# Patient Record
Sex: Male | Born: 1935 | Race: White | Hispanic: No | Marital: Married | Smoking: Never smoker
Health system: Southern US, Community
[De-identification: ages and names within clinical notes are randomized; demographics above are authoritative.]

## PROBLEM LIST (undated history)

## (undated) DIAGNOSIS — K219 Gastro-esophageal reflux disease without esophagitis: Secondary | ICD-10-CM

## (undated) DIAGNOSIS — H9192 Unspecified hearing loss, left ear: Secondary | ICD-10-CM

## (undated) DIAGNOSIS — I10 Essential (primary) hypertension: Secondary | ICD-10-CM

## (undated) DIAGNOSIS — M1990 Unspecified osteoarthritis, unspecified site: Secondary | ICD-10-CM

## (undated) DIAGNOSIS — N400 Enlarged prostate without lower urinary tract symptoms: Secondary | ICD-10-CM

## (undated) DIAGNOSIS — J189 Pneumonia, unspecified organism: Secondary | ICD-10-CM

## (undated) DIAGNOSIS — R011 Cardiac murmur, unspecified: Secondary | ICD-10-CM

## (undated) DIAGNOSIS — H9190 Unspecified hearing loss, unspecified ear: Secondary | ICD-10-CM

## (undated) DIAGNOSIS — Z952 Presence of prosthetic heart valve: Secondary | ICD-10-CM

## (undated) DIAGNOSIS — E785 Hyperlipidemia, unspecified: Secondary | ICD-10-CM

## (undated) DIAGNOSIS — J069 Acute upper respiratory infection, unspecified: Secondary | ICD-10-CM

## (undated) HISTORY — DX: Presence of prosthetic heart valve: Z95.2

## (undated) HISTORY — PX: JOINT REPLACEMENT: SHX530

## (undated) SURGERY — ARTHROPLASTY, KNEE, TOTAL
Anesthesia: Choice | Laterality: Right

---

## 1998-03-15 ENCOUNTER — Other Ambulatory Visit: Admission: RE | Admit: 1998-03-15 | Discharge: 1998-03-15 | Payer: Self-pay | Admitting: Family Medicine

## 2003-04-22 ENCOUNTER — Encounter (INDEPENDENT_AMBULATORY_CARE_PROVIDER_SITE_OTHER): Payer: Self-pay | Admitting: *Deleted

## 2003-04-22 ENCOUNTER — Ambulatory Visit (HOSPITAL_COMMUNITY): Admission: RE | Admit: 2003-04-22 | Discharge: 2003-04-22 | Payer: Self-pay | Admitting: Gastroenterology

## 2004-05-23 ENCOUNTER — Encounter: Admission: RE | Admit: 2004-05-23 | Discharge: 2004-05-23 | Payer: Self-pay | Admitting: Internal Medicine

## 2004-06-23 ENCOUNTER — Emergency Department (HOSPITAL_COMMUNITY): Admission: EM | Admit: 2004-06-23 | Discharge: 2004-06-23 | Payer: Self-pay | Admitting: Emergency Medicine

## 2004-09-26 ENCOUNTER — Encounter: Admission: RE | Admit: 2004-09-26 | Discharge: 2004-09-26 | Payer: Self-pay | Admitting: Internal Medicine

## 2005-10-02 ENCOUNTER — Ambulatory Visit: Admission: RE | Admit: 2005-10-02 | Discharge: 2005-10-02 | Payer: Self-pay | Admitting: Internal Medicine

## 2006-10-14 ENCOUNTER — Encounter: Admission: RE | Admit: 2006-10-14 | Discharge: 2006-10-14 | Payer: Self-pay | Admitting: Internal Medicine

## 2007-08-28 ENCOUNTER — Emergency Department (HOSPITAL_COMMUNITY): Admission: EM | Admit: 2007-08-28 | Discharge: 2007-08-28 | Payer: Self-pay | Admitting: Emergency Medicine

## 2011-01-19 NOTE — Op Note (Signed)
   NAME:  Andrew Bonilla, NIEMANN NO.:  [id number]   MEDICAL RECORD NO.:  [id number]                   PATIENT TYPE:  AMB   LOCATION:  ENDO                                 FACILITY:  [facility]   PHYSICIAN:  Danise Edge, M.D.                DATE OF BIRTH:  1933/07/21   DATE OF PROCEDURE:  04/22/2003  DATE OF DISCHARGE:                                 OPERATIVE REPORT   PROCEDURE PERFORMED:  Screening colonoscopy.   ENDOSCOPIST:  Charolett Bumpers, M.D.   INDICATIONS FOR PROCEDURE:  Mr. Gilliam Hawkes is a 77 year old [place] male  born 1933/03/30.  Mr. Maclin is scheduled to undergo his first  screening colonoscopy with polypectomy to prevent colon cancer.   PREMEDICATION:  Versed 5 mg, Demerol 50 mg.   DESCRIPTION OF PROCEDURE:  After obtaining informed consent, Mr. Linch was  placed in the left lateral decubitus position.  I administered intravenous  Demerol and intravenous Versed to achieve conscious sedation for the  procedure.  The patient's blood pressure, oxygen saturations and cardiac  rhythm were monitored throughout the procedure and documented in the medical  record.   Anal inspection was normal.  Digital rectal exam revealed a nonnodular  prostate.  The Olympus adult colonoscope was introduced into the rectum and  easily advanced to the cecum.  Colonic preparation for the exam today was  excellent.   Rectum:  Two diminutive sessile polyps were removed from the midrectum with  the cold biopsy forceps and submitted for pathological interpretation.   Sigmoid colon and descending colon:  Normal.   Splenic flexure:  Normal.   Transverse colon:  Normal.   Hepatic flexure:  Normal.   Ascending colon:  Normal.   Cecum and ileocecal valve:  Normal.    ASSESSMENT:  Two diminutive polyps removed from the rectum; otherwise normal  screening proctocolonoscopy to the cecum.   RECOMMENDATIONS:  Repeat colonoscopy in five years if rectal polyps  return  neoplastic pathologically.                                               Danise Edge, M.D.    MJ/MEDQ  D:  04/22/2003  T:  04/22/2003  Job:  [id number]   cc:   Georgann Housekeeper, M.D.  [address]  [place]  [place] [address]  Fax: [phone]

## 2011-09-21 DIAGNOSIS — L570 Actinic keratosis: Secondary | ICD-10-CM | POA: Diagnosis not present

## 2011-09-21 DIAGNOSIS — B351 Tinea unguium: Secondary | ICD-10-CM | POA: Diagnosis not present

## 2011-09-21 DIAGNOSIS — L821 Other seborrheic keratosis: Secondary | ICD-10-CM | POA: Diagnosis not present

## 2011-09-26 DIAGNOSIS — B351 Tinea unguium: Secondary | ICD-10-CM | POA: Diagnosis not present

## 2011-09-26 DIAGNOSIS — Z79899 Other long term (current) drug therapy: Secondary | ICD-10-CM | POA: Diagnosis not present

## 2011-12-04 DIAGNOSIS — L820 Inflamed seborrheic keratosis: Secondary | ICD-10-CM | POA: Diagnosis not present

## 2011-12-04 DIAGNOSIS — B351 Tinea unguium: Secondary | ICD-10-CM | POA: Diagnosis not present

## 2011-12-04 DIAGNOSIS — L570 Actinic keratosis: Secondary | ICD-10-CM | POA: Diagnosis not present

## 2011-12-13 DIAGNOSIS — N529 Male erectile dysfunction, unspecified: Secondary | ICD-10-CM | POA: Diagnosis not present

## 2011-12-13 DIAGNOSIS — E782 Mixed hyperlipidemia: Secondary | ICD-10-CM | POA: Diagnosis not present

## 2011-12-13 DIAGNOSIS — I6529 Occlusion and stenosis of unspecified carotid artery: Secondary | ICD-10-CM | POA: Diagnosis not present

## 2011-12-13 DIAGNOSIS — J069 Acute upper respiratory infection, unspecified: Secondary | ICD-10-CM | POA: Diagnosis not present

## 2011-12-13 DIAGNOSIS — N400 Enlarged prostate without lower urinary tract symptoms: Secondary | ICD-10-CM | POA: Diagnosis not present

## 2011-12-13 DIAGNOSIS — Z1331 Encounter for screening for depression: Secondary | ICD-10-CM | POA: Diagnosis not present

## 2011-12-13 DIAGNOSIS — I10 Essential (primary) hypertension: Secondary | ICD-10-CM | POA: Diagnosis not present

## 2011-12-13 DIAGNOSIS — K219 Gastro-esophageal reflux disease without esophagitis: Secondary | ICD-10-CM | POA: Diagnosis not present

## 2011-12-14 ENCOUNTER — Inpatient Hospital Stay (HOSPITAL_COMMUNITY)
Admission: EM | Admit: 2011-12-14 | Discharge: 2011-12-18 | DRG: 287 | Disposition: A | Discharge status: Home / Self Care | Payer: Medicare Other | Source: Ambulatory Visit | Attending: Internal Medicine | Admitting: Internal Medicine

## 2011-12-14 ENCOUNTER — Encounter (HOSPITAL_COMMUNITY): Payer: Self-pay | Admitting: *Deleted

## 2011-12-14 DIAGNOSIS — Z952 Presence of prosthetic heart valve: Secondary | ICD-10-CM | POA: Diagnosis present

## 2011-12-14 DIAGNOSIS — K5900 Constipation, unspecified: Secondary | ICD-10-CM | POA: Diagnosis present

## 2011-12-14 DIAGNOSIS — I10 Essential (primary) hypertension: Secondary | ICD-10-CM | POA: Diagnosis present

## 2011-12-14 DIAGNOSIS — E785 Hyperlipidemia, unspecified: Secondary | ICD-10-CM | POA: Diagnosis present

## 2011-12-14 DIAGNOSIS — R55 Syncope and collapse: Principal | ICD-10-CM | POA: Diagnosis present

## 2011-12-14 DIAGNOSIS — R5383 Other fatigue: Secondary | ICD-10-CM | POA: Diagnosis not present

## 2011-12-14 DIAGNOSIS — E782 Mixed hyperlipidemia: Secondary | ICD-10-CM | POA: Diagnosis present

## 2011-12-14 DIAGNOSIS — R519 Headache, unspecified: Secondary | ICD-10-CM

## 2011-12-14 DIAGNOSIS — I359 Nonrheumatic aortic valve disorder, unspecified: Secondary | ICD-10-CM | POA: Diagnosis present

## 2011-12-14 DIAGNOSIS — G319 Degenerative disease of nervous system, unspecified: Secondary | ICD-10-CM | POA: Diagnosis not present

## 2011-12-14 DIAGNOSIS — H9190 Unspecified hearing loss, unspecified ear: Secondary | ICD-10-CM | POA: Diagnosis present

## 2011-12-14 DIAGNOSIS — R5381 Other malaise: Secondary | ICD-10-CM | POA: Diagnosis not present

## 2011-12-14 DIAGNOSIS — R51 Headache: Secondary | ICD-10-CM

## 2011-12-14 DIAGNOSIS — R110 Nausea: Secondary | ICD-10-CM | POA: Diagnosis not present

## 2011-12-14 DIAGNOSIS — Z8249 Family history of ischemic heart disease and other diseases of the circulatory system: Secondary | ICD-10-CM

## 2011-12-14 DIAGNOSIS — R404 Transient alteration of awareness: Secondary | ICD-10-CM | POA: Diagnosis not present

## 2011-12-14 DIAGNOSIS — R4789 Other speech disturbances: Secondary | ICD-10-CM | POA: Diagnosis not present

## 2011-12-14 HISTORY — DX: Hyperlipidemia, unspecified: E78.5

## 2011-12-14 HISTORY — DX: Unspecified hearing loss, unspecified ear: H91.90

## 2011-12-14 HISTORY — DX: Essential (primary) hypertension: I10

## 2011-12-14 NOTE — ED Notes (Signed)
Pt from home.  Pt has been constipated, took a laxative today-had a BM.  Had abdominal pain.  Was walking down stairs and woke up face down on the floor.  Pt was later using the bathroom and had another syncopal episode.  On EMS arrival, pt turned grey, diaphoretic-near syncopal when pt stood up-unable to obtain BP at that time.  324 asa given.  Denies pain-reports nausea and dizziness when standing.  Vitals:  145/53, HR 70.

## 2011-12-15 ENCOUNTER — Encounter (HOSPITAL_COMMUNITY): Payer: Self-pay | Admitting: *Deleted

## 2011-12-15 ENCOUNTER — Emergency Department (HOSPITAL_COMMUNITY): Payer: Medicare Other

## 2011-12-15 DIAGNOSIS — R519 Headache, unspecified: Secondary | ICD-10-CM

## 2011-12-15 DIAGNOSIS — R05 Cough: Secondary | ICD-10-CM | POA: Diagnosis not present

## 2011-12-15 DIAGNOSIS — R51 Headache: Secondary | ICD-10-CM

## 2011-12-15 DIAGNOSIS — H9190 Unspecified hearing loss, unspecified ear: Secondary | ICD-10-CM | POA: Diagnosis not present

## 2011-12-15 DIAGNOSIS — K5900 Constipation, unspecified: Secondary | ICD-10-CM | POA: Diagnosis present

## 2011-12-15 DIAGNOSIS — R110 Nausea: Secondary | ICD-10-CM | POA: Diagnosis not present

## 2011-12-15 DIAGNOSIS — I10 Essential (primary) hypertension: Secondary | ICD-10-CM | POA: Diagnosis not present

## 2011-12-15 DIAGNOSIS — Z8249 Family history of ischemic heart disease and other diseases of the circulatory system: Secondary | ICD-10-CM | POA: Diagnosis not present

## 2011-12-15 DIAGNOSIS — I359 Nonrheumatic aortic valve disorder, unspecified: Secondary | ICD-10-CM | POA: Diagnosis not present

## 2011-12-15 DIAGNOSIS — R55 Syncope and collapse: Principal | ICD-10-CM | POA: Diagnosis present

## 2011-12-15 DIAGNOSIS — R059 Cough, unspecified: Secondary | ICD-10-CM | POA: Diagnosis not present

## 2011-12-15 DIAGNOSIS — G319 Degenerative disease of nervous system, unspecified: Secondary | ICD-10-CM | POA: Diagnosis not present

## 2011-12-15 DIAGNOSIS — Z952 Presence of prosthetic heart valve: Secondary | ICD-10-CM | POA: Diagnosis present

## 2011-12-15 DIAGNOSIS — E782 Mixed hyperlipidemia: Secondary | ICD-10-CM | POA: Diagnosis present

## 2011-12-15 DIAGNOSIS — R4789 Other speech disturbances: Secondary | ICD-10-CM | POA: Diagnosis not present

## 2011-12-15 DIAGNOSIS — E785 Hyperlipidemia, unspecified: Secondary | ICD-10-CM | POA: Diagnosis not present

## 2011-12-15 LAB — URINALYSIS, ROUTINE W REFLEX MICROSCOPIC
Glucose, UA: NEGATIVE mg/dL
Leukocytes, UA: NEGATIVE
pH: 6.5 (ref 5.0–8.0)

## 2011-12-15 LAB — POCT I-STAT, CHEM 8
Chloride: 99 mEq/L (ref 96–112)
Creatinine, Ser: 1 mg/dL (ref 0.50–1.35)
Glucose, Bld: 135 mg/dL — ABNORMAL HIGH (ref 70–99)
Potassium: 4.1 mEq/L (ref 3.5–5.1)

## 2011-12-15 LAB — COMPREHENSIVE METABOLIC PANEL
AST: 27 U/L (ref 0–37)
Albumin: 4.1 g/dL (ref 3.5–5.2)
Calcium: 9.7 mg/dL (ref 8.4–10.5)
Chloride: 95 mEq/L — ABNORMAL LOW (ref 96–112)
Creatinine, Ser: 1.05 mg/dL (ref 0.50–1.35)

## 2011-12-15 LAB — CBC
MCH: 30.9 pg (ref 26.0–34.0)
MCH: 31.2 pg (ref 26.0–34.0)
MCV: 87 fL (ref 78.0–100.0)
MCV: 88 fL (ref 78.0–100.0)
Platelets: 271 10*3/uL (ref 150–400)
Platelets: 281 10*3/uL (ref 150–400)
RBC: 4.24 MIL/uL (ref 4.22–5.81)
RDW: 13.1 % (ref 11.5–15.5)
RDW: 13.4 % (ref 11.5–15.5)
WBC: 14.9 10*3/uL — ABNORMAL HIGH (ref 4.0–10.5)

## 2011-12-15 LAB — OCCULT BLOOD, POC DEVICE: Fecal Occult Bld: POSITIVE

## 2011-12-15 LAB — BASIC METABOLIC PANEL
CO2: 25 mEq/L (ref 19–32)
Calcium: 9.2 mg/dL (ref 8.4–10.5)
Creatinine, Ser: 0.84 mg/dL (ref 0.50–1.35)

## 2011-12-15 MED ORDER — ENOXAPARIN SODIUM 40 MG/0.4ML ~~LOC~~ SOLN
40.0000 mg | SUBCUTANEOUS | Status: DC
  Administered 2011-12-15: 40 mg via SUBCUTANEOUS
  Filled 2011-12-15 (×2): qty 0.4

## 2011-12-15 MED ORDER — BENZONATATE 100 MG PO CAPS
100.0000 mg | ORAL_CAPSULE | Freq: Three times a day (TID) | ORAL | Status: DC | PRN
  Administered 2011-12-15 – 2011-12-17 (×5): 100 mg via ORAL
  Filled 2011-12-15 (×6): qty 1

## 2011-12-15 MED ORDER — HYDROCHLOROTHIAZIDE 12.5 MG PO CAPS
12.5000 mg | ORAL_CAPSULE | Freq: Every day | ORAL | Status: AC
  Administered 2011-12-15 – 2011-12-16 (×2): 12.5 mg via ORAL
  Filled 2011-12-15 (×2): qty 1

## 2011-12-15 MED ORDER — POTASSIUM CHLORIDE IN NACL 20-0.9 MEQ/L-% IV SOLN
INTRAVENOUS | Status: DC
  Administered 2011-12-15: 05:00:00 via INTRAVENOUS
  Filled 2011-12-15 (×2): qty 1000

## 2011-12-15 MED ORDER — BENZONATATE 100 MG PO CAPS: 100.0000 mg | ORAL_CAPSULE | Freq: Three times a day (TID) | ORAL | Status: DC

## 2011-12-15 MED ORDER — GUAIFENESIN-CODEINE 100-10 MG/5ML PO SOLN
10.0000 mL | ORAL | Status: DC | PRN
  Administered 2011-12-15: 10 mL via ORAL
  Filled 2011-12-15 (×3): qty 5

## 2011-12-15 MED ORDER — MAGNESIUM CITRATE PO SOLN
1.0000 | Freq: Once | ORAL | Status: AC
  Administered 2011-12-15: 1 via ORAL
  Filled 2011-12-15: qty 296

## 2011-12-15 MED ORDER — SODIUM CHLORIDE 0.9 % IV SOLN
INTRAVENOUS | Status: DC
  Administered 2011-12-15 (×2): via INTRAVENOUS

## 2011-12-15 MED ORDER — GUAIFENESIN-CODEINE 100-10 MG/5ML PO SOLN: 10.0000 mL | Freq: Four times a day (QID) | ORAL | Status: AC | PRN

## 2011-12-15 MED ORDER — POLYETHYLENE GLYCOL 3350 17 G PO PACK
17.0000 g | PACK | Freq: Every day | ORAL | Status: DC
  Administered 2011-12-15 – 2011-12-18 (×3): 17 g via ORAL
  Filled 2011-12-15 (×4): qty 1

## 2011-12-15 MED ORDER — IRBESARTAN 75 MG PO TABS
75.0000 mg | ORAL_TABLET | Freq: Every day | ORAL | Status: DC
  Administered 2011-12-15 – 2011-12-16 (×2): 75 mg via ORAL
  Filled 2011-12-15 (×3): qty 1

## 2011-12-15 MED ORDER — VALSARTAN-HYDROCHLOROTHIAZIDE 80-12.5 MG PO TABS
1.0000 | ORAL_TABLET | Freq: Every day | ORAL | Status: DC
Start: 2011-12-15 — End: 2011-12-15

## 2011-12-15 MED ORDER — DOCUSATE SODIUM 100 MG PO CAPS
100.0000 mg | ORAL_CAPSULE | Freq: Every day | ORAL | Status: DC
  Administered 2011-12-16 – 2011-12-17 (×3): 100 mg via ORAL
  Filled 2011-12-15 (×5): qty 1

## 2011-12-15 MED ORDER — ATORVASTATIN CALCIUM 40 MG PO TABS
40.0000 mg | ORAL_TABLET | Freq: Every day | ORAL | Status: DC
Start: 2011-12-15 — End: 2011-12-18
  Administered 2011-12-15 – 2011-12-18 (×4): 40 mg via ORAL
  Filled 2011-12-15 (×4): qty 1

## 2011-12-15 MED ORDER — POLYETHYLENE GLYCOL 3350 17 G PO PACK: 17.0000 g | PACK | Freq: Every day | ORAL | Status: AC

## 2011-12-15 MED ORDER — BISACODYL 10 MG RE SUPP: 10.0000 mg | RECTAL | Status: DC | PRN

## 2011-12-15 MED ORDER — PANTOPRAZOLE SODIUM 40 MG PO TBEC
40.0000 mg | DELAYED_RELEASE_TABLET | Freq: Every day | ORAL | Status: DC
  Administered 2011-12-15 – 2011-12-18 (×4): 40 mg via ORAL
  Filled 2011-12-15 (×4): qty 1

## 2011-12-15 NOTE — Progress Notes (Signed)
Admitted pt to rm [id number] from ED via stretcher, pt alert and oriented, denied pain at this time. Oriented to room, call bell placed within reach, SR on heart monitor, admission assessment done, orders carried out, safety precaution initiated, bed alarm on. Family at bedside. Will continue to monitor.

## 2011-12-15 NOTE — Progress Notes (Signed)
Patient seen and examined this morning, admitted by Dr. Haroldine Laws on 12/15/2011, for syncope - CT head was negative, POC troponin was negative, chest x-ray was negative - Ordered a 2-D echocardiogram given recurrent syncope with constipation - 2-D echo revealed mild concentric LVH, EF 50-55% with male basal to mid inferior hypokinesis, severe aortic stenosis, calculated AVA 0.9-1 cm2 - Discussed in detail with the patient and his wife in the room. Patient never had any cardiac workup in the past and with no cardiac history. Patient was not aware of aortic stenosis before.  - Discussed with Dr. Rennis Golden, who graciously accepted to evaluate the patient further recommendations.    Orma Cheetham M.D. [facility] 12/15/2011, 1:07 PM  Pager: [phone]

## 2011-12-15 NOTE — H&P (Signed)
PCP:   Georgann Housekeeper, MD, MD   Chief Complaint:  Syncope and constipation  HPI: This is a 78 year old gentleman who states that he does have a history of bowel issues. However he's been severely constipated in the past 3 days. Today he spent all day trying to go to bathroom without success, he syncopized while while attempting to go. His wife later went out and bought a suppository which he used with great success. He went to sleep and woke 3 hours later, while doing chores around the house he again syncopized. He wasn't out for any significant length of time. The patient denies any cough, palpitations, lightheadedness. In addition the patient has a significant persistent cough. It is nonproductive. This has been present for approximately 4 weeks. He does not report of any fevers, chills. He has had nausea associated with this. His wife brought him to the ER after he syncopized second time. History provided by the patient and his wife and O2 for a bedside.  Review of Systems: Positives bolded   anorexia, fever, weight loss,, vision loss, decreased hearing, hoarseness, chest pain, syncope, dyspnea on exertion, peripheral edema, balance deficits, hemoptysis, abdominal pain, melena, hematochezia, severe indigestion/heartburn, hematuria, incontinence, genital sores, muscle weakness, suspicious skin lesions, transient blindness, difficulty walking, depression, unusual weight change, abnormal bleeding, enlarged lymph nodes, angioedema, and breast masses.  Past Medical History: Past Medical History  Diagnosis Date  . Hyperlipemia   . Hypertension   . Constipation   . Hard of hearing   . Generalized headaches    History reviewed. No pertinent past surgical history.  Medications: Prior to Admission medications   Medication Sig Start Date End Date Taking? Authorizing Provider  atorvastatin (LIPITOR) 40 MG tablet Take 40 mg by mouth daily.   Yes Historical Provider, MD  bisacodyl (DULCOLAX) 10 MG  suppository Place 10 mg rectally as needed.   Yes Historical Provider, MD  esomeprazole (NEXIUM) 40 MG capsule Take 40 mg by mouth daily before breakfast.   Yes Historical Provider, MD  Sennosides (EX-LAX PO) Take 1 tablet by mouth daily.   Yes Historical Provider, MD  valsartan-hydrochlorothiazide (DIOVAN-HCT) 80-12.5 MG per tablet Take 1 tablet by mouth daily.   Yes Historical Provider, MD  azithromycin (ZITHROMAX) 250 MG tablet Take 250 mg by mouth daily.    Historical Provider, MD    Allergies:  No Known Allergies  Social History:  reports that he has quit smoking. He does not have any smokeless tobacco history on file. He reports that he does not drink alcohol or use illicit drugs.  Family History: Family History  Problem Relation Age of Onset  . Coronary artery disease    . Colon cancer      Physical Exam: Filed Vitals:   12/14/11 2356 12/15/11 0030 12/15/11 0033 12/15/11 0035  BP: 141/52 101/71 120/62 119/66  Pulse: 72 63 85 88  Temp: 97.5 F (36.4 C)     TempSrc: Oral     Resp: 19     SpO2: 97% 97% 95% 100%    General:  Alert and oriented times three, well developed and nourished, no acute distress Eyes: PERRLA, pink conjunctiva, no scleral icterus ENT: Moist oral mucosa, neck supple, no thyromegaly Lungs: clear to ascultation, no wheeze, no crackles, no use of accessory muscles Cardiovascular: regular rate and rhythm, no regurgitation, no gallops, no murmurs. No carotid bruits, no JVD Abdomen: soft, positive BS, non-tender, non-distended, no organomegaly, not an acute abdomen GU: not examined Neuro: CN II - XII  grossly intact, sensation intact Musculoskeletal: strength 5/5 all extremities, no clubbing, cyanosis or edema Skin: no rash, no subcutaneous crepitation, no decubitus Psych: appropriate patient   Labs on Admission:   [facility] 12/15/11 0105 12/15/11 0049  NA 136 133*  K 4.1 4.0  CL 99 95*  CO2 -- 27  GLUCOSE 135* 135*  BUN 17 16  CREATININE 1.00  1.05  CALCIUM -- 9.7  MG -- --  PHOS -- --    Basename 12/15/11 0049  AST 27  ALT 29  ALKPHOS 64  BILITOT 1.0  PROT 7.3  ALBUMIN 4.1   No results found for this basename: LIPASE:2,AMYLASE:2 in the last 72 hours  Basename 12/15/11 0105 12/15/11 0049  WBC -- 14.9*  NEUTROABS -- --  HGB 15.0 14.4  HCT 44.0 40.2  MCV -- 87.0  PLT -- 271   No results found for this basename: CKTOTAL:3,CKMB:3,CKMBINDEX:3,TROPONINI:3 in the last 72 hours No components found with this basename: POCBNP:3 No results found for this basename: DDIMER:2 in the last 72 hours No results found for this basename: HGBA1C:2 in the last 72 hours No results found for this basename: CHOL:2,HDL:2,LDLCALC:2,TRIG:2,CHOLHDL:2,LDLDIRECT:2 in the last 72 hours No results found for this basename: TSH,T4TOTAL,FREET3,T3FREE,THYROIDAB in the last 72 hours No results found for this basename: VITAMINB12:2,FOLATE:2,FERRITIN:2,TIBC:2,IRON:2,RETICCTPCT:2 in the last 72 hours  Micro Results: No results found for this or any previous visit (from the past 240 hour(s)).   Radiological Exams on Admission: Dg Abd Acute W/chest  12/15/2011  *RADIOLOGY REPORT*  Clinical Data: Constipation, nausea, syncope  ACUTE ABDOMEN SERIES (ABDOMEN 2 VIEW & CHEST 1 VIEW)  Comparison: None.  Findings: Lungs are essentially clear. No pleural effusion or pneumothorax.  Cardiomediastinal silhouette is within normal limits.  Nonobstructive bowel gas pattern.  No evidence of free air under the diaphragm on the upright view.  Mild degenerative changes of the visualized thoracolumbar spine.  IMPRESSION: No evidence of acute cardiopulmonary disease.  No evidence of small bowel obstruction or free air.  Original Report Authenticated By: Charline Bills, M.D.    Assessment/Plan Present on Admission:  .Syncope and collapse Severe constipation Admit to telemetry Likely due to patient's severe constipation and persistent viral illness Magnesium citrate and  MiraLAX ordered and scheduled  Enema when necessary CT head ordered and results are pending  urinalysis also ordered results pending Hypertension Hyperlipidemia Hard of hearing Stable resume home medications   Full code DVT prophylaxis T9/Dr. Nadara Mustard, Ahyan Kreeger 12/15/2011, 3:24 AM

## 2011-12-15 NOTE — Progress Notes (Signed)
  Echocardiogram 2D Echocardiogram has been performed.  Andrew Bonilla 12/15/2011, 12:07 PM

## 2011-12-15 NOTE — Consult Note (Signed)
Reason for Consult: Syncope  Requesting Physician: [facility]  HPI: This is a 78 y.o. male with a past medical history significant for HTN and dyslipidemia. He has no prior history of CAD, syncope, or MI. His wife does note he has had some early exertional fatigue over the last 6 months. He has some problems with constipation the last 2-3 days. Yesterday he had two syncopal spells, (without injury). The first was attempting to have a bowel movement, and the second was after urinating. Echo here shows severe AS with AVA of 0.9 and peak gradient of . He denies any chest pain. He does admit to some SOB yesterday and his wife says he was diaphoretic and pale.  PMHx:  Past Medical History  Diagnosis Date  . Hyperlipemia   . Hypertension   . Constipation   . Hard of hearing   . Generalized headaches    History reviewed. No pertinent past surgical history.  FAMHx: Family History  Problem Relation Age of Onset  . Coronary artery disease Father 36    MI/smoker  . Colon cancer      SOCHx:  reports that he has never smoked. He does not have any smokeless tobacco history on file. He reports that he does not drink alcohol or use illicit drugs.  ALLERGIES: No Known Allergies  ROS: no history of past syncope  HOME MEDICATIONS: Prescriptions prior to admission  Medication Sig Dispense Refill  . atorvastatin (LIPITOR) 40 MG tablet Take 40 mg by mouth daily.      . bisacodyl (DULCOLAX) 10 MG suppository Place 10 mg rectally as needed.      Marland Kitchen esomeprazole (NEXIUM) 40 MG capsule Take 40 mg by mouth daily before breakfast.      . Sennosides (EX-LAX PO) Take 1 tablet by mouth daily.      . valsartan-hydrochlorothiazide (DIOVAN-HCT) 80-12.5 MG per tablet Take 1 tablet by mouth daily.      Marland Kitchen DISCONTD: azithromycin (ZITHROMAX) 250 MG tablet Take 250 mg by mouth daily.        HOSPITAL MEDICATIONS: I have reviewed the patient's current medications.  VITALS: Blood pressure 158/74, pulse  68, temperature 97.7 F (36.5 C), temperature source Oral, resp. rate 20, height 5\' 7"  (1.702 m), weight 106.4 kg (234 lb 9.1 oz), SpO2 98.00%.  PHYSICAL EXAM: General appearance: alert, cooperative, no distress and moderately obese Neck: no JVD, supple, symmetrical, trachea midline, thyroid not enlarged, symmetric, no tenderness/mass/nodules and transmitted AS murmur Lungs: clear to auscultation bilaterally Heart: regular rate and rhythm and 2/6/ systolic murmur LSB Abdomen: obese Extremities: extremities normal, atraumatic, no cyanosis or edema Pulses: 2+ and symmetric Skin: cool and dry Neurologic: Grossly normal  LABS: Results for orders placed during the hospital encounter of 12/14/11 (from the past 48 hour(s))  CBC     Status: Abnormal   Collection Time   12/15/11 12:49 AM      Component Value Range Comment   WBC 14.9 (*) 4.0 - 10.5 (K/uL)    RBC 4.62  4.22 - 5.81 (MIL/uL)    Hemoglobin 14.4  13.0 - 17.0 (g/dL)    HCT 16.1  09.6 - 04.5 (%)    MCV 87.0  78.0 - 100.0 (fL)    MCH 31.2  26.0 - 34.0 (pg)    MCHC 35.8  30.0 - 36.0 (g/dL)    RDW 40.9  81.1 - 91.4 (%)    Platelets 271  150 - 400 (K/uL)   COMPREHENSIVE METABOLIC PANEL  Status: Abnormal   Collection Time   12/15/11 12:49 AM      Component Value Range Comment   Sodium 133 (*) 135 - 145 (mEq/L)    Potassium 4.0  3.5 - 5.1 (mEq/L)    Chloride 95 (*) 96 - 112 (mEq/L)    CO2 27  19 - 32 (mEq/L)    Glucose, Bld 135 (*) 70 - 99 (mg/dL)    BUN 16  6 - 23 (mg/dL)    Creatinine, Ser 4.09  0.50 - 1.35 (mg/dL)    Calcium 9.7  8.4 - 10.5 (mg/dL)    Total Protein 7.3  6.0 - 8.3 (g/dL)    Albumin 4.1  3.5 - 5.2 (g/dL)    AST 27  0 - 37 (U/L)    ALT 29  0 - 53 (U/L)    Alkaline Phosphatase 64  39 - 117 (U/L)    Total Bilirubin 1.0  0.3 - 1.2 (mg/dL)    GFR calc non Af Amer 66 (*) >90 (mL/min)    GFR calc Af Amer 77 (*) >90 (mL/min)   POCT I-STAT TROPONIN I     Status: Normal   Collection Time   12/15/11  1:04 AM       Component Value Range Comment   Troponin i, poc 0.02  0.00 - 0.08 (ng/mL)    Comment 3            POCT I-STAT, CHEM 8     Status: Abnormal   Collection Time   12/15/11  1:05 AM      Component Value Range Comment   Sodium 136  135 - 145 (mEq/L)    Potassium 4.1  3.5 - 5.1 (mEq/L)    Chloride 99  96 - 112 (mEq/L)    BUN 17  6 - 23 (mg/dL)    Creatinine, Ser 8.11  0.50 - 1.35 (mg/dL)    Glucose, Bld 914 (*) 70 - 99 (mg/dL)    Calcium, Ion 7.82  1.12 - 1.32 (mmol/L)    TCO2 26  0 - 100 (mmol/L)    Hemoglobin 15.0  13.0 - 17.0 (g/dL)    HCT 95.6  21.3 - 08.6 (%)   OCCULT BLOOD, POC DEVICE     Status: Normal   Collection Time   12/15/11  2:22 AM      Component Value Range Comment   Fecal Occult Bld POSITIVE     URINALYSIS, ROUTINE W REFLEX MICROSCOPIC     Status: Abnormal   Collection Time   12/15/11  3:08 AM      Component Value Range Comment   Color, Urine AMBER (*) YELLOW  BIOCHEMICALS MAY BE AFFECTED BY COLOR   APPearance CLEAR  CLEAR     Specific Gravity, Urine 1.023  1.005 - 1.030     pH 6.5  5.0 - 8.0     Glucose, UA NEGATIVE  NEGATIVE (mg/dL)    Hgb urine dipstick NEGATIVE  NEGATIVE     Bilirubin Urine NEGATIVE  NEGATIVE     Ketones, ur NEGATIVE  NEGATIVE (mg/dL)    Protein, ur NEGATIVE  NEGATIVE (mg/dL)    Urobilinogen, UA 1.0  0.0 - 1.0 (mg/dL)    Nitrite NEGATIVE  NEGATIVE     Leukocytes, UA NEGATIVE  NEGATIVE  MICROSCOPIC NOT DONE ON URINES WITH NEGATIVE PROTEIN, BLOOD, LEUKOCYTES, NITRITE, OR GLUCOSE <1000 mg/dL.  CBC     Status: Abnormal   Collection Time   12/15/11  9:15 AM  Component Value Range Comment   WBC 10.8 (*) 4.0 - 10.5 (K/uL)    RBC 4.24  4.22 - 5.81 (MIL/uL)    Hemoglobin 13.1  13.0 - 17.0 (g/dL)    HCT 40.9 (*) 81.1 - 52.0 (%)    MCV 88.0  78.0 - 100.0 (fL)    MCH 30.9  26.0 - 34.0 (pg)    MCHC 35.1  30.0 - 36.0 (g/dL)    RDW 91.4  78.2 - 95.6 (%)    Platelets 281  150 - 400 (K/uL)   BASIC METABOLIC PANEL     Status: Abnormal   Collection Time    12/15/11  9:15 AM      Component Value Range Comment   Sodium 136  135 - 145 (mEq/L)    Potassium 4.3  3.5 - 5.1 (mEq/L)    Chloride 101  96 - 112 (mEq/L)    CO2 25  19 - 32 (mEq/L)    Glucose, Bld 131 (*) 70 - 99 (mg/dL)    BUN 13  6 - 23 (mg/dL)    Creatinine, Ser 2.13  0.50 - 1.35 (mg/dL)    Calcium 9.2  8.4 - 10.5 (mg/dL)    GFR calc non Af Amer 82 (*) >90 (mL/min)    GFR calc Af Amer >90  >90 (mL/min)     IMAGING: Ct Head Wo Contrast  12/15/2011  *RADIOLOGY REPORT*  Clinical Data: Syncope.  Slurred speech.  CT HEAD WITHOUT CONTRAST  Technique:  Contiguous axial images were obtained from the base of the skull through the vertex without contrast.  Comparison: None.  Findings: There is no evidence of intracranial hemorrhage, brain edema or other signs of acute infarction.  There is no evidence of intracranial mass lesion or mass effect.  No abnormal extra-axial fluid collections are identified.  Mild cerebral atrophy and ventriculomegaly are noted.  No other intracranial abnormality identified.  No evidence of skull fracture or other bone lesions.  IMPRESSION:  1.  No acute intracranial abnormality. 2.  Mild diffuse cerebral atrophy.  Original Report Authenticated By: Danae Orleans, M.D.   Dg Abd Acute W/chest  12/15/2011  *RADIOLOGY REPORT*  Clinical Data: Constipation, nausea, syncope  ACUTE ABDOMEN SERIES (ABDOMEN 2 VIEW & CHEST 1 VIEW)  Comparison: None.  Findings: Lungs are essentially clear. No pleural effusion or pneumothorax.  Cardiomediastinal silhouette is within normal limits.  Nonobstructive bowel gas pattern.  No evidence of free air under the diaphragm on the upright view.  Mild degenerative changes of the visualized thoracolumbar spine.  IMPRESSION: No evidence of acute cardiopulmonary disease.  No evidence of small bowel obstruction or free air.  Original Report Authenticated By: Charline Bills, M.D.    IMPRESSION: Principal Problem:  *Syncope and collapse Active  Problems:  Hypertension  Aortic stenosis, severe by echo  Hyperlipidemia  Constipation   RECOMMENDATION: Dr Rennis Golden to see. He will need cath. Avoid straining or hypotension.  Time Spent Directly with Patient: 35 minutes  Larence Thone K 12/15/2011, 1:38 PM

## 2011-12-15 NOTE — Progress Notes (Signed)
Persistent cough, none productive.  Client is SOB with exertion, although lung sounds is essentially clear and diminished.  Echo notified of need for test this morning.  Son at bedside.  Thanks, [employer]

## 2011-12-15 NOTE — ED Notes (Signed)
Pt to CT scan.

## 2011-12-15 NOTE — ED Provider Notes (Signed)
History     CSN: [id number]  Arrival date & time 12/14/11  2356   First MD Initiated Contact with Patient 12/15/11 0007      Chief Complaint  Patient presents with  . Loss of Consciousness    (Consider location/radiation/quality/duration/timing/severity/associated sxs/prior treatment) HPI History provided by patient and wife bedside. Patient has history of constipation and no bowel movement for last 3 days. Tonight at home on the commode unable to have a bowel movement and when he got up had a syncopal event. He came to deny any complaints. Patient had his wife gave him something to drink and the patient took a nap for about 2 hours. When he woke up, he had ambulated for a few minutes and went to walk downstairs and while walking down the steps had another syncopal event. With a second event, no abdominal pain or cramping. No chest pain or shortness of breath. No presyncopal symptoms. No history of arrhythmias. In the emergency department patient denies any current complaints. No history of syncope in the past.mod in severity Past Medical History  Diagnosis Date  . Hyperlipemia   . Hypertension   . Constipation     History reviewed. No pertinent past surgical history.  History reviewed. No pertinent family history.  History  Substance Use Topics  . Smoking status: Not on file  . Smokeless tobacco: Not on file  . Alcohol Use:       Review of Systems  Constitutional: Negative for fever and chills.  HENT: Negative for neck pain and neck stiffness.   Eyes: Negative for pain.  Respiratory: Negative for shortness of breath.   Cardiovascular: Negative for chest pain.  Gastrointestinal: Positive for constipation.  Genitourinary: Negative for dysuria.  Musculoskeletal: Negative for back pain.  Skin: Negative for rash.  Neurological: Positive for syncope. Negative for seizures and headaches.  All other systems reviewed and are negative.    Allergies  Review of patient's  allergies indicates no known allergies.  Home Medications   Current Outpatient Rx  Name Route Sig Dispense Refill  . ATORVASTATIN CALCIUM 40 MG PO TABS Oral Take 40 mg by mouth daily.    Marland Kitchen BISACODYL 10 MG RE SUPP Rectal Place 10 mg rectally as needed.    Marland Kitchen ESOMEPRAZOLE MAGNESIUM 40 MG PO CPDR Oral Take 40 mg by mouth daily before breakfast.    . EX-LAX PO Oral Take 1 tablet by mouth daily.    Marland Kitchen VALSARTAN-HYDROCHLOROTHIAZIDE 80-12.5 MG PO TABS Oral Take 1 tablet by mouth daily.    . AZITHROMYCIN 250 MG PO TABS Oral Take 250 mg by mouth daily.      BP 119/66  Pulse 88  Temp(Src) 97.5 F (36.4 C) (Oral)  Resp 19  SpO2 100%  Physical Exam  Constitutional: He is oriented to person, place, and time. He appears well-developed and well-nourished.  HENT:  Head: Normocephalic and atraumatic.  Eyes: Conjunctivae and EOM are normal. Pupils are equal, round, and reactive to light.  Neck: Trachea normal. Neck supple. No thyromegaly present.  Cardiovascular: Normal rate, regular rhythm, S1 normal, S2 normal and normal pulses.     No systolic murmur is present   No diastolic murmur is present  Pulses:      Radial pulses are 2+ on the right side, and 2+ on the left side.  Pulmonary/Chest: Effort normal and breath sounds normal. He has no wheezes. He has no rhonchi. He has no rales. He exhibits no tenderness.  Abdominal: Soft. Normal appearance and  bowel sounds are normal. There is no tenderness. There is no CVA tenderness and negative Murphy's sign.  Musculoskeletal:       BLE:s Calves nontender, no cords or erythema, negative Homans sign  Neurological: He is alert and oriented to person, place, and time. He has normal strength. No cranial nerve deficit or sensory deficit. GCS eye subscore is 4. GCS verbal subscore is 5. GCS motor subscore is 6.  Skin: Skin is warm and dry. No rash noted. He is not diaphoretic.  Psychiatric: His speech is normal.       Cooperative and appropriate    ED  Course  Procedures (including critical care time)  Labs Reviewed  CBC - Abnormal; Notable for the following:    WBC 14.9 (*)    All other components within normal limits  COMPREHENSIVE METABOLIC PANEL - Abnormal; Notable for the following:    Sodium 133 (*)    Chloride 95 (*)    Glucose, Bld 135 (*)    GFR calc non Af Amer 66 (*)    GFR calc Af Amer 77 (*)    All other components within normal limits  POCT I-STAT, CHEM 8 - Abnormal; Notable for the following:    Glucose, Bld 135 (*)    All other components within normal limits  POCT I-STAT TROPONIN I  OCCULT BLOOD, POC DEVICE  URINALYSIS, ROUTINE W REFLEX MICROSCOPIC   Dg Abd Acute W/chest  12/15/2011  *RADIOLOGY REPORT*  Clinical Data: Constipation, nausea, syncope  ACUTE ABDOMEN SERIES (ABDOMEN 2 VIEW & CHEST 1 VIEW)  Comparison: None.  Findings: Lungs are essentially clear. No pleural effusion or pneumothorax.  Cardiomediastinal silhouette is within normal limits.  Nonobstructive bowel gas pattern.  No evidence of free air under the diaphragm on the upright view.  Mild degenerative changes of the visualized thoracolumbar spine.  IMPRESSION: No evidence of acute cardiopulmonary disease.  No evidence of small bowel obstruction or free air.  Original Report Authenticated By: Charline Bills, M.D.    Date: 12/15/2011  Rate: 66  Rhythm: normal sinus rhythm  QRS Axis: normal  Intervals: normal  ST/T Wave abnormalities: nonspecific ST/T changes  Conduction Disutrbances:none  Narrative Interpretation:   Old EKG Reviewed: none available   1. Syncope    2:31 AM case discussed with [facility] who agrees to evaluation for admit  No change on repeat exam. MDM   syncope x2 at home. No arrhythmia with cardiac monitoring in the ED. Labs obtained and reviewed as above. Medicine consult for admission        Sunnie Nielsen, MD 12/15/11 [phone]

## 2011-12-15 NOTE — Consult Note (Signed)
Pt. Seen and examined. Agree with the NP/PA-C note as written.  Pleasant 78 yo male with a history of HTN, HPL, but no significant cardiac history. He has been dizzy on and off for several weeks and he has been "slowing down", per his wife. He had 2 syncopal episodes prior to admission.  Their house was hot (no a/c), he was dehydrated and strained to move his bowels once.  He says that he was never told that he has a heart murmur.  I was asked to read his echo today prior to discharge and noted that he has severe aortic stenosis and a probable inferior wall motion abnormality.  I had a lengthy discussion with the family and recommend LHC on Monday.  Based on the results, we can more accurately predict therapy. The family expressed possible desire to go to an academic medical center for surgery. I have re-assured them about the quality of care at [facility] and that the Valve/CABG outcome data is excellent.  Will follow along with you. Thanks for the consult.  Chrystie Nose, MD, [facility] Attending Cardiologist [facility]

## 2011-12-15 NOTE — ED Notes (Signed)
Assumed care of pt.  No dsitress noted. Pt resting, family at bedside.  Pt denies needs.

## 2011-12-16 DIAGNOSIS — I10 Essential (primary) hypertension: Secondary | ICD-10-CM | POA: Diagnosis not present

## 2011-12-16 DIAGNOSIS — R55 Syncope and collapse: Secondary | ICD-10-CM | POA: Diagnosis not present

## 2011-12-16 DIAGNOSIS — K5900 Constipation, unspecified: Secondary | ICD-10-CM | POA: Diagnosis not present

## 2011-12-16 DIAGNOSIS — R059 Cough, unspecified: Secondary | ICD-10-CM | POA: Diagnosis not present

## 2011-12-16 DIAGNOSIS — R05 Cough: Secondary | ICD-10-CM | POA: Diagnosis not present

## 2011-12-16 MED ORDER — SODIUM CHLORIDE 0.9 % IJ SOLN: 3.0000 mL | INTRAMUSCULAR | Status: DC | PRN

## 2011-12-16 MED ORDER — SODIUM CHLORIDE 0.9 % IV SOLN
250.0000 mL | INTRAVENOUS | Status: DC | PRN
  Administered 2011-12-16: 250 mL via INTRAVENOUS

## 2011-12-16 MED ORDER — HYDROCHLOROTHIAZIDE 12.5 MG PO CAPS
12.5000 mg | ORAL_CAPSULE | Freq: Every day | ORAL | Status: DC
  Administered 2011-12-18: 12.5 mg via ORAL
  Filled 2011-12-16: qty 1

## 2011-12-16 MED ORDER — ASPIRIN 81 MG PO CHEW
324.0000 mg | CHEWABLE_TABLET | ORAL | Status: AC
  Administered 2011-12-17: 324 mg via ORAL
  Filled 2011-12-16: qty 4

## 2011-12-16 MED ORDER — ASPIRIN EC 325 MG PO TBEC
325.0000 mg | DELAYED_RELEASE_TABLET | Freq: Every day | ORAL | Status: DC
  Administered 2011-12-18: 325 mg via ORAL
  Filled 2011-12-16: qty 1

## 2011-12-16 MED ORDER — ALPRAZOLAM 0.25 MG PO TABS: 0.2500 mg | ORAL_TABLET | Freq: Three times a day (TID) | ORAL | Status: DC | PRN

## 2011-12-16 MED ORDER — DIAZEPAM 5 MG PO TABS
5.0000 mg | ORAL_TABLET | ORAL | Status: AC
Start: 2011-12-17 — End: 2011-12-17
  Administered 2011-12-17: 5 mg via ORAL
  Filled 2011-12-16: qty 1

## 2011-12-16 MED ORDER — ASPIRIN 325 MG PO TABS
325.0000 mg | ORAL_TABLET | Freq: Every day | ORAL | Status: AC
  Administered 2011-12-16: 325 mg via ORAL
  Filled 2011-12-16: qty 1

## 2011-12-16 MED ORDER — SODIUM CHLORIDE 0.9 % IV SOLN
1.0000 mL/kg/h | INTRAVENOUS | Status: DC
  Administered 2011-12-16 – 2011-12-17 (×2): 1 mL/kg/h via INTRAVENOUS

## 2011-12-16 MED ORDER — ACETAMINOPHEN 325 MG PO TABS: 650.0000 mg | ORAL_TABLET | Freq: Four times a day (QID) | ORAL | Status: DC | PRN

## 2011-12-16 MED ORDER — ZOLPIDEM TARTRATE 5 MG PO TABS: 5.0000 mg | ORAL_TABLET | Freq: Every evening | ORAL | Status: DC | PRN

## 2011-12-16 NOTE — Progress Notes (Signed)
Physical Therapy Evaluation Patient Details Name: Andrew Bonilla MRN: [id number] DOB: 19-Nov-1932 Today's Date: 12/16/2011  Problem List:  Patient Active Problem List  Diagnoses  . Syncope and collapse  . Hypertension  . Hyperlipidemia  . Constipation  . Aortic stenosis, severe by echo    Past Medical History:  Past Medical History  Diagnosis Date  . Hyperlipemia   . Hypertension   . Constipation   . Hard of hearing   . Generalized headaches    Past Surgical History: History reviewed. No pertinent past surgical history.  PT Assessment/Plan/Recommendation PT Assessment Clinical Impression Statement: 78 yo male admitted with syncope/ aortic stenosis and cardiac wall movement dysfunction noted presents with decreased activity tol with BPs elevated during activity; For Cardiac Cath tomorrow; Will benefit from acute PT follow-up (possibly one more session) to assess activity tol post Cath PT Recommendation/Assessment: Patient will need skilled PT in the acute care venue PT Problem List: Decreased strength;Decreased activity tolerance;Cardiopulmonary status limiting activity PT Therapy Diagnosis : Generalized weakness;Difficulty walking PT Plan PT Frequency: Min 3X/week PT Treatment/Interventions: DME instruction;Gait training;Stair training;Functional mobility training;Therapeutic activities;Therapeutic exercise;Patient/family education (as appropriate based on pt status post cath) PT Recommendation Follow Up Recommendations: No PT follow up;Home health PT (dependent on status post cath/medical course) Equipment Recommended: None recommended by PT PT Goals  Acute Rehab PT Goals PT Goal Formulation: With patient Time For Goal Achievement: 2 weeks Pt will go Sit to Stand: with modified independence;without upper extremity assist PT Goal: Sit to Stand - Progress: Goal set today Pt will go Stand to Sit: with modified independence;without upper extremity assist PT Goal: Stand to Sit -  Progress: Goal set today Pt will Ambulate: >150 feet;Independently (without abnormal BPs) PT Goal: Ambulate - Progress: Goal set today Pt will Go Up / Down Stairs: Flight;with modified independence;with rail(s) PT Goal: Up/Down Stairs - Progress: Goal set today  PT Evaluation Precautions/Restrictions  Precautions Precautions: Other (comment) (Self monitor for syncope) Precaution Comments: Stressed to pt to self monitor for syncope, and up with assistance Restrictions Other Position/Activity Restrictions: Watch BP Prior Functioning  Home Living Lives With: Spouse Available Help at Discharge: Family Type of Home: House Home Access: Stairs to enter Secretary/administrator of Steps: 1 Entrance Stairs-Rails: None Home Layout: Multi-level Alternate Level Stairs-Number of Steps: 12 Alternate Level Stairs-Rails: Right Home Adaptive Equipment: None Additional Comments: Pt can stay on main floor, but does enjoy second floor Prior Function Level of Independence: Independent Able to Take Stairs?: Yes Driving: Yes Vocation: Retired Financial risk analyst Arousal/Alertness: Awake/alert Overall Cognitive Status: Appears within functional limits for tasks assessed Sensation/Coordination Sensation Light Touch: Appears Intact Coordination Gross Motor Movements are Fluid and Coordinated: Yes Fine Motor Movements are Fluid and Coordinated: Yes Extremity Assessment RUE Assessment RUE Assessment: Within Functional Limits LUE Assessment LUE Assessment: Within Functional Limits RLE Assessment RLE Assessment: Within Functional Limits (slight weakness: dep on UEs for sit to stand) LLE Assessment LLE Assessment: Within Functional Limits (slight weakness: dep on UEs for sit to stand) Mobility (including Balance) Bed Mobility Bed Mobility: Yes Sit to Supine: 5: Supervision Sit to Supine - Details (indicate cue type and reason): cues to keep from valsalva-type action Transfers Transfers:  Yes Sit to Stand: 5: Supervision Sit to Stand Details (indicate cue type and reason): Noted increased effort with valsalva-like action Stand to Sit: 5: Supervision;With upper extremity assist;To bed Stand to Sit Details: godd control and transition Ambulation/Gait Ambulation/Gait: Yes Ambulation/Gait Assistance: 5: Supervision Ambulation/Gait Assistance Details (indicate cue type and reason):  Cues to self-monitor throughout amb; Took several BPs during session: 172/71 walking  188/96 walking (stopped amb and headed back to room)   187/71 walking to room  175/80 supine RN notified of vitals during activity  Ambulation Distance (Feet): 175 Feet Assistive device: None Gait Pattern: Within Functional Limits      End of Session PT - End of Session Activity Tolerance:  (Ended amb as BPs were high) Patient left: in bed;with call bell in reach;with family/visitor present Nurse Communication: Mobility status for ambulation (elevated BPs with activity) General Behavior During Session: [facility] for tasks performed Cognition: [facility] for tasks performed  Van Clines [facility] [place], [place] [phone]  12/16/2011, 3:02 PM

## 2011-12-16 NOTE — Progress Notes (Addendum)
[facility]  DAILY PROGRESS NOTE   Subjective:  No new events overnight. Telemetry was unremarkable.   Objective:  Temp:  [97.2 F (36.2 C)-98.2 F (36.8 C)] 97.2 F (36.2 C) (04/14 0544) Pulse Rate:  [75-77] 75  (04/14 0544) Resp:  [20] 20  (04/14 0544) BP: (143-178)/(63-80) 143/80 mmHg (04/14 0544) SpO2:  [96 %-97 %] 97 % (04/14 0544) Weight:  [105.9 kg (233 lb 7.5 oz)] 105.9 kg (233 lb 7.5 oz) (04/14 0620) Weight change: -0.5 kg (-1 lb 1.6 oz)  Intake/Output from previous day: 04/13 0701 - 04/14 0700 In: 720 [P.O.:720] Out: 850 [Urine:850]  Intake/Output from this shift:    Medications: Current Facility-Administered Medications  Medication Dose Route Frequency Provider Last Rate Last Dose  . atorvastatin (LIPITOR) tablet 40 mg  40 mg Oral Daily Debby Crosley, MD   40 mg at 12/15/11 0943  . benzonatate (TESSALON) capsule 100 mg  100 mg Oral TID PRN Gery Pray, MD   100 mg at 12/15/11 1936  . bisacodyl (DULCOLAX) suppository 10 mg  10 mg Rectal PRN Debby Crosley, MD      . docusate sodium (COLACE) capsule 100 mg  100 mg Oral QHS Ripudeep K Rai, MD      . enoxaparin (LOVENOX) injection 40 mg  40 mg Subcutaneous Q24H Debby Crosley, MD   40 mg at 12/15/11 1502  . guaiFENesin-codeine 100-10 MG/5ML solution 10 mL  10 mL Oral Q4H PRN Debby Crosley, MD   10 mL at 12/15/11 1724  . irbesartan (AVAPRO) tablet 75 mg  75 mg Oral Daily Leroy Sea, MD   75 mg at 12/15/11 4132   And  . hydrochlorothiazide (MICROZIDE) capsule 12.5 mg  12.5 mg Oral Daily Leroy Sea, MD   12.5 mg at 12/15/11 0943  . pantoprazole (PROTONIX) EC tablet 40 mg  40 mg Oral Q1200 Debby Crosley, MD   40 mg at 12/15/11 1255  . polyethylene glycol (MIRALAX / GLYCOLAX) packet 17 g  17 g Oral Daily Debby Crosley, MD   17 g at 12/15/11 0943  . DISCONTD: 0.9 % NaCl with KCl 20 mEq/ L  infusion   Intravenous Continuous Debby Crosley, MD 75 mL/hr at 12/15/11 0522      Physical  Exam: General appearance: alert and no distress Neck: no adenopathy, no carotid bruit, no JVD, supple, symmetrical, trachea midline and thyroid not enlarged, symmetric, no tenderness/mass/nodules Lungs: clear to auscultation bilaterally Heart: regular rate and rhythm and S1, S2 soft but audible, 3/6 SEM at RUSB, late-peaking crescendo Abdomen: soft, non-tender; bowel sounds normal; no masses,  no organomegaly Extremities: extremities normal, atraumatic, no cyanosis or edema  Lab Results: Results for orders placed during the hospital encounter of 12/14/11 (from the past 48 hour(s))  CBC     Status: Abnormal   Collection Time   12/15/11 12:49 AM      Component Value Range Comment   WBC 14.9 (*) 4.0 - 10.5 (K/uL)    RBC 4.62  4.22 - 5.81 (MIL/uL)    Hemoglobin 14.4  13.0 - 17.0 (g/dL)    HCT 44.0  10.2 - 72.5 (%)    MCV 87.0  78.0 - 100.0 (fL)    MCH 31.2  26.0 - 34.0 (pg)    MCHC 35.8  30.0 - 36.0 (g/dL)    RDW 36.6  44.0 - 34.7 (%)    Platelets 271  150 - 400 (K/uL)   COMPREHENSIVE METABOLIC PANEL  Status: Abnormal   Collection Time   12/15/11 12:49 AM      Component Value Range Comment   Sodium 133 (*) 135 - 145 (mEq/L)    Potassium 4.0  3.5 - 5.1 (mEq/L)    Chloride 95 (*) 96 - 112 (mEq/L)    CO2 27  19 - 32 (mEq/L)    Glucose, Bld 135 (*) 70 - 99 (mg/dL)    BUN 16  6 - 23 (mg/dL)    Creatinine, Ser 4.09  0.50 - 1.35 (mg/dL)    Calcium 9.7  8.4 - 10.5 (mg/dL)    Total Protein 7.3  6.0 - 8.3 (g/dL)    Albumin 4.1  3.5 - 5.2 (g/dL)    AST 27  0 - 37 (U/L)    ALT 29  0 - 53 (U/L)    Alkaline Phosphatase 64  39 - 117 (U/L)    Total Bilirubin 1.0  0.3 - 1.2 (mg/dL)    GFR calc non Af Amer 66 (*) >90 (mL/min)    GFR calc Af Amer 77 (*) >90 (mL/min)   POCT I-STAT TROPONIN I     Status: Normal   Collection Time   12/15/11  1:04 AM      Component Value Range Comment   Troponin i, poc 0.02  0.00 - 0.08 (ng/mL)    Comment 3            POCT I-STAT, CHEM 8     Status: Abnormal    Collection Time   12/15/11  1:05 AM      Component Value Range Comment   Sodium 136  135 - 145 (mEq/L)    Potassium 4.1  3.5 - 5.1 (mEq/L)    Chloride 99  96 - 112 (mEq/L)    BUN 17  6 - 23 (mg/dL)    Creatinine, Ser 8.11  0.50 - 1.35 (mg/dL)    Glucose, Bld 914 (*) 70 - 99 (mg/dL)    Calcium, Ion 7.82  1.12 - 1.32 (mmol/L)    TCO2 26  0 - 100 (mmol/L)    Hemoglobin 15.0  13.0 - 17.0 (g/dL)    HCT 95.6  21.3 - 08.6 (%)   OCCULT BLOOD, POC DEVICE     Status: Normal   Collection Time   12/15/11  2:22 AM      Component Value Range Comment   Fecal Occult Bld POSITIVE     URINALYSIS, ROUTINE W REFLEX MICROSCOPIC     Status: Abnormal   Collection Time   12/15/11  3:08 AM      Component Value Range Comment   Color, Urine AMBER (*) YELLOW  BIOCHEMICALS MAY BE AFFECTED BY COLOR   APPearance CLEAR  CLEAR     Specific Gravity, Urine 1.023  1.005 - 1.030     pH 6.5  5.0 - 8.0     Glucose, UA NEGATIVE  NEGATIVE (mg/dL)    Hgb urine dipstick NEGATIVE  NEGATIVE     Bilirubin Urine NEGATIVE  NEGATIVE     Ketones, ur NEGATIVE  NEGATIVE (mg/dL)    Protein, ur NEGATIVE  NEGATIVE (mg/dL)    Urobilinogen, UA 1.0  0.0 - 1.0 (mg/dL)    Nitrite NEGATIVE  NEGATIVE     Leukocytes, UA NEGATIVE  NEGATIVE  MICROSCOPIC NOT DONE ON URINES WITH NEGATIVE PROTEIN, BLOOD, LEUKOCYTES, NITRITE, OR GLUCOSE <1000 mg/dL.  CBC     Status: Abnormal   Collection Time   12/15/11  9:15 AM  Component Value Range Comment   WBC 10.8 (*) 4.0 - 10.5 (K/uL)    RBC 4.24  4.22 - 5.81 (MIL/uL)    Hemoglobin 13.1  13.0 - 17.0 (g/dL)    HCT 14.7 (*) 82.9 - 52.0 (%)    MCV 88.0  78.0 - 100.0 (fL)    MCH 30.9  26.0 - 34.0 (pg)    MCHC 35.1  30.0 - 36.0 (g/dL)    RDW 56.2  13.0 - 86.5 (%)    Platelets 281  150 - 400 (K/uL)   BASIC METABOLIC PANEL     Status: Abnormal   Collection Time   12/15/11  9:15 AM      Component Value Range Comment   Sodium 136  135 - 145 (mEq/L)    Potassium 4.3  3.5 - 5.1 (mEq/L)    Chloride 101   96 - 112 (mEq/L)    CO2 25  19 - 32 (mEq/L)    Glucose, Bld 131 (*) 70 - 99 (mg/dL)    BUN 13  6 - 23 (mg/dL)    Creatinine, Ser 7.84  0.50 - 1.35 (mg/dL)    Calcium 9.2  8.4 - 10.5 (mg/dL)    GFR calc non Af Amer 82 (*) >90 (mL/min)    GFR calc Af Amer >90  >90 (mL/min)     Imaging: Ct Head Wo Contrast  12/15/2011  *RADIOLOGY REPORT*  Clinical Data: Syncope.  Slurred speech.  CT HEAD WITHOUT CONTRAST  Technique:  Contiguous axial images were obtained from the base of the skull through the vertex without contrast.  Comparison: None.  Findings: There is no evidence of intracranial hemorrhage, brain edema or other signs of acute infarction.  There is no evidence of intracranial mass lesion or mass effect.  No abnormal extra-axial fluid collections are identified.  Mild cerebral atrophy and ventriculomegaly are noted.  No other intracranial abnormality identified.  No evidence of skull fracture or other bone lesions.  IMPRESSION:  1.  No acute intracranial abnormality. 2.  Mild diffuse cerebral atrophy.  Original Report Authenticated By: Danae Orleans, M.D.   Dg Abd Acute W/chest  12/15/2011  *RADIOLOGY REPORT*  Clinical Data: Constipation, nausea, syncope  ACUTE ABDOMEN SERIES (ABDOMEN 2 VIEW & CHEST 1 VIEW)  Comparison: None.  Findings: Lungs are essentially clear. No pleural effusion or pneumothorax.  Cardiomediastinal silhouette is within normal limits.  Nonobstructive bowel gas pattern.  No evidence of free air under the diaphragm on the upright view.  Mild degenerative changes of the visualized thoracolumbar spine.  IMPRESSION: No evidence of acute cardiopulmonary disease.  No evidence of small bowel obstruction or free air.  Original Report Authenticated By: Charline Bills, M.D.    Assessment:  1. Principal Problem: 2.  *Syncope and collapse 3. Active Problems: 4.  Hypertension 5.  Hyperlipidemia 6.  Constipation 7.  Aortic stenosis, severe by echo 8.   Plan:  1. As discussed  with patient, plan LHC in am tomorrow with Dr. Tresa Endo.  Aortic valve is severely stenosed by echo. Clinical scenario was a set-up for syncope with aortic stenosis. Mild inferior wall motion abnormality with low-normal EF.  Will need to rule-out CAD prior to recommendation for AVR.   Appreciate hospital medicine consult. We will assume care of the patient on our service.  Time Spent Directly with Patient:  15 minutes  Length of Stay:  LOS: 2 days   Chrystie Nose, MD, [facility] Attending Cardiologist [facility]  Avan Gullett C 12/16/2011, 8:59 AM

## 2011-12-16 NOTE — Progress Notes (Signed)
Patient ID: Andrew Bonilla  male  IRJ:[id number]    DOB: 1933-04-13    DOA: 12/14/2011  PCP: Georgann Housekeeper, MD, MD  Subjective: 3 bowel movements yesterday, no new events overnight  Objective: Weight change: -0.5 kg (-1 lb 1.6 oz)  Intake/Output Summary (Last 24 hours) at 12/16/11 1054 Last data filed at 12/16/11 1047  Gross per 24 hour  Intake    720 ml  Output   1050 ml  Net   -330 ml   Blood pressure 143/80, pulse 75, temperature 97.2 F (36.2 C), temperature source Oral, resp. rate 20, height 5\' 7"  (1.702 m), weight 105.9 kg (233 lb 7.5 oz), SpO2 97.00%.  Physical Exam: General: Alert and awake, oriented x3, not in any acute distress. HEENT: anicteric sclera, pupils reactive to light and accommodation, EOMI CVS: S1-S2 clear,SEM 3/6 Chest: clear to auscultation bilaterally, no wheezing, rales or rhonchi Abdomen: soft nontender, nondistended, normal bowel sounds, no organomegaly Extremities: no cyanosis, clubbing or edema noted bilaterally Neuro: Cranial nerves II-XII intact, no focal neurological deficits  Lab Results: Basic Metabolic Panel:  Lab 12/15/11 3016 12/15/11 0105 12/15/11 0049  NA 136 136 --  K 4.3 4.1 --  CL 101 99 --  CO2 25 -- 27  GLUCOSE 131* 135* --  BUN 13 17 --  CREATININE 0.84 1.00 --  CALCIUM 9.2 -- 9.7  MG -- -- --  PHOS -- -- --   Liver Function Tests:  Lab 12/15/11 0049  AST 27  ALT 29  ALKPHOS 64  BILITOT 1.0  PROT 7.3  ALBUMIN 4.1   CBC:  Lab 12/15/11 0915 12/15/11 0105 12/15/11 0049  WBC 10.8* -- 14.9*  NEUTROABS -- -- --  HGB 13.1 15.0 --  HCT 37.3* 44.0 --  MCV 88.0 -- 87.0  PLT 281 -- 271    Studies/Results: Ct Head Wo Contrast  12/15/2011  *RADIOLOGY REPORT*  Clinical Data: Syncope.  Slurred speech.  CT HEAD WITHOUT CONTRAST  Technique:  Contiguous axial images were obtained from the base of the skull through the vertex without contrast.  Comparison: None.  Findings: There is no evidence of intracranial hemorrhage, brain  edema or other signs of acute infarction.  There is no evidence of intracranial mass lesion or mass effect.  No abnormal extra-axial fluid collections are identified.  Mild cerebral atrophy and ventriculomegaly are noted.  No other intracranial abnormality identified.  No evidence of skull fracture or other bone lesions.  IMPRESSION:  1.  No acute intracranial abnormality. 2.  Mild diffuse cerebral atrophy.  Original Report Authenticated By: Danae Orleans, M.D.   Dg Abd Acute W/chest  12/15/2011  *RADIOLOGY REPORT*  Clinical Data: Constipation, nausea, syncope  ACUTE ABDOMEN SERIES (ABDOMEN 2 VIEW & CHEST 1 VIEW)  Comparison: None.  Findings: Lungs are essentially clear. No pleural effusion or pneumothorax.  Cardiomediastinal silhouette is within normal limits.  Nonobstructive bowel gas pattern.  No evidence of free air under the diaphragm on the upright view.  Mild degenerative changes of the visualized thoracolumbar spine.  IMPRESSION: No evidence of acute cardiopulmonary disease.  No evidence of small bowel obstruction or free air.  Original Report Authenticated By: Charline Bills, M.D.    Medications: Scheduled Meds:   . aspirin  324 mg Oral Pre-Cath  . aspirin EC  325 mg Oral Daily  . aspirin  325 mg Oral Daily  . atorvastatin  40 mg Oral Daily  . diazepam  5 mg Oral On Call  . docusate sodium  100  mg Oral QHS  . irbesartan  75 mg Oral Daily   And  . hydrochlorothiazide  12.5 mg Oral Daily  . hydrochlorothiazide  12.5 mg Oral Daily  . pantoprazole  40 mg Oral Q1200  . polyethylene glycol  17 g Oral Daily  . DISCONTD: enoxaparin  40 mg Subcutaneous Q24H   Continuous Infusions:   . sodium chloride    . DISCONTD: 0.9 % NaCl with KCl 20 mEq / L 75 mL/hr at 12/15/11 0522   2-D echocardiogram 12/15/2011  Left ventricle: The cavity size was normal. There was mild concentric hypertrophy. Systolic function was low normal. The estimated ejection fraction was in the range of 50% to 55%.  There is basal to mid inferior hypokinesis. Doppler parameters are consistent with abnormal left ventricular relaxation (grade 1 diastolic dysfunction). The E/e' ratio is ~10, suggesting borderline elevated LV filling pressure. - Aortic valve: Calcified with restricted leaflet motion. There is severe aortic stenosis. The peak and mean gradients are 63 mmHg and 34 mmHg, respectively. Based on an LVOT diameter of 1.9 cm, the calculated AVA is ~0.9-1.0 cm2. There is trace aortic insufficiency. Valve area: 0.99cm^2(VTI). Valve area: 0.91cm^2 (Vmax). - Mitral valve: Calcified annulus. Trace to mild regurgitation. - Left atrium: Moderately dilated (37 ml/m2). - Atrial septum: No defect or patent foramen ovale was identified.    Assessment/Plan: Principal Problem:  *Syncope and collapse: - Likely secondary to severe aortic stenosis and contribution from dehydration, vasovagal effect from the straining from constipation - 2-D echo with severe aortic stenosis, discussed with cardiology yesterday, plan for left heart cath tomorrow for further management, AVR in near future.  Cough: Per patient improving after Tussionex and Tessalon Perles, continue PPI - Patient is also recovering from bronchitis, recently completed a Z-Pak course, I also recommended him to see pulmonologist if he has exertional asthma or allergic/cough variant asthma  - He is not wheezing currently, lung exam is clear, may benefit from albuterol inhaler PRN.  - ? ARB causing cough, defer to cardiology  Active Problems:  Hypertension: Needs better control, currently on Avapro and HCTZ   Hyperlipidemia: Continue statin   Constipation: Improved, continue Colace and MiraLax daily   Aortic stenosis, severe by echo: Per cardiology  DVT Prophylaxis: SCDs, DC'd Lovenox due to positive FOBT. Patient had colonoscopy outpatient which was unremarkable per his report.  Code Status: Full code  Disposition: Discussed in detail  with cardiology service, Dr. Rennis Golden, will assume care (highly appreciate!). [facility] medicine will sign off.    LOS: 2 days   Himani Corona M.D. [facility] 12/16/2011, 10:54 AM Pager: [phone]

## 2011-12-17 ENCOUNTER — Encounter (HOSPITAL_COMMUNITY)
Admission: EM | Disposition: A | Discharge status: Home / Self Care | Payer: Self-pay | Source: Ambulatory Visit | Attending: Internal Medicine

## 2011-12-17 HISTORY — PX: LEFT HEART CATHETERIZATION WITH CORONARY ANGIOGRAM: SHX5451

## 2011-12-17 HISTORY — PX: CARDIAC CATHETERIZATION: SHX172

## 2011-12-17 LAB — APTT: aPTT: 32 seconds (ref 24–37)

## 2011-12-17 LAB — POCT I-STAT 3, VENOUS BLOOD GAS (G3P V)
O2 Saturation: 69 %
TCO2: 24 mmol/L (ref 0–100)
pCO2, Ven: 43.7 mmHg — ABNORMAL LOW (ref 45.0–50.0)
pO2, Ven: 39 mmHg (ref 30.0–45.0)

## 2011-12-17 LAB — PROTIME-INR
INR: 1.04 (ref 0.00–1.49)
Prothrombin Time: 13.8 seconds (ref 11.6–15.2)

## 2011-12-17 LAB — BASIC METABOLIC PANEL
BUN: 12 mg/dL (ref 6–23)
CO2: 26 mEq/L (ref 19–32)
Calcium: 9.1 mg/dL (ref 8.4–10.5)
Chloride: 101 mEq/L (ref 96–112)
Creatinine, Ser: 0.96 mg/dL (ref 0.50–1.35)
GFR calc Af Amer: >90 mL/min (ref >90)
GFR calc non Af Amer: 78 mL/min — ABNORMAL LOW (ref >90)
Glucose, Bld: 102 mg/dL — ABNORMAL HIGH (ref 70–99)
Potassium: 4.3 mEq/L (ref 3.5–5.1)
Sodium: 138 mEq/L (ref 135–145)

## 2011-12-17 LAB — CBC
HCT: 35.3 % — ABNORMAL LOW (ref 39.0–52.0)
Hemoglobin: 12.7 g/dL — ABNORMAL LOW (ref 13.0–17.0)
MCH: 31.4 pg (ref 26.0–34.0)
MCHC: 36 g/dL (ref 30.0–36.0)
MCV: 87.4 fL (ref 78.0–100.0)
Platelets: 252 10*3/uL (ref 150–400)
RBC: 4.04 MIL/uL — ABNORMAL LOW (ref 4.22–5.81)
RDW: 13 % (ref 11.5–15.5)
WBC: 7.5 10*3/uL (ref 4.0–10.5)

## 2011-12-17 LAB — POCT I-STAT 3, ART BLOOD GAS (G3+)
O2 Saturation: 97 %
pCO2 arterial: 39.8 mmHg (ref 35.0–45.0)
pO2, Arterial: 91 mmHg (ref 80.0–100.0)

## 2011-12-17 SURGERY — LEFT HEART CATHETERIZATION WITH CORONARY ANGIOGRAM
Anesthesia: LOCAL

## 2011-12-17 MED ORDER — FENTANYL CITRATE 0.05 MG/ML IJ SOLN
INTRAMUSCULAR | Status: AC
  Filled 2011-12-17: qty 2

## 2011-12-17 MED ORDER — ACETAMINOPHEN 325 MG PO TABS: 650.0000 mg | ORAL_TABLET | ORAL | Status: DC | PRN

## 2011-12-17 MED ORDER — LIDOCAINE HCL (PF) 1 % IJ SOLN
INTRAMUSCULAR | Status: AC
  Filled 2011-12-17: qty 30

## 2011-12-17 MED ORDER — MIDAZOLAM HCL 2 MG/2ML IJ SOLN
INTRAMUSCULAR | Status: AC
  Filled 2011-12-17: qty 2

## 2011-12-17 MED ORDER — HEPARIN (PORCINE) IN NACL 2-0.9 UNIT/ML-% IJ SOLN
INTRAMUSCULAR | Status: AC
  Filled 2011-12-17: qty 2000

## 2011-12-17 MED ORDER — SODIUM CHLORIDE 0.9 % IV SOLN: INTRAVENOUS | Status: DC

## 2011-12-17 MED ORDER — IRBESARTAN 75 MG PO TABS
75.0000 mg | ORAL_TABLET | Freq: Every day | ORAL | Status: DC
  Administered 2011-12-17 – 2011-12-18 (×2): 75 mg via ORAL
  Filled 2011-12-17 (×2): qty 1

## 2011-12-17 MED ORDER — NITROGLYCERIN 0.2 MG/ML ON CALL CATH LAB
INTRAVENOUS | Status: AC
  Filled 2011-12-17: qty 1

## 2011-12-17 MED ORDER — ONDANSETRON HCL 4 MG/2ML IJ SOLN: 4.0000 mg | Freq: Four times a day (QID) | INTRAMUSCULAR | Status: DC | PRN

## 2011-12-17 NOTE — Progress Notes (Signed)
Subjective:  No syncope or chest pain. Mild cough.  Objective:  Vital Signs in the last 24 hours: Temp:  [98 F (36.7 C)-98.4 F (36.9 C)] 98.2 F (36.8 C) (04/15 1610) Pulse Rate:  [64-76] 76  (04/15 0633) Resp:  [20] 20  (04/15 0633) BP: (153-188)/(66-96) 174/93 mmHg (04/15 0633) SpO2:  [96 %-99 %] 96 % (04/15 0633) Weight:  [106 kg (233 lb 11 oz)] 106 kg (233 lb 11 oz) (04/15 9604)  Intake/Output from previous day:  Intake/Output Summary (Last 24 hours) at 12/17/11 0844 Last data filed at 12/17/11 0843  Gross per 24 hour  Intake 1788.23 ml  Output   2100 ml  Net -311.77 ml    Physical Exam: General appearance: alert, cooperative, no distress and mildly obese Neck: transmitted murmur vs bruit on left Lungs: clear to auscultation bilaterally Heart: regular rate and rhythm and 2/6 systolic murmur Abd: soft No edema   Rate: 75  Rhythm: normal sinus rhythm  Lab Results:  Basename 12/17/11 0500 12/15/11 0915  WBC 7.5 10.8*  HGB 12.7* 13.1  PLT 252 281    Basename 12/17/11 0500 12/15/11 0915  NA 138 136  K 4.3 4.3  CL 101 101  CO2 26 25  GLUCOSE 102* 131*  BUN 12 13  CREATININE 0.96 0.84   No results found for this basename: TROPONINI:2,CK,MB:2 in the last 72 hours Hepatic Function Panel  Basename 12/15/11 0049  PROT 7.3  ALBUMIN 4.1  AST 27  ALT 29  ALKPHOS 64  BILITOT 1.0  BILIDIR --  IBILI --   No results found for this basename: CHOL in the last 72 hours  Basename 12/17/11 0500  INR 1.04    Imaging: Imaging results have been reviewed  Cardiac Studies: 2D------------------------------------------------------------- Study Conclusions  - Left ventricle: The cavity size was normal. There was mild concentric hypertrophy. Systolic function was low normal. The estimated ejection fraction was in the range of 50% to 55%. There is basal to mid inferior hypokinesis. Doppler parameters are consistent with abnormal left ventricular relaxation  (grade 1 diastolic dysfunction). The E/e' ratio is ~10, suggesting borderline elevated LV filling pressure. - Aortic valve: Calcified with restricted leaflet motion. There is severe aortic stenosis. The peak and mean gradients are 63 mmHg and 34 mmHg, respectively. Based on an LVOT diameter of 1.9 cm, the calculated AVA is ~0.9-1.0 cm2. There is trace aortic insufficiency. Valve area: 0.99cm^2(VTI). Valve area: 0.91cm^2 (Vmax). - Mitral valve: Calcified annulus. Trace to mild regurgitation. - Left atrium: Moderately dilated (37 ml/m2). - Atrial septum: No defect or patent foramen ovale was identified.    Assessment/Plan:   Principal Problem:  *Syncope and collapse  Active Problems:  Hypertension  Aortic stenosis, severe by echo  Hyperlipidemia  Constipation  Plan-Cath today, further recommendations pending. Hold Avapro/HCTZ this am    Corine Shelter PA-C 12/17/2011, 8:44 AM   Patient seen and examined. Agree with assessment and plan. Echo reviewed. 2/6 SEM early to mid peaking; not very harsh. Pt states that he has a left carotid stenosis of 60 - 70%.  No history of angina, or chest pain. Syncope occurred after straining.  Discussed R and L cath with pt and family.  Plan this am.   Lennette Bihari, MD, [facility] 12/17/2011 9:01 AM

## 2011-12-17 NOTE — Progress Notes (Signed)
PM Post cath note Discussed cath findings again with pt and family.  Discussed with Dr. Rennis Golden. Will aim for probable DC in am.  Concern is that LV function is mildly impaired. Moderately severe AS with AVA 1.2. Question if syncope secondary to AS since occurred while straining very hard.  Dr Rennis Golden to follow as outpt and to further discuss options.

## 2011-12-17 NOTE — Cardiovascular Report (Signed)
NAMEJSAON, YOO NO.:  [id number]  MEDICAL RECORD NO.:  [id number]  LOCATION:  [id number]                         FACILITY:  [facility]  PHYSICIAN:  Nicki Guadalajara, M.D.     DATE OF BIRTH:  1933/07/07  DATE OF PROCEDURE: DATE OF DISCHARGE:                           CARDIAC CATHETERIZATION   PROCEDURE:  Right and left heart catheterization.  INDICATIONS:  Andrew Bonilla is a 78 year old gentleman who apparently suffered a syncopal spell several days ago which occurred while straining.  An echo Doppler was performed which raised the possibility of basal to mid inferior hypokinesis with an ejection fraction of 50- 55%.  His aortic valve was calcified with restricted leaflet motion. The mean gradient calculated at 34 mm with a peak instantaneous gradient of 63 mm giving a valve area approximately 1.0 sq cm.  He did have calcified mitral anulus with trace to mild MR.  He also had at least trace AR.  He is now referred for cardiac catheterization for further assessment of coronary anatomy and aortic stenosis.  PROCEDURE:  After premedication with Versed 2 mg plus fentanyl 25 mcg, the patient was prepped and draped in usual fashion.  His right femoral artery and right femoral vein were punctured and a 7-French venous sheath and 6-French arterial sheath were inserted without difficulty. Swan-Ganz catheterization was done with pressure recordings in the RA, RV, PA, and PC positions.  O2 saturation was obtained in the pulmonary artery.  A pigtail catheter was then placed and oxygen saturation aorta was obtained.  The pigtail catheter was very easily able to cross the aortic valve in its own without need for straight wire.  This crossed without any significant resistance.  Consequently, LV and PC pressures were then recorded.  The simultaneous pressure was also obtained from the FA and the LV.  RAO ventriculography was performed.  The pigtail catheter was then pulled back into the  aorta and pressures were determined on pullback.  Central aortography was then performed to assess aortic root and aortic insufficiency.  Since there was a pressure difference with the AO and FA catheter, distal aortography was also performed to make certain there was not a significant stenosis and also to further evaluate his renal arteries since he does have a history of hypertension.  After the pigtail catheter was removed, attention was then directed to the coronary arteries.  A 5-French diagnostic FL-4 and FR-4 catheters were used for selective angiography into the coronary arteries.  Hemostasis was obtained by direct manual pressure.  The patient tolerated the procedure well.  HEMODYNAMIC DATA:  Right atrial pressure mean 9, right ventricular pressure 30/11, pulmonary artery pressure 30/17, pulmonary capillary wedge pressure mean 18.  Initially, when the pigtail catheter crossed the aortic valve, the initial left ventricular pressure was 196/7, post A-wave 17.  On pullback, the left ventricular pressure was 187/14 and central aortic pressure was 164/78 giving a peak-to-peak pressure gradient of 23 mm. Calculated mean gradient was 29 mm and calculated aortic valve area was 1.2 sq cm.  O2 saturation in the pulmonary artery was 69% and the aorta was 97%.  Cardiac output was 5.9 by the Fick method and 5.3 by  the thermodilution method with a cardiac index of 2.8 and 2.6 meter/meter squared, respectively.  ANGIOGRAPHIC DATA:  RAO ventriculography revealed suggestion of left ventriculare hypertrophy.  There was ejection fraction of approximately 50%.  Fluoroscopy revealed the aortic valve to be moderately calcified.  There was moderate decreased excursion, but mobility was visualized.  There was trace aortic insufficiency noted.  Aortic root was not dilated. Next, distal aortography revealed widely patent renal arteries.  No significant stenosis was demonstrated in the aorta or  common iliac system.  Runoff was not well visualized beyond this.  Left main coronary artery was short and immediately bifurcated into an LAD and left circumflex system.  The LAD had 20% narrowing proximally just beyond the ostium and again was 20% narrowed after diagonal takeoff.  The LAD was mildly calcified without any significant focal stenoses.  Circumflex vessel gave rise to 3 marginal-type vessels.  There was 20- 30% narrowing in the origin of the first marginal vessel.  20% narrowing in the AV groove circumflex.  The right coronary artery was a large- caliber dominant vessel that had 20% mild luminal irregularity.  The vessel ended in 3 distal branches of moderate caliber without significant stenoses.  IMPRESSION: 1. Low normal to mildly impaired left ventricular function with     ejection fraction of 50% with evidence for left ventricular     hypertrophy. 2. Moderate aortic stenosis with a peak-to-peak gradient of 23 mm,     mean gradient 29 mm, and aortic valve area 1.2 sq cm with trace     aortic insufficiency. 3. Mildly calcified coronary arteries with mild nonobstructive     stenoses with 20% in the left anterior descending proximally and     after diagonal vessel, 30% in proximal first obtuse marginal artery     vessel with 20% atrioventricular groove narrowing and 20% in the     right coronary artery.          ______________________________ Nicki Guadalajara, M.D.     TK/MEDQ  D:  12/17/2011  T:  12/17/2011  Job:  [id number]  cc:   Dr. Thedore Mins

## 2011-12-17 NOTE — CV Procedure (Signed)
R and L Heart Cath:  Andrew Bonilla, 78 y.o., male  Full note dictated; see diagram  DICTATION # [id number], [id number]  Hemodynamics: RA 9 RV 30 /11 PA 30/17 PC 18   Pigtail catheter  was able to cross valve without any difficulty; arguing against severe AS.  AV calcified with at least moderately reduced excursion.  Pull back pressures: LV 187/14 Ao: 164/78 P-P AVG 23,  Mean 29  AVA 1.2 cm2  Minimal nonobstructive CAD with 20 % LAD stenosis,  30% OM and 20% LCX, and 20% RCA.  Of note, patient's syncopal spells were at both times associated with significant straining (constipated trying to have a BM, and trying to void), question precipitating valsalva or vasovagal rather than due to AS.  Lennette Bihari, MD, [facility] 12/17/2011 5:54 PM

## 2011-12-18 MED ORDER — BENZONATATE 100 MG PO CAPS: 100.0000 mg | ORAL_CAPSULE | Freq: Three times a day (TID) | ORAL | Status: AC | PRN

## 2011-12-18 MED ORDER — ASPIRIN 325 MG PO TBEC: 325.0000 mg | DELAYED_RELEASE_TABLET | Freq: Every day | ORAL | Status: AC

## 2011-12-18 MED ORDER — IRBESARTAN 75 MG PO TABS
75.0000 mg | ORAL_TABLET | Freq: Once | ORAL | Status: DC
  Filled 2011-12-18: qty 1

## 2011-12-18 MED ORDER — VALSARTAN 160 MG PO TABS: 160.0000 mg | ORAL_TABLET | Freq: Every day | ORAL | Status: DC

## 2011-12-18 MED ORDER — IRBESARTAN 150 MG PO TABS: 150.0000 mg | ORAL_TABLET | Freq: Every day | ORAL | Status: DC

## 2011-12-18 MED ORDER — ACETAMINOPHEN 325 MG PO TABS: 650.0000 mg | ORAL_TABLET | ORAL | Status: DC | PRN

## 2011-12-18 NOTE — Progress Notes (Signed)
Subjective:  No complaints  Objective:  Vital Signs in the last 24 hours: Temp:  [97.6 F (36.4 C)-98.9 F (37.2 C)] 97.6 F (36.4 C) (04/16 0627) Pulse Rate:  [57-65] 57  (04/16 0627) Resp:  [18-19] 18  (04/16 0627) BP: (149-167)/(58-82) 154/82 mmHg (04/16 0627) SpO2:  [97 %-98 %] 98 % (04/16 0627) Weight:  [105.915 kg (233 lb 8 oz)] 105.915 kg (233 lb 8 oz) (04/16 0627)  Intake/Output from previous day:  Intake/Output Summary (Last 24 hours) at 12/18/11 1053 Last data filed at 12/18/11 0849  Gross per 24 hour  Intake   1940 ml  Output   1450 ml  Net    490 ml    Physical Exam: General appearance: alert, cooperative, no distress and moderately obese Lungs: clear to auscultation bilaterally Heart: regular rate and rhythm and 2-3/6 late Peaking SEM radiates to carotids with delayed carotid upstroke. Rt groin without hematoma Abd: soft, nt/nd, nabs   Rate: 56  Rhythm: normal sinus rhythm and sinus bradycardia  Lab Results:  Basename 12/17/11 0500  WBC 7.5  HGB 12.7*  PLT 252    Basename 12/17/11 0500  NA 138  K 4.3  CL 101  CO2 26  GLUCOSE 102*  BUN 12  CREATININE 0.96   No results found for this basename: TROPONINI:2,CK,MB:2 in the last 72 hours Hepatic Function Panel No results found for this basename: PROT,ALBUMIN,AST,ALT,ALKPHOS,BILITOT,BILIDIR,IBILI in the last 72 hours No results found for this basename: CHOL in the last 72 hours  Basename 12/17/11 0500  INR 1.04    Imaging: Imaging results have been reviewed  Cardiac Studies:------------------------------------------------------------ Study Conclusions  - Left ventricle: The cavity size was normal. There was mild concentric hypertrophy. Systolic function was low normal. The estimated ejection fraction was in the range of 50% to 55%. There is basal to mid inferior hypokinesis. Doppler parameters are consistent with abnormal left ventricular relaxation (grade 1 diastolic dysfunction). The  E/e' ratio is ~10, suggesting borderline elevated LV filling pressure. - Aortic valve: Calcified with restricted leaflet motion. There is severe aortic stenosis. The peak and mean gradients are 63 mmHg and 34 mmHg, respectively. Based on an LVOT diameter of 1.9 cm, the calculated AVA is ~0.9-1.0 cm2. There is trace aortic insufficiency. Valve area: 0.99cm^2(VTI). Valve area: 0.91cm^2 (Vmax). - Mitral valve: Calcified annulus. Trace to mild regurgitation. - Left atrium: Moderately dilated (37 ml/m2). - Atrial septum: No defect or patent foramen ovale was identified. - Systemic veins: The IVC was not well visualized. Transthoracic echocardiography. M-mode, complete 2D, spectral Doppler, and color Doppler. Height: Height: 170.2cm. Height: 67in. Weight: Weight: 106.1kg. Weight: 233.5lb. Body mass index: BMI: 36.6kg/m^2. Body surface area: BSA: 2.18m^2. Blood pressure: 158/74. Patient status: Inpatient. Location: Bedside.    Assessment/Plan:   Principal Problem:  *Syncope and collapse Active Problems:  Hypertension, on Avapro/HCTZ  Aortic stenosis, severe by echo  Hyperlipidemia  Constipation   Plan- MD to see, will saline lock IV.    Corine Shelter PA-C 12/18/2011, 10:53 AM  ATTENDING ATTESTATION:  I have seen and examined the patient along with Corine Shelter, PA (my exam above).  I have reviewed the chart, notes and new data.  I agree with Luke's note.  Brief Description: Very pleasant 78 y/o man admitted for syncope in the setting of dehydration  & severe constipation with straining -- complicated by what looks like moderate - Severe AS (conflicting cath "pull-back" gradient  & Echo peak instantaneous gradient data) -- ambiguous as to the role this  has played with his 2 syncopal episodes.   He has had no further syncope & denies Angina or CHF Sx of DOE, PND/Orthopnea beyond gradual decline in activities.  Likely syncopal episodes was multifactorial with LVH, AS & dehydration  compounded by BM related strain in leading to neurocardiogenic / vaso-vagal syncope.  His BP remains elevated  PLAN:  D/c today with more aggressive ARB dose (will double ARB dose - & likely triple as OP) while d/c'ing diuretic to avoid dehydration  Discussed "red flag" symptoms in detail for "symptomatic AS"  Will f/u with [facility].  Pt has had recommendation for future CT Surgeon from family friend who is CT Surgeon --- Dr. Donata Clay  The timing of Sgx is the issue -- has many overseas trips planned to deal with financial / property issues in [place].   Time with patient > 45 minutes with long ? & answer session.  Marykay Lex, M.D., M.S. [facility] [address] [place], [place]  [address]  [phone]  12/18/2011 2:00 PM

## 2011-12-18 NOTE — Discharge Instructions (Signed)
Call [facility] if any bleeding, swelling or drainage at cath site.  May shower, no tub baths for 48 hours for groin sticks.   No driving for 2 days  No lifting over 5 pounds for 2 days

## 2011-12-20 DIAGNOSIS — I359 Nonrheumatic aortic valve disorder, unspecified: Secondary | ICD-10-CM | POA: Diagnosis not present

## 2011-12-20 DIAGNOSIS — I10 Essential (primary) hypertension: Secondary | ICD-10-CM | POA: Diagnosis not present

## 2011-12-21 NOTE — Discharge Summary (Signed)
Physician Discharge Summary  Patient ID: Andrew Bonilla MRN: [id number] DOB/AGE: 1932-11-09 78 y.o.  Admit date: 12/14/2011 Discharge date: 12/18/2011  Discharge Diagnoses:  Principal Problem:  *Syncope and collapse, combination of dehydration, vasovagel and aortic stenosis Active Problems:  Hypertension  Hyperlipidemia  Constipation  Aortic stenosis, severe by echo   Discharged Condition: good  Procedures: 12/17/2011 combine left heart cath by Dr. Nicki Guadalajara.  12/17/2011 right heart cath by Dr. Nicki Guadalajara.  Hospital Course: This is a 78 year old gentleman who stated that he does have a history of bowel issues. However he's been severely constipated in the past 3 days. 12/15/2011 he spent all day trying to go to bathroom without success, he syncopized while attempting to go. His wife later went out and bought a suppository which he used with great success. He went to sleep and woke 3 hours later, while doing chores around the house he again syncopized. He wasn't out for any significant length of time. The patient denies any cough, palpitations, lightheadedness. In addition the patient has a significant persistent cough. It is nonproductive. This has been present for approximately 4 weeks. He does not report of any fevers, chills. He has had nausea associated with this. His wife brought him to the ER after he syncopized second time.  past medical history significant for HTN and dyslipidemia. He has no prior history of CAD, syncope, or MI. His wife did note he has had some early exertional fatigue over the last 6 months.  Patient underwent a CT of his head which was negative his point-of-care markers troponin was negative and chest x-ray was negative.  A 2-D echo was done Revealing mild concentric LVH, EF 50-55% with mild basal to mid inferior hypokinesis, severe aortic stenosis with a calculated AVA of 0.9-1 cm2.  Once the echocardiogram was done it was felt the patient should remain in the  hospital and have a heart catheterization. After lengthy discussion with Dr. Rennis Golden, The patient and his family agreed to stay for the procedure.   Patient maintained sinus rhythm and had no further syncope.  He underwent cardiac catheterization with Dr. Nicki Guadalajara on 12/17/2011.  RESULTS: Hemodynamics:  RA 9  RV 30 /11  PA 30/17  PC 18  Pigtail catheter was able to cross valve without any difficulty; arguing against severe AS. AV calcified with at least moderately reduced excursion.  Pull back pressures: LV 187/14  Ao: 164/78  P-P AVG 23, Mean 29  AVA 1.2 cm2  Minimal nonobstructive CAD with 20 % LAD stenosis, 30% OM and 20% LCX, and 20% RCA.  Of note, patient's syncopal spells were at both times associated with significant straining (constipated trying to have a BM, and trying to void), question precipitating valsalva or vasovagal rather than due to AS  12/18/2011 patient was stable and ambulating without complications.  It was felt the syncopal episodes were multifactorial with LVH, aortic stenosis and dehydration compounded by BM related to straining leading to neurocardiogenic/vasovagal syncope. He was seen by Dr. Herbie Baltimore on the day of discharge that [facility] discussed with the patient and family red flag symptoms in detail for symptomatic aortic stenosis. He also reviewed medications with the patient and the patient's family.  Patient has a recommendation for future cardiothoracic surgeon from a family friend who is also a CT Surgeon,  if they need surgery they would prefer to see Dr. Donata Clay.  The patient will followup with Dr. Rennis Golden.  Medication adjustments were made including aspirin and Diovan. The  hydrochlorothiazide portion of his blood pressure medications was DC'd.   Consults: cardiology  Significant Diagnostic Studies:  Labs at discharge sodium 138 potassium 4.3 chloride 101 CO2 25 BUN 13 creatinine 0.84 calcium 9.2 glucose 131  AST 27 ALT 29  Troponin I 0.02  Hemoglobin  12.7 hematocrit 35.3 WBC 7.5 platelets 252  UA was clear,  He did have a heme positive stool.  CT of the head without contrast: 1. No acute intracranial abnormality.  2. Mild diffuse cerebral atrophy.   2-D echocardiogram: Left ventricle: The cavity size was normal. There was mild concentric hypertrophy. Systolic function was low normal. The estimated ejection fraction was in the range of 50% to 55%. There is basal to mid inferior hypokinesis. Doppler parameters are consistent with abnormal left ventricular relaxation (grade 1 diastolic dysfunction). The E/e' ratio is ~10, suggesting borderline elevated LV filling pressure. - Aortic valve: Calcified with restricted leaflet motion. There is severe aortic stenosis. The peak and mean gradients are 63 mmHg and 34 mmHg, respectively. Based on an LVOT diameter of 1.9 cm, the calculated AVA is ~0.9-1.0 cm2. There is trace aortic insufficiency. Valve area: 0.99cm^2(VTI). Valve area: 0.91cm^2 (Vmax). - Mitral valve: Calcified annulus. Trace to mild regurgitation. - Left atrium: Moderately dilated (37 ml/m2). - Atrial septum: No defect or patent foramen ovale was identified. - Systemic veins: The IVC was not well visualized  Abdominal film and acute chest x-ray: Findings: Lungs are essentially clear. No pleural effusion or  pneumothorax.  Cardiomediastinal silhouette is within normal limits.  Nonobstructive bowel gas pattern.  No evidence of free air under the diaphragm on the upright view.  Mild degenerative changes of the visualized thoracolumbar spine.  IMPRESSION:  No evidence of acute cardiopulmonary disease.  No evidence of small bowel obstruction or free    Discharge Exam: Blood pressure 153/66, pulse 57, temperature 97.9 F (36.6 C), temperature source Oral, resp. rate 18, height 5\' 7"  (1.702 m), weight 105.915 kg (233 lb 8 oz), SpO2 98.00%.   Physical Exam:  General appearance: alert, cooperative, no distress and  moderately obese  Lungs: clear to auscultation bilaterally  Heart: regular rate and rhythm and 2-3/6 late Peaking SEM radiates to carotids with delayed carotid upstroke.  Rt groin without hematoma  Abd: soft, nt/nd, nabs  Disposition: 01-Home or Self Care  Discharge Orders    Future Orders Please Complete By Expires   Diet - low sodium heart healthy      Increase activity slowly      Discharge instructions      Comments:   Please donot drive while taking tesselon and cough syrup with codeine     Medication List  As of 12/21/2011  4:33 PM   STOP taking these medications         azithromycin 250 MG tablet      valsartan-hydrochlorothiazide 80-12.5 MG per tablet         TAKE these medications         acetaminophen 325 MG tablet   Commonly known as: TYLENOL   Take 2 tablets (650 mg total) by mouth every 4 (four) hours as needed for pain.      aspirin 325 MG EC tablet   Take 1 tablet (325 mg total) by mouth daily.      atorvastatin 40 MG tablet   Commonly known as: LIPITOR   Take 40 mg by mouth daily.      benzonatate 100 MG capsule   Commonly known as: TESSALON  Take 1 capsule (100 mg total) by mouth 3 (three) times daily as needed for cough. For cough      bisacodyl 10 MG suppository   Commonly known as: DULCOLAX   Place 10 mg rectally as needed.      esomeprazole 40 MG capsule   Commonly known as: NEXIUM   Take 40 mg by mouth daily before breakfast.      EX-LAX PO   Take 1 tablet by mouth daily.      guaiFENesin-codeine 100-10 MG/5ML syrup   Take 10 mLs by mouth every 6 (six) hours as needed for cough.      valsartan 160 MG tablet   Commonly known as: DIOVAN   Take 1 tablet (160 mg total) by mouth daily.           Follow-up Information    Follow up with Georgann Housekeeper, MD. Schedule an appointment as soon as possible for a visit in 2 weeks. (for hospital follow-up)       Follow up with Chrystie Nose, MD on 01/03/2012. (at 2:30 pm)    Contact  information:   [address] [place] [address] [phone]         Discharge instructions: Call [facility] if any bleeding, swelling or drainage at cath site.  May shower, no tub baths for 48 hours for groin sticks.   No driving for 2 days  No lifting over 5 pounds for 2 days  Signed: Leone Brand 12/21/2011, 4:33 PM   I saw Mr. Hickox on the day of discharge.  He had no further syncope and no arrhythmias. After a long discussion as to the potential upcoming Aortic Valve Surgery and key warning symptoms, he and his wife were satisfied with the planned course of action.   He was stable for discharge with the diagnosis of Moderate-to-Severe (albieit closer to severe) Aortic Stenosis to be followed up closely by Dr. Rennis Golden.  They have a family friend who is a Cardiac Surgeon who recommended Dr. Donata Clay for the patient's pending valve replacement.  I agree with Laura's note above.  Marykay Lex, M.D., M.S. [facility] [address] [place], [place]  [address]  [phone] Pager # [phone]  12/23/2011 11:31 AM

## 2012-01-02 HISTORY — PX: TEE WITHOUT CARDIOVERSION: SHX5443

## 2012-01-03 DIAGNOSIS — I359 Nonrheumatic aortic valve disorder, unspecified: Secondary | ICD-10-CM | POA: Diagnosis not present

## 2012-01-03 DIAGNOSIS — I6529 Occlusion and stenosis of unspecified carotid artery: Secondary | ICD-10-CM | POA: Diagnosis not present

## 2012-01-03 DIAGNOSIS — E782 Mixed hyperlipidemia: Secondary | ICD-10-CM | POA: Diagnosis not present

## 2012-01-07 ENCOUNTER — Encounter: Payer: Medicare Other | Admitting: Cardiothoracic Surgery

## 2012-01-09 ENCOUNTER — Encounter: Payer: Self-pay | Admitting: Cardiothoracic Surgery

## 2012-01-09 ENCOUNTER — Institutional Professional Consult (permissible substitution) (INDEPENDENT_AMBULATORY_CARE_PROVIDER_SITE_OTHER): Payer: Medicare Other | Admitting: Cardiothoracic Surgery

## 2012-01-09 ENCOUNTER — Encounter: Payer: Medicare Other | Admitting: Cardiothoracic Surgery

## 2012-01-09 VITALS — BP 140/75 | HR 54 | Resp 18 | Ht 67.0 in | Wt 228.0 lb

## 2012-01-09 DIAGNOSIS — I350 Nonrheumatic aortic (valve) stenosis: Secondary | ICD-10-CM

## 2012-01-09 DIAGNOSIS — R55 Syncope and collapse: Secondary | ICD-10-CM

## 2012-01-09 DIAGNOSIS — I359 Nonrheumatic aortic valve disorder, unspecified: Secondary | ICD-10-CM | POA: Diagnosis not present

## 2012-01-09 NOTE — Patient Instructions (Signed)
See your dentist for dental cleaning before surgery Stop taking fish oil

## 2012-01-09 NOTE — Progress Notes (Signed)
PCP is Georgann Housekeeper, MD, MD Referring Provider is Chrystie Nose., MD                    [address] [place]            [place] [id number]          [phone]      Chief Complaint  Patient presents with  . Aortic Stenosis    Referral from Dr Rennis Golden for surgical eval on Aortic Stenosis, Cardiac Cath on  12/17/2011    HPI: I was asked to evaluate this 78 year old Caucasian male for aortic valve replacement after being recently diagnosed with moderate to severe aortic stenosis. In mid April last month he had 2 episodes of syncope. He is also had progressive decrease in exercise tolerance and increasing dyspnea on exertion. He has had a known murmur of aortic stenosis since 2003 which by echo previously had not been significant. He has no history of arrhythmia. He was admitted to the hospital and a head CT scan was negative. Carotid duplex exam showed no significant disease. He was noted to have a cardiac murmur and a 2-D echo was performed showing moderate to severe aortic stenosis with a valve area of 0.9. There is no previous history of rheumatic heart disease. There's no family history of aortic stenosis or aortic valve replacement surgery. The patient was evaluated by Dr. Rennis Golden and a cardiac catheterization was performed. This showed normal coronaries with a calcified aortic valve good LV function no significant AI and a area of by cath of a 1.2. Because of his episodes of syncope and a significant 40 mm mercury gradient by echo as well as fairly significant calcification the valve leaflets aortic valve replacement was felt to be indicated.   Past Medical History  Diagnosis Date  . Hyperlipemia   . Hypertension   . Constipation   . Hard of hearing   . Generalized headaches     No past surgical history on file.  Family History  Problem Relation Age of Onset  . Coronary artery disease Father 47    MI/smoker  . Colon cancer      Social History History  Substance Use  Topics  . Smoking status: Never Smoker   . Smokeless tobacco: Not on file  . Alcohol Use: No    Current Outpatient Prescriptions  Medication Sig Dispense Refill  . aspirin 81 MG tablet Take 81 mg by mouth daily.      Marland Kitchen atorvastatin (LIPITOR) 40 MG tablet Take 40 mg by mouth daily.      . benzonatate (TESSALON) 200 MG capsule Take 200 mg by mouth 3 (three) times daily as needed.      Marland Kitchen esomeprazole (NEXIUM) 40 MG capsule Take 40 mg by mouth daily before breakfast.      . Lactobacillus (ACIDOPHILUS) TABS Take by mouth 1 day or 1 dose.      . polyethylene glycol (MIRALAX / GLYCOLAX) packet Take 17 g by mouth daily.      . Sennosides (EX-LAX PO) Take 1 tablet by mouth daily.      . tadalafil (CIALIS) 10 MG tablet Take 10 mg by mouth daily as needed.      . valsartan (DIOVAN) 160 MG tablet Take 1 tablet (160 mg total) by mouth daily.  30 tablet  11  . acetaminophen (TYLENOL) 325 MG tablet Take 2 tablets (650 mg total) by mouth every 4 (four) hours as needed for pain.      Marland Kitchen  bisacodyl (DULCOLAX) 10 MG suppository Place 10 mg rectally as needed.        No Known Allergies  Review of Systems Constitutional review negative for fever weight loss HEENT review is negative for change in vision or dental complaints he was last seen his dentist one year ago Thorax no history of pneumothorax rib fracture trauma or pneumonia Cardiac positive history rhythm no arrhythmia normal coronaries by cath normal LV systolic function GI history of constipation Endocrine negative for diabetes Vascular no DVT claudication Hematologic no bleeding disorder Neurologic no stroke or seizure, history of congenital deafness left ear  BP 140/75  Pulse 54  Resp 18  Ht 5\' 7"  (1.702 m)  Wt 228 lb (103.42 kg)  BMI 35.71 kg/m2  SpO2 98% Physical Exam General appearance obese pleasant well-developed 78 year old gentleman no acute distress accompanied by his wife HEENT normocephalic pupils equal dentition good Neck  without JVD mass or adenopathy Thorax no deformities tenderness breath sounds clear Cardiac regular rhythm grade 3/6 systolic ejection murmur right upper sternal border no diastolic murmur nor gallop Abdomen soft obese nontender Extremities no clubbing cyanosis edema Vascular good pulses no venous insufficiency Neurologic intact  Diagnostic Tests: Cardiac catheterization in 2-D echo reviewed. Results consistent with moderate to severe aortic stenosis  Impression: Symptomatic aortic stenosis recommend aortic valve replacement with a bioprosthetic valve  Plan: Bioprosthetic aVR May 16 at [place]. Operative indications benefits and expected recovery as well as the potential risks discussed with patient and wife and he understands and agrees to proceed

## 2012-01-10 ENCOUNTER — Other Ambulatory Visit: Payer: Self-pay | Admitting: Cardiothoracic Surgery

## 2012-01-10 DIAGNOSIS — I359 Nonrheumatic aortic valve disorder, unspecified: Secondary | ICD-10-CM

## 2012-01-11 ENCOUNTER — Other Ambulatory Visit: Payer: Self-pay

## 2012-01-11 DIAGNOSIS — I359 Nonrheumatic aortic valve disorder, unspecified: Secondary | ICD-10-CM

## 2012-01-14 ENCOUNTER — Encounter (HOSPITAL_COMMUNITY): Payer: Self-pay

## 2012-01-14 ENCOUNTER — Ambulatory Visit (HOSPITAL_COMMUNITY)
Admission: RE | Admit: 2012-01-14 | Discharge: 2012-01-14 | Disposition: A | Discharge status: Home / Self Care | Payer: Medicare Other | Source: Ambulatory Visit | Attending: Cardiothoracic Surgery | Admitting: Cardiothoracic Surgery

## 2012-01-14 ENCOUNTER — Encounter (HOSPITAL_COMMUNITY): Payer: Self-pay | Admitting: Pharmacy Technician

## 2012-01-14 ENCOUNTER — Encounter (HOSPITAL_COMMUNITY)
Admission: RE | Admit: 2012-01-14 | Discharge: 2012-01-14 | Disposition: A | Discharge status: Home / Self Care | Payer: Medicare Other | Source: Ambulatory Visit | Attending: Cardiothoracic Surgery | Admitting: Cardiothoracic Surgery

## 2012-01-14 ENCOUNTER — Inpatient Hospital Stay (HOSPITAL_COMMUNITY): Admission: RE | Admit: 2012-01-14 | Payer: Medicare Other | Source: Ambulatory Visit

## 2012-01-14 ENCOUNTER — Other Ambulatory Visit: Payer: Self-pay | Admitting: Cardiothoracic Surgery

## 2012-01-14 VITALS — BP 155/67 | HR 48 | Temp 97.0°F | Resp 18 | Ht 67.0 in | Wt 237.7 lb

## 2012-01-14 DIAGNOSIS — I359 Nonrheumatic aortic valve disorder, unspecified: Secondary | ICD-10-CM

## 2012-01-14 DIAGNOSIS — Z01812 Encounter for preprocedural laboratory examination: Secondary | ICD-10-CM | POA: Insufficient documentation

## 2012-01-14 DIAGNOSIS — Z01810 Encounter for preprocedural cardiovascular examination: Secondary | ICD-10-CM

## 2012-01-14 DIAGNOSIS — I350 Nonrheumatic aortic (valve) stenosis: Secondary | ICD-10-CM

## 2012-01-14 DIAGNOSIS — Z01818 Encounter for other preprocedural examination: Secondary | ICD-10-CM | POA: Insufficient documentation

## 2012-01-14 HISTORY — DX: Acute upper respiratory infection, unspecified: J06.9

## 2012-01-14 HISTORY — DX: Unspecified hearing loss, left ear: H91.92

## 2012-01-14 HISTORY — DX: Cardiac murmur, unspecified: R01.1

## 2012-01-14 HISTORY — DX: Benign prostatic hyperplasia without lower urinary tract symptoms: N40.0

## 2012-01-14 HISTORY — DX: Gastro-esophageal reflux disease without esophagitis: K21.9

## 2012-01-14 LAB — BLOOD GAS, ARTERIAL
Acid-base deficit: 0.5 mmol/L (ref 0.0–2.0)
Bicarbonate: 23 mEq/L (ref 20.0–24.0)
Drawn by: 344381
O2 Saturation: 93.8 %
Patient temperature: 98.6
TCO2: 24 mmol/L (ref 0–100)
pCO2 arterial: 33.2 mmHg — ABNORMAL LOW (ref 35.0–45.0)
pH, Arterial: 7.455 — ABNORMAL HIGH (ref 7.350–7.450)
pO2, Arterial: 66.1 mmHg — ABNORMAL LOW (ref 80.0–100.0)

## 2012-01-14 LAB — HEMOGLOBIN A1C
Hgb A1c MFr Bld: 5.5 % (ref <5.7)
Mean Plasma Glucose: 111 mg/dL (ref <117)

## 2012-01-14 LAB — COMPREHENSIVE METABOLIC PANEL
ALT: 21 U/L (ref 0–53)
AST: 28 U/L (ref 0–37)
Albumin: 4 g/dL (ref 3.5–5.2)
Alkaline Phosphatase: 55 U/L (ref 39–117)
BUN: 14 mg/dL (ref 6–23)
CO2: 22 mEq/L (ref 19–32)
Calcium: 9.2 mg/dL (ref 8.4–10.5)
Chloride: 97 mEq/L (ref 96–112)
Creatinine, Ser: 0.83 mg/dL (ref 0.50–1.35)
GFR calc Af Amer: >90 mL/min (ref >90)
GFR calc non Af Amer: 83 mL/min — ABNORMAL LOW (ref >90)
Glucose, Bld: 115 mg/dL — ABNORMAL HIGH (ref 70–99)
Potassium: 4.3 mEq/L (ref 3.5–5.1)
Sodium: 128 mEq/L — ABNORMAL LOW (ref 135–145)
Total Bilirubin: 0.4 mg/dL (ref 0.3–1.2)
Total Protein: 6.9 g/dL (ref 6.0–8.3)

## 2012-01-14 LAB — URINALYSIS, ROUTINE W REFLEX MICROSCOPIC
Bilirubin Urine: NEGATIVE
Glucose, UA: NEGATIVE mg/dL
Hgb urine dipstick: NEGATIVE
Ketones, ur: NEGATIVE mg/dL
Leukocytes, UA: NEGATIVE
Nitrite: NEGATIVE
Protein, ur: NEGATIVE mg/dL
Specific Gravity, Urine: 1.011 (ref 1.005–1.030)
Urobilinogen, UA: 0.2 mg/dL (ref 0.0–1.0)
pH: 7 (ref 5.0–8.0)

## 2012-01-14 LAB — CBC
HCT: 37.9 % — ABNORMAL LOW (ref 39.0–52.0)
Hemoglobin: 13.2 g/dL (ref 13.0–17.0)
MCH: 30.1 pg (ref 26.0–34.0)
MCHC: 34.8 g/dL (ref 30.0–36.0)
MCV: 86.5 fL (ref 78.0–100.0)
Platelets: 243 10*3/uL (ref 150–400)
RBC: 4.38 MIL/uL (ref 4.22–5.81)
RDW: 13.1 % (ref 11.5–15.5)
WBC: 8.5 10*3/uL (ref 4.0–10.5)

## 2012-01-14 LAB — PROTIME-INR
INR: 1.09 (ref 0.00–1.49)
Prothrombin Time: 14.3 seconds (ref 11.6–15.2)

## 2012-01-14 LAB — TYPE AND SCREEN
ABO/RH(D): A POS
Antibody Screen: NEGATIVE

## 2012-01-14 LAB — ABO/RH: ABO/RH(D): A POS

## 2012-01-14 LAB — SURGICAL PCR SCREEN
MRSA, PCR: NEGATIVE
Staphylococcus aureus: NEGATIVE

## 2012-01-14 LAB — APTT: aPTT: 36 seconds (ref 24–37)

## 2012-01-14 NOTE — Progress Notes (Signed)
ALLISON ZELENAK REVIEWED EKG.

## 2012-01-14 NOTE — Pre-Procedure Instructions (Signed)
20 NASIM HABEEB  01/14/2012   Your procedure is scheduled on: Thursday 01/17/12    Report to [facility] at 530 AM.  Call this number if you have problems the morning of surgery: [phone]   Remember:   Do not eat food:After Midnight.  May have clear liquids: up to 4 Hours before arrival.(UNTIL 130 AM)  Clear liquids include soda, tea, black coffee, apple or grape juice, broth.  Take these medicines the morning of surgery with A SIP OF WATER: NEXIUM (STOP PLAVIX, NSAIDS AT LEAST 7 DAYS PRIOR TO SURGERY )    Do not wear jewelry, make-up or nail polish.  Do not wear lotions, powders, or perfumes. You may wear deodorant.  Do not shave 48 hours prior to surgery. Men may shave face and neck.  Do not bring valuables to the hospital.  Contacts, dentures or bridgework may not be worn into surgery.  Leave suitcase in the car. After surgery it may be brought to your room.  For patients admitted to the hospital, checkout time is 11:00 AM the day of discharge.   Patients discharged the day of surgery will not be allowed to drive home.  Name and phone number of your driver:   Special Instructions: CHG Shower Use Special Wash: 1/2 bottle night before surgery and 1/2 bottle morning of surgery.   Please read over the following fact sheets that you were given: Pain Booklet, MRSA Information and Surgical Site Infection Prevention

## 2012-01-14 NOTE — Progress Notes (Signed)
Pre-op Cardiac Surgery  Carotid Findings:  Right - No evidence of significant ICA stenosis. Vertebral artery flow is antegrade. Left - 40% to 59% upper end of range ICA stenosis. Vertebral artery flow is antegrade.  Upper Extremity Right Left  Brachial Pressures 136 Triphasic 133 Triphasic  Radial Waveforms Triphasic Triphasic  Ulnar Waveforms Triphasic Triphasic  Palmar Arch (Allen's Test) Normal Normal   Findings:  Doppler waveforms remained normal bilaterally with radial and ulnar compressions    Lower  Extremity Right Left  Dorsalis Pedis    Anterior Tibial    Posterior Tibial    Ankle/Brachial Indices      Findings:  N/A

## 2012-01-15 ENCOUNTER — Other Ambulatory Visit (HOSPITAL_COMMUNITY): Payer: Medicare Other

## 2012-01-15 ENCOUNTER — Ambulatory Visit (HOSPITAL_COMMUNITY)
Admission: RE | Admit: 2012-01-15 | Discharge: 2012-01-15 | Disposition: A | Discharge status: Home / Self Care | Payer: Medicare Other | Source: Ambulatory Visit | Attending: Cardiothoracic Surgery | Admitting: Cardiothoracic Surgery

## 2012-01-15 DIAGNOSIS — I359 Nonrheumatic aortic valve disorder, unspecified: Secondary | ICD-10-CM

## 2012-01-15 DIAGNOSIS — Z01818 Encounter for other preprocedural examination: Secondary | ICD-10-CM | POA: Diagnosis not present

## 2012-01-15 LAB — PULMONARY FUNCTION TEST

## 2012-01-15 MED ORDER — ALBUTEROL SULFATE (5 MG/ML) 0.5% IN NEBU
2.5000 mg | INHALATION_SOLUTION | Freq: Once | RESPIRATORY_TRACT | Status: AC
  Administered 2012-01-15: 2.5 mg via RESPIRATORY_TRACT

## 2012-01-15 NOTE — Consult Note (Signed)
Anesthesia Chart Review:  Patient is a 78 year old male scheduled for AV replacement on 01/17/12 for moderate to severe AS with history of syncope and decreased exercise tolerance.  Other history includes non-smoker, HLD, BPH, GERD, HTN, left ear deafness.  Cardiologist is Dr. Rennis Golden [facility]).  EKG from 01/14/12 showed SB, nonspecific ST/T wave abnormality, baseline wanderer.  Cath from 12/17/11 showed: Hemodynamics:  RA 9  RV 30 /11  PA 30/17  PC 18  Pigtail catheter was able to cross valve without any difficulty; arguing against severe AS. AV calcified with at least moderately reduced excursion.  Pull back pressures: LV 187/14  Ao: 164/78  P-P AVG 23, Mean 29  AVA 1.2 cm2  Minimal nonobstructive CAD with 20 % LAD stenosis, 30% OM and 20% LCX, and 20% RCA.   2D echo from 12/15/11 showed: - Left ventricle: The cavity size was normal. There was mild concentric hypertrophy. Systolic function was low normal. The estimated ejection fraction was in the range of 50% to 55%. There is basal to mid inferior hypokinesis. Doppler parameters are consistent with abnormal left ventricular relaxation (grade 1 diastolic dysfunction). The E/e' ratio is ~10, suggesting borderline elevated LV filling pressure. - Aortic valve: Calcified with restricted leaflet motion. There is severe aortic stenosis. The peak and mean gradients are 63 mmHg and 34 mmHg, respectively. Based on an LVOT diameter of 1.9 cm, the calculated AVA is ~0.9-1.0 cm2. There is trace aortic insufficiency. Valve area: 0.99cm^2(VTI). Valve area: 0.91cm^2 (Vmax). - Mitral valve: Calcified annulus. Trace to mild regurgitation. - Left atrium: Moderately dilated (37 ml/m2). - Atrial septum: No defect or patent foramen ovale was identified. - Systemic veins: The IVC was not well visualized.  CXR from 01/14/12 showed no acute cardiopulmonary process.  Labs noted.  Na 128 (down from 138 on 12/17/11).  Repeat BMET on arrival.  He is on an ARB, but  I do not see any diuretics on his medication list.  Carotid duplex on 01/14/12 showed 40-59% left ICA stenosis, and no significant stenosis on the right.  It appears PFTs are scheduled for today, 01/15/12.  I've asked the nursing staff to follow-up on these results and to request his latest records from [facility].  Shonna Chock, PA-C

## 2012-01-15 NOTE — Progress Notes (Signed)
Requested records form [facility], spoke to [place].

## 2012-01-16 DIAGNOSIS — Z952 Presence of prosthetic heart valve: Secondary | ICD-10-CM

## 2012-01-16 HISTORY — DX: Presence of prosthetic heart valve: Z95.2

## 2012-01-16 MED ORDER — PHENYLEPHRINE HCL 10 MG/ML IJ SOLN
30.0000 ug/min | INTRAVENOUS | Status: AC
  Administered 2012-01-17: 5 ug via INTRAVENOUS
  Filled 2012-01-16: qty 2

## 2012-01-16 MED ORDER — VANCOMYCIN HCL 1000 MG IV SOLR
1500.0000 mg | INTRAVENOUS | Status: AC
  Administered 2012-01-17: 1500 mg via INTRAVENOUS
  Filled 2012-01-16: qty 1500

## 2012-01-16 MED ORDER — DEXTROSE 5 % IV SOLN
750.0000 mg | INTRAVENOUS | Status: DC
  Filled 2012-01-16: qty 750

## 2012-01-16 MED ORDER — SODIUM CHLORIDE 0.9 % IV SOLN
1.5000 mg/kg/h | INTRAVENOUS | Status: AC
  Administered 2012-01-17: 1.5 mg/kg/h via INTRAVENOUS
  Filled 2012-01-16: qty 25

## 2012-01-16 MED ORDER — SODIUM CHLORIDE 0.9 % IV SOLN
0.1000 ug/kg/h | INTRAVENOUS | Status: AC
  Administered 2012-01-17: .2 ug/kg/h via INTRAVENOUS
  Filled 2012-01-16: qty 4

## 2012-01-16 MED ORDER — TRANEXAMIC ACID (OHS) PUMP PRIME SOLUTION
2.0000 mg/kg | INTRAVENOUS | Status: DC
  Filled 2012-01-16: qty 2.16

## 2012-01-16 MED ORDER — NITROGLYCERIN IN D5W 200-5 MCG/ML-% IV SOLN
2.0000 ug/min | INTRAVENOUS | Status: AC
  Administered 2012-01-17: 5 ug/min via INTRAVENOUS
  Filled 2012-01-16: qty 250

## 2012-01-16 MED ORDER — SODIUM BICARBONATE 8.4 % IV SOLN
INTRAVENOUS | Status: DC
  Filled 2012-01-16: qty 2.5

## 2012-01-16 MED ORDER — POTASSIUM CHLORIDE 2 MEQ/ML IV SOLN
80.0000 meq | INTRAVENOUS | Status: DC
  Filled 2012-01-16: qty 40

## 2012-01-16 MED ORDER — MAGNESIUM SULFATE 50 % IJ SOLN
40.0000 meq | INTRAMUSCULAR | Status: DC
  Filled 2012-01-16: qty 10

## 2012-01-16 MED ORDER — TRANEXAMIC ACID (OHS) BOLUS VIA INFUSION
15.0000 mg/kg | INTRAVENOUS | Status: AC
  Administered 2012-01-17: 1617 mg via INTRAVENOUS
  Filled 2012-01-16: qty 1617

## 2012-01-16 MED ORDER — DOPAMINE-DEXTROSE 3.2-5 MG/ML-% IV SOLN
2.0000 ug/kg/min | INTRAVENOUS | Status: DC
  Filled 2012-01-16: qty 250

## 2012-01-16 MED ORDER — SODIUM CHLORIDE 0.9 % IV SOLN
INTRAVENOUS | Status: AC
  Administered 2012-01-17: 1.4 [IU]/h via INTRAVENOUS
  Filled 2012-01-16: qty 1

## 2012-01-16 MED ORDER — DEXTROSE 5 % IV SOLN
1.5000 g | INTRAVENOUS | Status: AC
  Administered 2012-01-17: .75 g via INTRAVENOUS
  Administered 2012-01-17: 1.5 g via INTRAVENOUS
  Filled 2012-01-16: qty 1.5

## 2012-01-16 MED ORDER — METOPROLOL TARTRATE 12.5 MG HALF TABLET: 12.5000 mg | ORAL_TABLET | Freq: Once | ORAL | Status: DC

## 2012-01-16 MED ORDER — EPINEPHRINE HCL 1 MG/ML IJ SOLN
0.5000 ug/min | INTRAVENOUS | Status: DC
  Filled 2012-01-16: qty 4

## 2012-01-17 ENCOUNTER — Ambulatory Visit (HOSPITAL_COMMUNITY): Payer: Medicare Other | Admitting: Vascular Surgery

## 2012-01-17 ENCOUNTER — Encounter (HOSPITAL_COMMUNITY)
Admission: RE | Disposition: A | Discharge status: Home / Self Care | Payer: Self-pay | Source: Ambulatory Visit | Attending: Cardiothoracic Surgery

## 2012-01-17 ENCOUNTER — Inpatient Hospital Stay (HOSPITAL_COMMUNITY)
Admission: RE | Admit: 2012-01-17 | Discharge: 2012-01-21 | DRG: 220 | Disposition: A | Discharge status: Home / Self Care | Payer: Medicare Other | Source: Ambulatory Visit | Attending: Cardiothoracic Surgery | Admitting: Cardiothoracic Surgery

## 2012-01-17 ENCOUNTER — Encounter (HOSPITAL_COMMUNITY): Payer: Self-pay | Admitting: Vascular Surgery

## 2012-01-17 ENCOUNTER — Inpatient Hospital Stay (HOSPITAL_COMMUNITY): Payer: Medicare Other

## 2012-01-17 DIAGNOSIS — D696 Thrombocytopenia, unspecified: Secondary | ICD-10-CM | POA: Diagnosis not present

## 2012-01-17 DIAGNOSIS — I359 Nonrheumatic aortic valve disorder, unspecified: Principal | ICD-10-CM | POA: Diagnosis present

## 2012-01-17 DIAGNOSIS — J984 Other disorders of lung: Secondary | ICD-10-CM | POA: Diagnosis not present

## 2012-01-17 DIAGNOSIS — Z09 Encounter for follow-up examination after completed treatment for conditions other than malignant neoplasm: Secondary | ICD-10-CM | POA: Diagnosis not present

## 2012-01-17 DIAGNOSIS — R141 Gas pain: Secondary | ICD-10-CM | POA: Diagnosis not present

## 2012-01-17 DIAGNOSIS — J9819 Other pulmonary collapse: Secondary | ICD-10-CM | POA: Diagnosis not present

## 2012-01-17 DIAGNOSIS — J90 Pleural effusion, not elsewhere classified: Secondary | ICD-10-CM | POA: Diagnosis not present

## 2012-01-17 DIAGNOSIS — Z7982 Long term (current) use of aspirin: Secondary | ICD-10-CM | POA: Diagnosis not present

## 2012-01-17 DIAGNOSIS — I10 Essential (primary) hypertension: Secondary | ICD-10-CM | POA: Diagnosis not present

## 2012-01-17 DIAGNOSIS — D62 Acute posthemorrhagic anemia: Secondary | ICD-10-CM | POA: Diagnosis not present

## 2012-01-17 DIAGNOSIS — R143 Flatulence: Secondary | ICD-10-CM | POA: Diagnosis not present

## 2012-01-17 DIAGNOSIS — Z954 Presence of other heart-valve replacement: Secondary | ICD-10-CM | POA: Diagnosis not present

## 2012-01-17 DIAGNOSIS — Z952 Presence of prosthetic heart valve: Secondary | ICD-10-CM

## 2012-01-17 DIAGNOSIS — E785 Hyperlipidemia, unspecified: Secondary | ICD-10-CM | POA: Diagnosis not present

## 2012-01-17 DIAGNOSIS — Z79899 Other long term (current) drug therapy: Secondary | ICD-10-CM | POA: Diagnosis not present

## 2012-01-17 DIAGNOSIS — J811 Chronic pulmonary edema: Secondary | ICD-10-CM | POA: Diagnosis not present

## 2012-01-17 HISTORY — PX: AORTIC VALVE REPLACEMENT: SHX41

## 2012-01-17 LAB — GLUCOSE, CAPILLARY
Glucose-Capillary: 94 mg/dL (ref 70–99)
Glucose-Capillary: 97 mg/dL (ref 70–99)
Glucose-Capillary: 118 mg/dL — ABNORMAL HIGH (ref 70–99)
Glucose-Capillary: 129 mg/dL — ABNORMAL HIGH (ref 70–99)

## 2012-01-17 LAB — PROTIME-INR
INR: 1.81 — ABNORMAL HIGH (ref 0.00–1.49)
Prothrombin Time: 21.3 seconds — ABNORMAL HIGH (ref 11.6–15.2)

## 2012-01-17 LAB — POCT I-STAT 4, (NA,K, GLUC, HGB,HCT)
Glucose, Bld: 107 mg/dL — ABNORMAL HIGH (ref 70–99)
Glucose, Bld: 113 mg/dL — ABNORMAL HIGH (ref 70–99)
Glucose, Bld: 106 mg/dL — ABNORMAL HIGH (ref 70–99)
Glucose, Bld: 128 mg/dL — ABNORMAL HIGH (ref 70–99)
Glucose, Bld: 154 mg/dL — ABNORMAL HIGH (ref 70–99)
Glucose, Bld: 113 mg/dL — ABNORMAL HIGH (ref 70–99)
HCT: 36 % — ABNORMAL LOW (ref 39.0–52.0)
HCT: 33 % — ABNORMAL LOW (ref 39.0–52.0)
HCT: 26 % — ABNORMAL LOW (ref 39.0–52.0)
HCT: 27 % — ABNORMAL LOW (ref 39.0–52.0)
HCT: 27 % — ABNORMAL LOW (ref 39.0–52.0)
HCT: 32 % — ABNORMAL LOW (ref 39.0–52.0)
Hemoglobin: 12.2 g/dL — ABNORMAL LOW (ref 13.0–17.0)
Hemoglobin: 11.2 g/dL — ABNORMAL LOW (ref 13.0–17.0)
Hemoglobin: 8.8 g/dL — ABNORMAL LOW (ref 13.0–17.0)
Hemoglobin: 9.2 g/dL — ABNORMAL LOW (ref 13.0–17.0)
Hemoglobin: 9.2 g/dL — ABNORMAL LOW (ref 13.0–17.0)
Hemoglobin: 10.9 g/dL — ABNORMAL LOW (ref 13.0–17.0)
Potassium: 4.3 mEq/L (ref 3.5–5.1)
Potassium: 4.1 mEq/L (ref 3.5–5.1)
Potassium: 4.1 mEq/L (ref 3.5–5.1)
Potassium: 4.1 mEq/L (ref 3.5–5.1)
Potassium: 4.8 mEq/L (ref 3.5–5.1)
Potassium: 3.7 mEq/L (ref 3.5–5.1)
Sodium: 136 mEq/L (ref 135–145)
Sodium: 135 mEq/L (ref 135–145)
Sodium: 133 mEq/L — ABNORMAL LOW (ref 135–145)
Sodium: 133 mEq/L — ABNORMAL LOW (ref 135–145)
Sodium: 134 mEq/L — ABNORMAL LOW (ref 135–145)
Sodium: 136 mEq/L (ref 135–145)

## 2012-01-17 LAB — POCT I-STAT 3, ART BLOOD GAS (G3+)
Acid-base deficit: 1 mmol/L (ref 0.0–2.0)
Acid-base deficit: 3 mmol/L — ABNORMAL HIGH (ref 0.0–2.0)
Acid-base deficit: 3 mmol/L — ABNORMAL HIGH (ref 0.0–2.0)
Acid-base deficit: 4 mmol/L — ABNORMAL HIGH (ref 0.0–2.0)
Acid-base deficit: 3 mmol/L — ABNORMAL HIGH (ref 0.0–2.0)
Bicarbonate: 24.3 mEq/L — ABNORMAL HIGH (ref 20.0–24.0)
Bicarbonate: 21.9 mEq/L (ref 20.0–24.0)
Bicarbonate: 22.6 mEq/L (ref 20.0–24.0)
Bicarbonate: 21.9 mEq/L (ref 20.0–24.0)
Bicarbonate: 22.9 mEq/L (ref 20.0–24.0)
O2 Saturation: 100 %
O2 Saturation: 100 %
O2 Saturation: 97 %
O2 Saturation: 98 %
O2 Saturation: 99 %
Patient temperature: 35
Patient temperature: 36.4
Patient temperature: 36.5
TCO2: 25 mmol/L (ref 0–100)
TCO2: 23 mmol/L (ref 0–100)
TCO2: 24 mmol/L (ref 0–100)
TCO2: 23 mmol/L (ref 0–100)
TCO2: 24 mmol/L (ref 0–100)
pCO2 arterial: 40.9 mmHg (ref 35.0–45.0)
pCO2 arterial: 37.2 mmHg (ref 35.0–45.0)
pCO2 arterial: 38.2 mmHg (ref 35.0–45.0)
pCO2 arterial: 42.2 mmHg (ref 35.0–45.0)
pCO2 arterial: 43.7 mmHg (ref 35.0–45.0)
pH, Arterial: 7.381 (ref 7.350–7.450)
pH, Arterial: 7.377 (ref 7.350–7.450)
pH, Arterial: 7.37 (ref 7.350–7.450)
pH, Arterial: 7.321 — ABNORMAL LOW (ref 7.350–7.450)
pH, Arterial: 7.324 — ABNORMAL LOW (ref 7.350–7.450)
pO2, Arterial: 369 mmHg — ABNORMAL HIGH (ref 80.0–100.0)
pO2, Arterial: 227 mmHg — ABNORMAL HIGH (ref 80.0–100.0)
pO2, Arterial: 87 mmHg (ref 80.0–100.0)
pO2, Arterial: 111 mmHg — ABNORMAL HIGH (ref 80.0–100.0)
pO2, Arterial: 155 mmHg — ABNORMAL HIGH (ref 80.0–100.0)

## 2012-01-17 LAB — CBC
HCT: 29.7 % — ABNORMAL LOW (ref 39.0–52.0)
Hemoglobin: 10.6 g/dL — ABNORMAL LOW (ref 13.0–17.0)
MCH: 31 pg (ref 26.0–34.0)
MCH: 31 pg (ref 26.0–34.0)
MCHC: 35.8 g/dL (ref 30.0–36.0)
MCHC: 35.7 g/dL (ref 30.0–36.0)
MCV: 86.8 fL (ref 78.0–100.0)
Platelets: 133 10*3/uL — ABNORMAL LOW (ref 150–400)
Platelets: 137 10*3/uL — ABNORMAL LOW (ref 150–400)
RBC: 3.42 MIL/uL — ABNORMAL LOW (ref 4.22–5.81)
RDW: 13.2 % (ref 11.5–15.5)
RDW: 13.3 % (ref 11.5–15.5)
WBC: 13.2 10*3/uL — ABNORMAL HIGH (ref 4.0–10.5)

## 2012-01-17 LAB — CREATININE, SERUM
Creatinine, Ser: 0.77 mg/dL (ref 0.50–1.35)
GFR calc Af Amer: >90 mL/min (ref >90)
GFR calc non Af Amer: 85 mL/min — ABNORMAL LOW (ref >90)

## 2012-01-17 LAB — POCT I-STAT, CHEM 8
BUN: 11 mg/dL (ref 6–23)
Calcium, Ion: 1.15 mmol/L (ref 1.12–1.32)
Chloride: 105 mEq/L (ref 96–112)
Creatinine, Ser: 0.7 mg/dL (ref 0.50–1.35)
Glucose, Bld: 140 mg/dL — ABNORMAL HIGH (ref 70–99)
HCT: 30 % — ABNORMAL LOW (ref 39.0–52.0)
Hemoglobin: 10.2 g/dL — ABNORMAL LOW (ref 13.0–17.0)
Potassium: 4.5 mEq/L (ref 3.5–5.1)
Sodium: 135 mEq/L (ref 135–145)
TCO2: 23 mmol/L (ref 0–100)

## 2012-01-17 LAB — HEMOGLOBIN AND HEMATOCRIT, BLOOD
HCT: 25.4 % — ABNORMAL LOW (ref 39.0–52.0)
Hemoglobin: 9.1 g/dL — ABNORMAL LOW (ref 13.0–17.0)

## 2012-01-17 LAB — PLATELET COUNT: Platelets: 157 10*3/uL (ref 150–400)

## 2012-01-17 LAB — MAGNESIUM: Magnesium: 2.6 mg/dL — ABNORMAL HIGH (ref 1.5–2.5)

## 2012-01-17 SURGERY — REPLACEMENT, AORTIC VALVE, OPEN
Anesthesia: General | Site: Chest | Wound class: Clean

## 2012-01-17 MED ORDER — PROPOFOL 10 MG/ML IV EMUL
INTRAVENOUS | Status: DC | PRN
  Administered 2012-01-17: 40 mg via INTRAVENOUS
  Administered 2012-01-17: 50 mg via INTRAVENOUS

## 2012-01-17 MED ORDER — MORPHINE SULFATE 2 MG/ML IJ SOLN: 1.0000 mg | INTRAMUSCULAR | Status: AC | PRN

## 2012-01-17 MED ORDER — DEXTROSE 5 % IV SOLN
0.0000 ug/min | INTRAVENOUS | Status: DC
  Administered 2012-01-18: 5 ug/min via INTRAVENOUS
  Filled 2012-01-17: qty 2

## 2012-01-17 MED ORDER — PROTAMINE SULFATE 10 MG/ML IV SOLN
INTRAVENOUS | Status: DC | PRN
  Administered 2012-01-17: 50 mg via INTRAVENOUS
  Administered 2012-01-17: 300 mg via INTRAVENOUS

## 2012-01-17 MED ORDER — OXYCODONE HCL 5 MG PO TABS: 5.0000 mg | ORAL_TABLET | ORAL | Status: DC | PRN

## 2012-01-17 MED ORDER — ACETAMINOPHEN 160 MG/5ML PO SOLN: 975.0000 mg | Freq: Four times a day (QID) | ORAL | Status: DC

## 2012-01-17 MED ORDER — ATORVASTATIN CALCIUM 40 MG PO TABS
40.0000 mg | ORAL_TABLET | Freq: Every day | ORAL | Status: DC
  Administered 2012-01-18 – 2012-01-21 (×4): 40 mg via ORAL
  Filled 2012-01-17 (×4): qty 1

## 2012-01-17 MED ORDER — HEPARIN SODIUM (PORCINE) 1000 UNIT/ML IJ SOLN
INTRAMUSCULAR | Status: DC | PRN
  Administered 2012-01-17: 32000 [IU] via INTRAVENOUS

## 2012-01-17 MED ORDER — MAGNESIUM SULFATE 40 MG/ML IJ SOLN
4.0000 g | Freq: Once | INTRAMUSCULAR | Status: AC
  Administered 2012-01-17: 4 g via INTRAVENOUS
  Filled 2012-01-17: qty 100

## 2012-01-17 MED ORDER — NITROGLYCERIN IN D5W 200-5 MCG/ML-% IV SOLN: 0.0000 ug/min | INTRAVENOUS | Status: DC

## 2012-01-17 MED ORDER — SODIUM CHLORIDE 0.9 % IV SOLN: INTRAVENOUS | Status: DC

## 2012-01-17 MED ORDER — SODIUM CHLORIDE 0.9 % IJ SOLN
3.0000 mL | Freq: Two times a day (BID) | INTRAMUSCULAR | Status: DC
  Administered 2012-01-18: 3 mL via INTRAVENOUS

## 2012-01-17 MED ORDER — LACTATED RINGERS IV SOLN: 500.0000 mL | Freq: Once | INTRAVENOUS | Status: AC | PRN

## 2012-01-17 MED ORDER — METOPROLOL TARTRATE 25 MG/10 ML ORAL SUSPENSION
12.5000 mg | Freq: Two times a day (BID) | ORAL | Status: DC
  Filled 2012-01-17 (×5): qty 5

## 2012-01-17 MED ORDER — ALBUMIN HUMAN 5 % IV SOLN
250.0000 mL | INTRAVENOUS | Status: AC | PRN
  Administered 2012-01-17 (×2): 250 mL via INTRAVENOUS

## 2012-01-17 MED ORDER — METOPROLOL TARTRATE 12.5 MG HALF TABLET
12.5000 mg | ORAL_TABLET | Freq: Two times a day (BID) | ORAL | Status: DC
  Administered 2012-01-18 (×2): 12.5 mg via ORAL
  Filled 2012-01-17 (×5): qty 1

## 2012-01-17 MED ORDER — INSULIN ASPART 100 UNIT/ML ~~LOC~~ SOLN
0.0000 [IU] | SUBCUTANEOUS | Status: DC
  Administered 2012-01-18 (×3): 2 [IU] via SUBCUTANEOUS

## 2012-01-17 MED ORDER — SODIUM CHLORIDE 0.9 % IV SOLN
0.1000 ug/kg/h | INTRAVENOUS | Status: DC
  Filled 2012-01-17: qty 2

## 2012-01-17 MED ORDER — BISACODYL 5 MG PO TBEC
10.0000 mg | DELAYED_RELEASE_TABLET | Freq: Every day | ORAL | Status: DC
  Administered 2012-01-18 – 2012-01-19 (×2): 10 mg via ORAL
  Filled 2012-01-17 (×2): qty 2

## 2012-01-17 MED ORDER — SODIUM CHLORIDE 0.9 % IV SOLN
20.0000 ug | INTRAVENOUS | Status: DC | PRN
  Administered 2012-01-17: 20 ug via INTRAVENOUS

## 2012-01-17 MED ORDER — LACTATED RINGERS IV SOLN: INTRAVENOUS | Status: DC

## 2012-01-17 MED ORDER — ACETAMINOPHEN 500 MG PO TABS
1000.0000 mg | ORAL_TABLET | Freq: Four times a day (QID) | ORAL | Status: DC
  Administered 2012-01-17 – 2012-01-19 (×6): 1000 mg via ORAL
  Administered 2012-01-19: 18:00:00 via ORAL
  Administered 2012-01-19 – 2012-01-21 (×8): 1000 mg via ORAL
  Filled 2012-01-17 (×18): qty 2

## 2012-01-17 MED ORDER — CHLORHEXIDINE GLUCONATE 4 % EX LIQD: 30.0000 mL | CUTANEOUS | Status: DC

## 2012-01-17 MED ORDER — MIDAZOLAM HCL 5 MG/5ML IJ SOLN
INTRAMUSCULAR | Status: DC | PRN
  Administered 2012-01-17: 3 mg via INTRAVENOUS
  Administered 2012-01-17 (×2): 2 mg via INTRAVENOUS
  Administered 2012-01-17 (×2): 3 mg via INTRAVENOUS
  Administered 2012-01-17 (×2): 2 mg via INTRAVENOUS

## 2012-01-17 MED ORDER — ROCURONIUM BROMIDE 100 MG/10ML IV SOLN
INTRAVENOUS | Status: DC | PRN
  Administered 2012-01-17: 50 mg via INTRAVENOUS

## 2012-01-17 MED ORDER — VECURONIUM BROMIDE 10 MG IV SOLR
INTRAVENOUS | Status: DC | PRN
  Administered 2012-01-17 (×2): 5 mg via INTRAVENOUS
  Administered 2012-01-17: 10 mg via INTRAVENOUS

## 2012-01-17 MED ORDER — SODIUM CHLORIDE 0.9 % IV SOLN: 250.0000 mL | INTRAVENOUS | Status: DC

## 2012-01-17 MED ORDER — METOPROLOL TARTRATE 1 MG/ML IV SOLN: 2.5000 mg | INTRAVENOUS | Status: DC | PRN

## 2012-01-17 MED ORDER — SODIUM CHLORIDE 0.9 % IV SOLN
20.0000 ug | INTRAVENOUS | Status: DC
  Filled 2012-01-17: qty 5

## 2012-01-17 MED ORDER — MORPHINE SULFATE 2 MG/ML IJ SOLN
2.0000 mg | INTRAMUSCULAR | Status: DC | PRN
  Administered 2012-01-18 (×2): 2 mg via INTRAVENOUS
  Filled 2012-01-17 (×2): qty 1

## 2012-01-17 MED ORDER — RISAQUAD PO CAPS
1.0000 | ORAL_CAPSULE | Freq: Every day | ORAL | Status: DC
  Administered 2012-01-19 – 2012-01-21 (×3): 1 via ORAL
  Filled 2012-01-17 (×4): qty 1

## 2012-01-17 MED ORDER — ASPIRIN EC 325 MG PO TBEC
325.0000 mg | DELAYED_RELEASE_TABLET | Freq: Every day | ORAL | Status: DC
  Administered 2012-01-18 – 2012-01-21 (×4): 325 mg via ORAL
  Filled 2012-01-17 (×4): qty 1

## 2012-01-17 MED ORDER — SODIUM CHLORIDE 0.9 % IJ SOLN
OROMUCOSAL | Status: DC | PRN
  Administered 2012-01-17: 09:00:00 via TOPICAL

## 2012-01-17 MED ORDER — LACTATED RINGERS IV SOLN
INTRAVENOUS | Status: DC | PRN
  Administered 2012-01-17 (×5): via INTRAVENOUS

## 2012-01-17 MED ORDER — 0.9 % SODIUM CHLORIDE (POUR BTL) OPTIME
TOPICAL | Status: DC | PRN
  Administered 2012-01-17: 5000 mL

## 2012-01-17 MED ORDER — DOBUTAMINE IN D5W 4-5 MG/ML-% IV SOLN
2.5000 ug/kg/min | INTRAVENOUS | Status: DC
  Filled 2012-01-17: qty 250

## 2012-01-17 MED ORDER — INSULIN ASPART 100 UNIT/ML ~~LOC~~ SOLN
0.0000 [IU] | SUBCUTANEOUS | Status: AC
  Administered 2012-01-17 (×2): 2 [IU] via SUBCUTANEOUS

## 2012-01-17 MED ORDER — FAMOTIDINE IN NACL 20-0.9 MG/50ML-% IV SOLN
20.0000 mg | Freq: Two times a day (BID) | INTRAVENOUS | Status: AC
  Administered 2012-01-17: 20 mg via INTRAVENOUS

## 2012-01-17 MED ORDER — ONDANSETRON HCL 4 MG/2ML IJ SOLN
4.0000 mg | Freq: Four times a day (QID) | INTRAMUSCULAR | Status: DC | PRN
  Administered 2012-01-17: 4 mg via INTRAVENOUS
  Filled 2012-01-17: qty 2

## 2012-01-17 MED ORDER — MIDAZOLAM HCL 2 MG/2ML IJ SOLN: 2.0000 mg | INTRAMUSCULAR | Status: DC | PRN

## 2012-01-17 MED ORDER — EPHEDRINE SULFATE 50 MG/ML IJ SOLN
INTRAMUSCULAR | Status: DC | PRN
  Administered 2012-01-17 (×4): 5 mg via INTRAVENOUS

## 2012-01-17 MED ORDER — PANTOPRAZOLE SODIUM 40 MG PO TBEC
40.0000 mg | DELAYED_RELEASE_TABLET | Freq: Every day | ORAL | Status: DC
  Administered 2012-01-19 – 2012-01-21 (×3): 40 mg via ORAL
  Filled 2012-01-17 (×2): qty 1

## 2012-01-17 MED ORDER — SODIUM CHLORIDE 0.9 % IR SOLN
Status: DC | PRN
  Administered 2012-01-17: 09:00:00

## 2012-01-17 MED ORDER — POTASSIUM CHLORIDE 10 MEQ/50ML IV SOLN
10.0000 meq | INTRAVENOUS | Status: AC
  Administered 2012-01-17 (×3): 10 meq via INTRAVENOUS

## 2012-01-17 MED ORDER — ASPIRIN 81 MG PO CHEW: 324.0000 mg | CHEWABLE_TABLET | Freq: Every day | ORAL | Status: DC

## 2012-01-17 MED ORDER — SODIUM CHLORIDE 0.9 % IJ SOLN: 3.0000 mL | INTRAMUSCULAR | Status: DC | PRN

## 2012-01-17 MED ORDER — BISACODYL 10 MG RE SUPP: 10.0000 mg | Freq: Every day | RECTAL | Status: DC

## 2012-01-17 MED ORDER — ADULT MULTIVITAMIN W/MINERALS CH
1.0000 | ORAL_TABLET | Freq: Every day | ORAL | Status: DC
  Administered 2012-01-18 – 2012-01-21 (×4): 1 via ORAL
  Filled 2012-01-17 (×4): qty 1

## 2012-01-17 MED ORDER — DOCUSATE SODIUM 100 MG PO CAPS
200.0000 mg | ORAL_CAPSULE | Freq: Every day | ORAL | Status: DC
  Administered 2012-01-18 – 2012-01-21 (×4): 200 mg via ORAL
  Filled 2012-01-17 (×4): qty 2

## 2012-01-17 MED ORDER — INSULIN REGULAR BOLUS VIA INFUSION
0.0000 [IU] | Freq: Three times a day (TID) | INTRAVENOUS | Status: DC
  Filled 2012-01-17: qty 10

## 2012-01-17 MED ORDER — ALBUMIN HUMAN 5 % IV SOLN
INTRAVENOUS | Status: DC | PRN
  Administered 2012-01-17 (×2): via INTRAVENOUS

## 2012-01-17 MED ORDER — ACETAMINOPHEN 650 MG RE SUPP
650.0000 mg | RECTAL | Status: AC
  Administered 2012-01-17: 650 mg via RECTAL

## 2012-01-17 MED ORDER — SODIUM CHLORIDE 0.45 % IV SOLN: INTRAVENOUS | Status: DC

## 2012-01-17 MED ORDER — HEMOSTATIC AGENTS (NO CHARGE) OPTIME
TOPICAL | Status: DC | PRN
  Administered 2012-01-17: 1 via TOPICAL

## 2012-01-17 MED ORDER — DEXTROSE 5 % IV SOLN
1.5000 g | Freq: Two times a day (BID) | INTRAVENOUS | Status: DC
  Administered 2012-01-17 – 2012-01-18 (×3): 1.5 g via INTRAVENOUS
  Filled 2012-01-17 (×4): qty 1.5

## 2012-01-17 MED ORDER — 0.9 % SODIUM CHLORIDE (POUR BTL) OPTIME
TOPICAL | Status: DC | PRN
  Administered 2012-01-17: 1000 mL

## 2012-01-17 MED ORDER — 0.9 % SODIUM CHLORIDE (POUR BTL) OPTIME
TOPICAL | Status: DC | PRN
  Administered 2012-01-17: 2000 mL

## 2012-01-17 MED ORDER — DOBUTAMINE IN D5W 4-5 MG/ML-% IV SOLN
INTRAVENOUS | Status: DC | PRN
  Administered 2012-01-17: 3 ug/kg/min via INTRAVENOUS

## 2012-01-17 MED ORDER — HEMOSTATIC AGENTS (NO CHARGE) OPTIME
TOPICAL | Status: DC | PRN
  Administered 2012-01-17: 2 via TOPICAL

## 2012-01-17 MED ORDER — FENTANYL CITRATE 0.05 MG/ML IJ SOLN
INTRAMUSCULAR | Status: DC | PRN
  Administered 2012-01-17: 700 ug via INTRAVENOUS
  Administered 2012-01-17: 250 ug via INTRAVENOUS
  Administered 2012-01-17: 50 ug via INTRAVENOUS
  Administered 2012-01-17 (×2): 250 ug via INTRAVENOUS

## 2012-01-17 MED ORDER — PHENYLEPHRINE HCL 10 MG/ML IJ SOLN
INTRAMUSCULAR | Status: DC | PRN
  Administered 2012-01-17 (×4): 40 ug via INTRAVENOUS

## 2012-01-17 MED ORDER — ACETAMINOPHEN 160 MG/5ML PO SOLN: 650.0000 mg | ORAL | Status: AC

## 2012-01-17 MED ORDER — ACIDOPHILUS PO TABS: 1.0000 | ORAL_TABLET | ORAL | Status: DC

## 2012-01-17 MED ORDER — VANCOMYCIN HCL IN DEXTROSE 1-5 GM/200ML-% IV SOLN
1000.0000 mg | Freq: Once | INTRAVENOUS | Status: AC
  Administered 2012-01-17: 1000 mg via INTRAVENOUS
  Filled 2012-01-17: qty 200

## 2012-01-17 SURGICAL SUPPLY — 92 items
ADAPTER CARDIO PERF ANTE/RETRO (ADAPTER) ×3 IMPLANT
ADH SRG 12 PREFL SYR 3 SPRDR (MISCELLANEOUS)
ADPR PRFSN 84XANTGRD RTRGD (ADAPTER) ×1
ATTRACTOMAT 16X20 MAGNETIC DRP (DRAPES) ×3 IMPLANT
BAG DECANTER FOR FLEXI CONT (MISCELLANEOUS) ×3 IMPLANT
BLADE STERNUM SYSTEM 6 (BLADE) ×3 IMPLANT
BLADE SURG 12 STRL SS (BLADE) ×3 IMPLANT
BLADE SURG 15 STRL LF DISP TIS (BLADE) ×1 IMPLANT
BLADE SURG 15 STRL SS (BLADE) ×6
BLADE SURG ROTATE 9660 (MISCELLANEOUS) ×2 IMPLANT
CANISTER SUCTION 2500CC (MISCELLANEOUS) ×3 IMPLANT
CANNULA GUNDRY RCSP 15FR (MISCELLANEOUS) ×3 IMPLANT
CATH CPB KIT VANTRIGT (MISCELLANEOUS) ×3 IMPLANT
CATH HEART VENT LEFT (CATHETERS) ×1 IMPLANT
CATH RETROPLEGIA CORONARY 14FR (CATHETERS) IMPLANT
CATH ROBINSON RED A/P 18FR (CATHETERS) ×11 IMPLANT
CATH THORACIC 36FR RT ANG (CATHETERS) ×3 IMPLANT
CLIP FOGARTY SPRING 6M (CLIP) IMPLANT
CLOTH BEACON ORANGE TIMEOUT ST (SAFETY) ×3 IMPLANT
CONT SPEC 4OZ CLIKSEAL STRL BL (MISCELLANEOUS) ×2 IMPLANT
COVER SURGICAL LIGHT HANDLE (MISCELLANEOUS) ×6 IMPLANT
CRADLE DONUT ADULT HEAD (MISCELLANEOUS) ×3 IMPLANT
DRAIN CHANNEL 32F RND 10.7 FF (WOUND CARE) ×3 IMPLANT
DRAPE CARDIOVASCULAR INCISE (DRAPES) ×3
DRAPE SLUSH MACHINE 52X66 (DRAPES) IMPLANT
DRAPE SLUSH/WARMER DISC (DRAPES) ×2 IMPLANT
DRAPE SRG 135X102X78XABS (DRAPES) ×1 IMPLANT
DRSG COVADERM 4X14 (GAUZE/BANDAGES/DRESSINGS) ×3 IMPLANT
ELECT BLADE 4.0 EZ CLEAN MEGAD (MISCELLANEOUS) ×3
ELECT BLADE 6.5 EXT (BLADE) ×1 IMPLANT
ELECT CAUTERY BLADE 6.4 (BLADE) ×3 IMPLANT
ELECT REM PT RETURN 9FT ADLT (ELECTROSURGICAL) ×6
ELECTRODE BLDE 4.0 EZ CLN MEGD (MISCELLANEOUS) ×1 IMPLANT
ELECTRODE REM PT RTRN 9FT ADLT (ELECTROSURGICAL) ×2 IMPLANT
GLOVE BIO SURGEON STRL SZ 6 (GLOVE) ×2 IMPLANT
GLOVE BIO SURGEON STRL SZ 6.5 (GLOVE) ×1 IMPLANT
GLOVE BIO SURGEON STRL SZ7.5 (GLOVE) ×10 IMPLANT
GLOVE BIO SURGEONS STRL SZ 6.5 (GLOVE) ×1
GLOVE BIOGEL M STER SZ 6 (GLOVE) ×4 IMPLANT
GLOVE BIOGEL PI IND STRL 6.5 (GLOVE) IMPLANT
GLOVE BIOGEL PI IND STRL 7.0 (GLOVE) IMPLANT
GLOVE BIOGEL PI INDICATOR 6.5 (GLOVE) ×2
GLOVE BIOGEL PI INDICATOR 7.0 (GLOVE) ×6
GOWN STRL NON-REIN LRG LVL3 (GOWN DISPOSABLE) ×18 IMPLANT
HEMOSTAT POWDER SURGIFOAM 1G (HEMOSTASIS) ×9 IMPLANT
HEMOSTAT SURGICEL 2X14 (HEMOSTASIS) ×1 IMPLANT
INSERT FOGARTY XLG (MISCELLANEOUS) IMPLANT
KIT BASIN OR (CUSTOM PROCEDURE TRAY) ×3 IMPLANT
KIT ROOM TURNOVER OR (KITS) ×3 IMPLANT
KIT SUCTION CATH 14FR (SUCTIONS) ×3 IMPLANT
LEAD PACING MYOCARDI (MISCELLANEOUS) ×3 IMPLANT
LINE VENT (MISCELLANEOUS) ×2 IMPLANT
NS IRRIG 1000ML POUR BTL (IV SOLUTION) ×24 IMPLANT
PACK OPEN HEART (CUSTOM PROCEDURE TRAY) ×3 IMPLANT
PAD ARMBOARD 7.5X6 YLW CONV (MISCELLANEOUS) ×6 IMPLANT
SET CARDIOPLEGIA MPS 5001102 (MISCELLANEOUS) ×2 IMPLANT
SPONGE GAUZE 4X4 12PLY (GAUZE/BANDAGES/DRESSINGS) ×4 IMPLANT
SPONGE LAP 18X18 X RAY DECT (DISPOSABLE) ×2 IMPLANT
SURGIFLO W/THROMBIN 8M KIT (HEMOSTASIS) IMPLANT
SUT BONE WAX W31G (SUTURE) ×3 IMPLANT
SUT ETHIBON 2 0 V 52N 30 (SUTURE) ×5 IMPLANT
SUT ETHIBOND 2 0 SH (SUTURE) ×12
SUT ETHIBOND 2 0 SH 36X2 (SUTURE) ×1 IMPLANT
SUT PROLENE 3 0 RB 1 (SUTURE) ×3 IMPLANT
SUT PROLENE 3 0 SH 1 (SUTURE) IMPLANT
SUT PROLENE 3 0 SH DA (SUTURE) IMPLANT
SUT PROLENE 4 0 RB 1 (SUTURE) ×12
SUT PROLENE 4 0 SH DA (SUTURE) ×5 IMPLANT
SUT PROLENE 4-0 RB1 .5 CRCL 36 (SUTURE) ×1 IMPLANT
SUT PROLENE 5 0 C 1 36 (SUTURE) ×4 IMPLANT
SUT PROLENE 6 0 C 1 30 (SUTURE) ×6 IMPLANT
SUT PROLENE 8 0 BV175 6 (SUTURE) ×4 IMPLANT
SUT SILK  1 MH (SUTURE) ×6
SUT SILK 1 MH (SUTURE) IMPLANT
SUT SILK 3 0 SH CR/8 (SUTURE) ×4 IMPLANT
SUT STEEL 6MS V (SUTURE) ×6 IMPLANT
SUT STEEL STERNAL CCS#1 18IN (SUTURE) ×2 IMPLANT
SUT STEEL SZ 6 DBL 3X14 BALL (SUTURE) ×5 IMPLANT
SUT VIC AB 1 CTX 36 (SUTURE) ×6
SUT VIC AB 1 CTX36XBRD ANBCTR (SUTURE) ×2 IMPLANT
SUT VIC AB 2-0 CTX 27 (SUTURE) ×4 IMPLANT
SUT VIC AB 3-0 X1 27 (SUTURE) ×4 IMPLANT
SYR 10ML KIT SKIN ADHESIVE (MISCELLANEOUS) IMPLANT
SYSTEM SAHARA CHEST DRAIN ATS (WOUND CARE) ×3 IMPLANT
TAPE CLOTH SURG 4X10 WHT LF (GAUZE/BANDAGES/DRESSINGS) ×2 IMPLANT
TOWEL OR 17X24 6PK STRL BLUE (TOWEL DISPOSABLE) ×6 IMPLANT
TOWEL OR 17X26 10 PK STRL BLUE (TOWEL DISPOSABLE) ×6 IMPLANT
TRAY FOLEY IC TEMP SENS 14FR (CATHETERS) ×3 IMPLANT
UNDERPAD 30X30 INCONTINENT (UNDERPADS AND DIAPERS) ×3 IMPLANT
VALVE AORTIC MAGNA 21MM (Prosthesis & Implant Heart) ×2 IMPLANT
VENT LEFT HEART 12002 (CATHETERS) ×3
WATER STERILE IRR 1000ML POUR (IV SOLUTION) ×6 IMPLANT

## 2012-01-17 NOTE — Progress Notes (Signed)
Dr Jean Rosenthal stated they would check na in or,not to restick in short stay.

## 2012-01-17 NOTE — Progress Notes (Signed)
The patient was examined and preop studies reviewed. There has been no change from the prior exam and the patient is ready for surgery.  Plan AVR for AS

## 2012-01-17 NOTE — Anesthesia Postprocedure Evaluation (Signed)
  Anesthesia Post-op Note  Patient: Andrew Bonilla  Procedure(s) Performed: Procedure(s) (LRB): AORTIC VALVE REPLACEMENT (AVR) (N/A)  Patient Location: ICU  Anesthesia Type: General  Level of Consciousness: unresponsive  Airway and Oxygen Therapy: Patient remains intubated per anesthesia plan  Post-op Pain: none  Post-op Assessment: Post-op Vital signs reviewed, Patient's Cardiovascular Status Stable and Respiratory Function Stable  Post-op Vital Signs: Reviewed and stable  Complications: No apparent anesthesia complications

## 2012-01-17 NOTE — Procedures (Signed)
Extubation Procedure Note  Patient Details:   Name: Andrew Bonilla DOB: Jun 10, 1933 MRN: [id number]   Airway Documentation:  Airway 8 mm (Active)  Secured at (cm) 23 cm 01/17/2012  1:00 PM  Measured From Lips 01/17/2012  4:50 PM  Secured Location Right 01/17/2012  4:50 PM  Secured By Pink Tape 01/17/2012  4:50 PM  Site Condition Dry 01/17/2012  4:50 PM    Evaluation  O2 sats: stable throughout Complications: No apparent complications Patient did tolerate procedure well. Bilateral Breath Sounds: Clear  Pt extub @ 1725 with no complications.  Bilateral bs heard and 4lpm C[place] placed on pt. Yes  Doniesha Landau 01/17/2012, 5:41 PM

## 2012-01-17 NOTE — Progress Notes (Signed)
Status post aortic valve replacement for aortic stenosis Stable hemodynamics Patient extubated neuro intact Labs stable hematocrit 25% Chest tube drainage acceptable

## 2012-01-17 NOTE — Progress Notes (Signed)
  Echocardiogram Echocardiogram Transesophageal has been performed.  Andrew Bonilla 01/17/2012, 12:25 PM

## 2012-01-17 NOTE — Anesthesia Preprocedure Evaluation (Addendum)
Anesthesia Evaluation  Patient identified by MRN, date of birth, ID band Patient awake    Reviewed: Allergy & Precautions, H&P , NPO status , Patient's Chart, lab work & pertinent test results  Airway Mallampati: I TM Distance: >3 FB Neck ROM: Full    Dental No notable dental hx. (+) Teeth Intact and Dental Advisory Given   Pulmonary Recent URI ,  breath sounds clear to auscultation  Pulmonary exam normal       Cardiovascular hypertension, On Medications + Valvular Problems/Murmurs AS Rhythm:Regular Rate:Normal + Systolic murmurs    Neuro/Psych negative neurological ROS  negative psych ROS   GI/Hepatic Neg liver ROS, GERD-  ,  Endo/Other  negative endocrine ROS  Renal/GU negative Renal ROS  negative genitourinary   Musculoskeletal   Abdominal   Peds  Hematology negative hematology ROS (+)   Anesthesia Other Findings   Reproductive/Obstetrics negative OB ROS                          Anesthesia Physical Anesthesia Plan  ASA: IV  Anesthesia Plan: General   Post-op Pain Management:    Induction: Intravenous  Airway Management Planned: Oral ETT  Additional Equipment: Arterial line, CVP, PA Cath, Ultrasound Guidance Line Placement and TEE  Intra-op Plan:   Post-operative Plan: Post-operative intubation/ventilation  Informed Consent: I have reviewed the patients History and Physical, chart, labs and discussed the procedure including the risks, benefits and alternatives for the proposed anesthesia with the patient or authorized representative who has indicated his/her understanding and acceptance.   Dental advisory given  Plan Discussed with: CRNA  Anesthesia Plan Comments:         Anesthesia Quick Evaluation

## 2012-01-17 NOTE — Brief Op Note (Signed)
01/17/2012  11:04 AM  PATIENT:  Andrew Bonilla  78 y.o. male  PRE-OPERATIVE DIAGNOSIS:  Severe Aortic stenosis  POST-OPERATIVE DIAGNOSIS:  Severe Aortic stenosis  PROCEDURE:AORTIC VALVE REPLACEMENT (AVR) using a Magna Ease Pericardial tissue valve (size 21 mm)  SURGEON:  Surgeon(s) and Role:    * Kerin Perna, MD - Primary  PHYSICIAN ASSISTANT: Doree Fudge PA-C  ASSISTANTS: Sherrie George RNFA  ANESTHESIA:   general  EBL:  Total I/O In: 2000 [I.V.:2000] Out: 400 [Urine:400]   DRAINS:  Chest Tube(s) in the in the mediastinal and pleural spaces    SPECIMEN:  Source of Specimen:  Native aortic valve leaflets  DISPOSITION OF SPECIMEN:  PATHOLOGY  COUNTS:  YES  DICTATION: .Dragon Dictation  PLAN OF CARE: Admit to inpatient   PATIENT DISPOSITION:  ICU - intubated and hemodynamically stable.   Delay start of Pharmacological VTE agent (>24hrs) due to surgical blood loss or risk of bleeding: yes  PRE OP WEIGHT: 108 kg

## 2012-01-17 NOTE — Transfer of Care (Signed)
Immediate Anesthesia Transfer of Care Note  Patient: Andrew Bonilla  Procedure(s) Performed: Procedure(s) (LRB): AORTIC VALVE REPLACEMENT (AVR) (N/A)  Patient Location: PACU and SICU  Anesthesia Type: General  Level of Consciousness: sedated  Airway & Oxygen Therapy: Patient remains intubated per anesthesia plan and Patient placed on Ventilator (see vital sign flow sheet for setting)  Post-op Assessment: Report given to PACU RN and Post -op Vital signs reviewed and stable  Post vital signs: Reviewed and stable  Complications: No apparent anesthesia complications

## 2012-01-18 ENCOUNTER — Inpatient Hospital Stay (HOSPITAL_COMMUNITY): Payer: Medicare Other

## 2012-01-18 ENCOUNTER — Encounter (HOSPITAL_COMMUNITY): Payer: Self-pay | Admitting: *Deleted

## 2012-01-18 DIAGNOSIS — I10 Essential (primary) hypertension: Secondary | ICD-10-CM | POA: Diagnosis not present

## 2012-01-18 DIAGNOSIS — Z09 Encounter for follow-up examination after completed treatment for conditions other than malignant neoplasm: Secondary | ICD-10-CM | POA: Diagnosis not present

## 2012-01-18 DIAGNOSIS — D62 Acute posthemorrhagic anemia: Secondary | ICD-10-CM | POA: Diagnosis not present

## 2012-01-18 DIAGNOSIS — I359 Nonrheumatic aortic valve disorder, unspecified: Secondary | ICD-10-CM | POA: Diagnosis not present

## 2012-01-18 DIAGNOSIS — E785 Hyperlipidemia, unspecified: Secondary | ICD-10-CM | POA: Diagnosis not present

## 2012-01-18 DIAGNOSIS — R141 Gas pain: Secondary | ICD-10-CM | POA: Diagnosis not present

## 2012-01-18 DIAGNOSIS — J9819 Other pulmonary collapse: Secondary | ICD-10-CM | POA: Diagnosis not present

## 2012-01-18 LAB — POCT I-STAT, CHEM 8
BUN: 16 mg/dL (ref 6–23)
Calcium, Ion: 1.23 mmol/L (ref 1.12–1.32)
Chloride: 99 mEq/L (ref 96–112)
Creatinine, Ser: 1 mg/dL (ref 0.50–1.35)
Glucose, Bld: 120 mg/dL — ABNORMAL HIGH (ref 70–99)
HCT: 29 % — ABNORMAL LOW (ref 39.0–52.0)
Hemoglobin: 9.9 g/dL — ABNORMAL LOW (ref 13.0–17.0)
Potassium: 4 mEq/L (ref 3.5–5.1)
Sodium: 136 mEq/L (ref 135–145)
TCO2: 24 mmol/L (ref 0–100)

## 2012-01-18 LAB — GLUCOSE, CAPILLARY
Glucose-Capillary: 130 mg/dL — ABNORMAL HIGH (ref 70–99)
Glucose-Capillary: 145 mg/dL — ABNORMAL HIGH (ref 70–99)
Glucose-Capillary: 147 mg/dL — ABNORMAL HIGH (ref 70–99)
Glucose-Capillary: 153 mg/dL — ABNORMAL HIGH (ref 70–99)
Glucose-Capillary: 155 mg/dL — ABNORMAL HIGH (ref 70–99)
Glucose-Capillary: 157 mg/dL — ABNORMAL HIGH (ref 70–99)

## 2012-01-18 LAB — BASIC METABOLIC PANEL
BUN: 12 mg/dL (ref 6–23)
Creatinine, Ser: 0.79 mg/dL (ref 0.50–1.35)
GFR calc Af Amer: >90 mL/min (ref >90)
GFR calc non Af Amer: 84 mL/min — ABNORMAL LOW (ref >90)
Potassium: 4.3 mEq/L (ref 3.5–5.1)

## 2012-01-18 LAB — CBC
HCT: 28.9 % — ABNORMAL LOW (ref 39.0–52.0)
MCH: 31.3 pg (ref 26.0–34.0)
MCHC: 35.3 g/dL (ref 30.0–36.0)
MCHC: 35.6 g/dL (ref 30.0–36.0)
MCV: 87.8 fL (ref 78.0–100.0)
Platelets: 144 10*3/uL — ABNORMAL LOW (ref 150–400)
Platelets: 132 10*3/uL — ABNORMAL LOW (ref 150–400)
RBC: 3.04 MIL/uL — ABNORMAL LOW (ref 4.22–5.81)
RDW: 13.5 % (ref 11.5–15.5)
RDW: 13.9 % (ref 11.5–15.5)
WBC: 14.8 10*3/uL — ABNORMAL HIGH (ref 4.0–10.5)

## 2012-01-18 LAB — MAGNESIUM: Magnesium: 2.3 mg/dL (ref 1.5–2.5)

## 2012-01-18 MED ORDER — FUROSEMIDE 10 MG/ML IJ SOLN
20.0000 mg | Freq: Once | INTRAMUSCULAR | Status: AC
  Administered 2012-01-18: 20 mg via INTRAVENOUS
  Filled 2012-01-18: qty 2

## 2012-01-18 MED ORDER — FUROSEMIDE 10 MG/ML IJ SOLN
20.0000 mg | Freq: Once | INTRAMUSCULAR | Status: AC
  Administered 2012-01-18: 09:00:00 via INTRAVENOUS
  Filled 2012-01-18: qty 2

## 2012-01-18 MED ORDER — METOCLOPRAMIDE HCL 5 MG/ML IJ SOLN
10.0000 mg | Freq: Four times a day (QID) | INTRAMUSCULAR | Status: DC
  Administered 2012-01-18 – 2012-01-19 (×3): 10 mg via INTRAVENOUS
  Filled 2012-01-18 (×5): qty 2

## 2012-01-18 MED ORDER — INSULIN GLARGINE 100 UNIT/ML ~~LOC~~ SOLN
10.0000 [IU] | Freq: Every day | SUBCUTANEOUS | Status: DC
  Administered 2012-01-18: 10 [IU] via SUBCUTANEOUS

## 2012-01-18 MED ORDER — INSULIN ASPART 100 UNIT/ML ~~LOC~~ SOLN
0.0000 [IU] | Freq: Three times a day (TID) | SUBCUTANEOUS | Status: DC
  Administered 2012-01-18 – 2012-01-19 (×2): 2 [IU] via SUBCUTANEOUS

## 2012-01-18 MED ORDER — TRAMADOL HCL 50 MG PO TABS
50.0000 mg | ORAL_TABLET | Freq: Four times a day (QID) | ORAL | Status: DC | PRN
  Administered 2012-01-18 – 2012-01-19 (×4): 50 mg via ORAL
  Filled 2012-01-18 (×4): qty 1

## 2012-01-18 MED ORDER — MORPHINE SULFATE 10 MG/5ML PO SOLN
2.5000 mg | ORAL | Status: DC | PRN
Start: 2012-01-18 — End: 2012-01-18

## 2012-01-18 MED ORDER — MORPHINE BOLUS VIA INFUSION: 2.0000 mg | Freq: Two times a day (BID) | INTRAVENOUS | Status: DC | PRN

## 2012-01-18 MED ORDER — MORPHINE SULFATE 2 MG/ML IJ SOLN: 2.0000 mg | Freq: Two times a day (BID) | INTRAMUSCULAR | Status: DC | PRN

## 2012-01-18 MED ORDER — INSULIN ASPART 100 UNIT/ML ~~LOC~~ SOLN
0.0000 [IU] | SUBCUTANEOUS | Status: DC
  Administered 2012-01-18 (×4): 2 [IU] via SUBCUTANEOUS

## 2012-01-18 NOTE — Progress Notes (Signed)
Utilization Review Completed.Eliska Hamil T5/17/2013   

## 2012-01-18 NOTE — Progress Notes (Signed)
1 Day Post-Op Procedure(s) (LRB): AORTIC VALVE REPLACEMENT (AVR) (N/A)                      [address]            [place] [address]          [phone]      SubjectiveAVR 21mm Magna bovine NSR, neuro intact, no murmur Gastric dilatation on CXR, go slow w/ PO intake,short course of reglan Objective: Vital signs in last 24 hours: Temp:  [95 F (35 C)-99 F (37.2 C)] 98.8 F (37.1 C) (05/17 0700) Pulse Rate:  [72-88] 88  (05/17 0700) Cardiac Rhythm:  [-] Atrial paced (05/17 0400) Resp:  [5-25] 22  (05/17 0700) BP: (130)/(65) 130/65 mmHg (05/16 1300) SpO2:  [96 %-100 %] 97 % (05/17 0700) Arterial Line BP: (70-150)/(44-74) 115/52 mmHg (05/17 0700) FiO2 (%):  [41.2 %-51.8 %] 41.2 % (05/16 1715) Weight:  [243 lb 6.2 oz (110.4 kg)] 243 lb 6.2 oz (110.4 kg) (05/17 0410)  Hemodynamic parameters for last 24 hours: PAP: (27-44)/(9-21) 32/17 mmHg CO:  [4 L/min-5.5 L/min] 5.5 L/min CI:  [1.9 L/min/m2-2.6 L/min/m2] 2.6 L/min/m2  Intake/Output from previous day: 05/16 0701 - 05/17 0700 In: 7441.3 [P.O.:50; I.V.:5092.3; Blood:747; IV Piggyback:1552] Out: 4950 [Urine:3070; Blood:1400; Chest Tube:480] Intake/Output this shift:    Lungs clear,abd soft, extrem warm  Lab Results:  Basename 01/18/12 0415 01/17/12 1822 01/17/12 1819  WBC 14.8* -- 13.2*  HGB 10.2* 10.2* --  HCT 28.9* 30.0* --  PLT 144* -- 137*   BMET:  Basename 01/18/12 0415 01/17/12 1822  NA 132* 135  K 4.3 4.5  CL 99 105  CO2 21 --  GLUCOSE 166* 140*  BUN 12 11  CREATININE 0.79 0.70  CALCIUM 8.3* --    PT/INR:  Basename 01/17/12 1300  LABPROT 21.3*  INR 1.81*   ABG    Component Value Date/Time   PHART 7.324* 01/17/2012 1819   HCO3 22.9 01/17/2012 1819   TCO2 23 01/17/2012 1822   ACIDBASEDEF 3.0* 01/17/2012 1819   O2SAT 99.0 01/17/2012 1819   CBG (last 3)   Basename 01/18/12 0731 01/18/12 0339 01/17/12 2344  GLUCAP 145* 153* 157*    Assessment/Plan: S/P Procedure(s)  (LRB): AORTIC VALVE REPLACEMENT (AVR) (N/A) See progression orders   LOS: 1 day    Andrew Bonilla,Andrew Bonilla 01/18/2012

## 2012-01-18 NOTE — Progress Notes (Signed)
[facility] BRIEF SICU PROGRESS NOTE  1 Day Post-Op  S/P Procedure(s) (LRB): AORTIC VALVE REPLACEMENT (AVR) (N/A)   Stable day Sinus rhythm, BP stable O2 sats 95 % on 2 L/min UOP adequate  Plan: Continue current plan  Katrine Radich H 01/18/2012 7:46 PM

## 2012-01-18 NOTE — Clinical Documentation Improvement (Signed)
GENERIC DOCUMENTATION CLARIFICATION QUERY  THIS DOCUMENT IS NOT A PERMANENT PART OF THE MEDICAL RECORD  TO RESPOND TO THE THIS QUERY, FOLLOW THE INSTRUCTIONS BELOW:  1. If needed, update documentation for the patient's encounter via the notes activity.  2. Access this query again and click edit on the In [employer].  3. After updating, or not, click F2 to complete all highlighted (required) fields concerning your review. Select "additional documentation in the medical record" OR "no additional documentation provided".  4. Click Sign note button.  5. The deficiency will fall out of your In Basket *Please let us know if you are not able to complete this workflow by phone or e-mail (listed below).  Please update your documentation within the medical record to reflect your response to this query.                                                                                        01/18/12   Dear Dr.VanTrigt / Associates,  In a better effort to capture your patient's severity of illness, reflect appropriate length of stay and utilization of resources, a review of the patient medical record has revealed the following indicators.    Based on your clinical judgment, please clarify and document in a progress note and/or discharge summary the clinical condition associated with the following supporting information:  In responding to this query please exercise your independent judgment.  The fact that a query is asked, does not imply that any particular answer is desired or expected.  Possible Clinical Conditions?  __ Hyponatremia  __ Other Condition  __ Cannot Clinically Determine    Diagnostics: 5/16:  Sodium: 128  Treatment: 5/16:   0.45% NaCl at 65ml/hr 5/16:   0.9% NaCl at 32ml/hr  You may use possible, probable, or suspect with inpatient documentation. possible, probable, suspected diagnoses MUST be documented at the time of discharge  Reviewed: No additional documentation  provided.  Thank You,  Marciano Sequin,  Clinical Documentation Specialist:  Pager: [phone]  Health Information Management [place]

## 2012-01-18 NOTE — Op Note (Signed)
Andrew Bonilla, Andrew Bonilla NO.:  [id number]  MEDICAL RECORD NO.:  [id number]  LOCATION:  2316                         FACILITY:  [facility]  PHYSICIAN:  Kerin Perna, M.D.  DATE OF BIRTH:  05-Aug-1933  DATE OF PROCEDURE: DATE OF DISCHARGE:                              OPERATIVE REPORT   OPERATION:  Aortic valve replacement with a 21-mm pericardial tissue valve (Edwards model 3300TFX, serial [id number]).  PREOPERATIVE DIAGNOSIS:  Moderate-to-severe aortic stenosis with syncope.  POSTOPERATIVE DIAGNOSIS:  Moderate-to-severe aortic stenosis with syncope.  SURGEON:  Kerin Perna, MD  ASSISTANT:  Doree Fudge, PA-C  ANESTHESIA:  General by Dr. Autumn Patty.  INDICATIONS:  The patient is a 78 year old gentleman with a long history of cardiac murmur and recent episodes of syncope twice.  A followup 2D echo showed an increased transvalvular gradient and a valve area of 0.9 consistent with moderate-to-severe aortic stenosis.  Cardiac catheterization performed, demonstrated no significant coronary artery disease with preserved LV systolic function, and a calculated valve area of 1.1.  It was felt with the patient's calcified valve and moderate-to- severe aortic stenosis with associated syncope that aortic valve replacement would be the best option and that a bioprosthetic valve would be the best choice for valve.  I examined the patient in the office and reviewed his preoperative cardiac cath and 2D echo and discussed those results with the patient and family.  I discussed the indications and expected benefits of aortic valve replacement for treatment of his aortic stenosis as well as the alternative therapies. I reviewed with the patient the major aspects of the operation including the use of general anesthesia and cardiopulmonary bypass, the location of the surgical incisions, the expected postoperative hospital recovery, and the potential perioperative  risks of stroke, bleeding, blood transfusion requirement, arrhythmia, pacemaker requirement, infection, and death.  After reviewing these issues, he demonstrated his understanding and agreed to proceed with surgery under what I felt was an informed consent.  PROCEDURE:  The patient was brought to the operating room and placed supine on the operating table, where general anesthesia was induced.  A transesophageal 2D echo probe was placed by the anesthesiologist, which confirmed calcified aortic valve with aortic stenosis and the calculated area of 1.1.  The LV outflow tract measured approximately 20 mm.  There is no significant MR.  The patient was prepped and draped as a sterile field, and a proper time-out was performed.  A sternal incision was made.  The sternum was retracted, and the pericardium was opened. Heparin was administered.  Pursestrings were placed in the ascending aorta and right atrium.  The aorta was inspected and palpated, and there was a strong thrill through the ascending aorta from the aortic stenosis.  After the ACT was documented as being therapeutic, the patient was cannulated and placed on cardiopulmonary bypass.  A left ventricular vent was placed via the right superior pulmonary vein. Cardioplegia catheters were placed for both antegrade and retrograde cold blood cardioplegia, and the patient was cooled to 32 degrees.  The aortic cross-clamp was applied and cardioplegia was delivered.  There was a good cardioplegic arrest.  Septal temperature dropped less than 15 degrees.  Cardioplegia was delivered every 20 minutes.  A transverse aortotomy was made.  There was some significant calcium in the aortic root just below the sino-tubular junction and this was avoided.  The aortic valve was inspected.  CO2 was used to insufflate the surgical field.  The leaflets were calcified, thickened, and stenotic.  The leaflets were excised, and the anulus was debrided  of calcium.  The outflow tract was irrigated with copious amounts of saline.  The anulus was sized to a 21-mm Architect.  Subannular 2-0 Ethibond pledgeted sutures were placed around the anulus numbering 14 total.  The valve was prepared according to protocol.  The sutures were placed through the sewing ring and the valve was lowered into the anulus and the sutures tied.  There was good apposition of the valve to the anulus without space for perivalvular leak.  The coronary ostium were widely patent.  The aortotomy was closed with running 4-0 Prolene in 2 layers, and air was vented from the coronaries with a dose of retrograde warm blood cardioplegia.  The crossclamp was removed.  The heart resumed a spontaneous rhythm.  The aortotomy was hemostatic.  The patient was rewarmed and reperfused.  Temporary pacing wires were applied, and the lungs re-expanded and the ventilator was resumed.  The cardioplegia lines and LV vent were removed.  The patient was then weaned off bypass without difficulty.  Cardiac output and blood pressure were stable. Protamine was administered without adverse reaction.  After the protamine was administered, there was still diffuse coagulopathy.  We spent a considerable time drying the operative field and also gave 1 extra dose of protamine.  This improved coagulation function.  The superior pericardial fat was closed over the aorta.  An anterior mediastinal chest tube was placed and brought out through a separate incision.  The sternum was closed with interrupted steel wire.  The pectoralis fascia was closed with a running #1 Vicryl.  The subcutaneous and skin layers were closed in running Vicryl.  The post-pump transesophageal echo showed good function of the prosthetic valve without aortic insufficiency.  LV global function was preserved.  The patient returned to the ICU in stable condition.  Blood products were not administered for this operation.  Total  cardiopulmonary bypass time was 98 minutes.     Kerin Perna, M.D.     PV/MEDQ  D:  01/17/2012  T:  01/18/2012  Job:  [id number]  cc:   Italy Hilty, MD

## 2012-01-18 NOTE — Clinical Documentation Improvement (Signed)
Anemia Blood Loss Clarification  THIS DOCUMENT IS NOT A PERMANENT PART OF THE MEDICAL RECORD  RESPOND TO THE THIS QUERY, FOLLOW THE INSTRUCTIONS BELOW:  1. If needed, update documentation for the patient's encounter via the notes activity.  2. Access this query again and click edit on the In [employer].  3. After updating, or not, click F2 to complete all highlighted (required) fields concerning your review. Select "additional documentation in the medical record" OR "no additional documentation provided".  4. Click Sign note button.  5. The deficiency will fall out of your In Basket *Please let us know if you are not able to complete this workflow by phone or e-mail (listed below).        01/18/12  Dear Dr.Van Maudie Flakes Marton Redwood  In an effort to better capture your patient's severity of illness, reflect appropriate length of stay and utilization of resources, a review of the patient medical record has revealed the following indicators.    Based on your clinical judgment, please clarify and document in a progress note and/or discharge summary the clinical condition associated with the following supporting information:  In responding to this query please exercise your independent judgment.  The fact that a query is asked, does not imply that any particular answer is desired or expected.  Possible Clinical Conditions?   " Expected Acute Blood Loss Anemia  " Acute Blood Loss Anemia  " Precipitous drop in Hematocrit  " Other Condition  " Cannot Clinically Determine    Supporting Information:  Signs and Symptoms:   EBL per 01/17/12 Anesthesia record: .  Diagnostics: H&H on 5/16:    26.0/18.8 H&H on 5/13:    37.9/13.2  IV fluids / plasma expanders: 01/17/12:   Cell Saver: 01/17/12:   Albumin 5%:  Reviewed: Additional documentation provided 01/20/12 progress notes.   Thank You,  Marciano Sequin,  Clinical Documentation Specialist:  Pager:  [phone]  Health Information Management [place]

## 2012-01-19 ENCOUNTER — Inpatient Hospital Stay (HOSPITAL_COMMUNITY): Payer: Medicare Other

## 2012-01-19 DIAGNOSIS — J984 Other disorders of lung: Secondary | ICD-10-CM | POA: Diagnosis not present

## 2012-01-19 DIAGNOSIS — J90 Pleural effusion, not elsewhere classified: Secondary | ICD-10-CM | POA: Diagnosis not present

## 2012-01-19 DIAGNOSIS — Z09 Encounter for follow-up examination after completed treatment for conditions other than malignant neoplasm: Secondary | ICD-10-CM | POA: Diagnosis not present

## 2012-01-19 LAB — BASIC METABOLIC PANEL
BUN: 21 mg/dL (ref 6–23)
CO2: 25 mEq/L (ref 19–32)
Calcium: 8.5 mg/dL (ref 8.4–10.5)
Chloride: 99 mEq/L (ref 96–112)
Creatinine, Ser: 1.07 mg/dL (ref 0.50–1.35)
GFR calc Af Amer: 75 mL/min — ABNORMAL LOW (ref >90)
GFR calc non Af Amer: 65 mL/min — ABNORMAL LOW (ref >90)
Glucose, Bld: 109 mg/dL — ABNORMAL HIGH (ref 70–99)
Potassium: 4.2 mEq/L (ref 3.5–5.1)
Sodium: 133 mEq/L — ABNORMAL LOW (ref 135–145)

## 2012-01-19 LAB — CBC
HCT: 25 % — ABNORMAL LOW (ref 39.0–52.0)
Hemoglobin: 8.9 g/dL — ABNORMAL LOW (ref 13.0–17.0)
MCH: 31.6 pg (ref 26.0–34.0)
MCHC: 35.6 g/dL (ref 30.0–36.0)
MCV: 88.7 fL (ref 78.0–100.0)
Platelets: 124 10*3/uL — ABNORMAL LOW (ref 150–400)
RBC: 2.82 MIL/uL — ABNORMAL LOW (ref 4.22–5.81)
RDW: 14 % (ref 11.5–15.5)
WBC: 16.5 10*3/uL — ABNORMAL HIGH (ref 4.0–10.5)

## 2012-01-19 LAB — GLUCOSE, CAPILLARY
Glucose-Capillary: 127 mg/dL — ABNORMAL HIGH (ref 70–99)
Glucose-Capillary: 116 mg/dL — ABNORMAL HIGH (ref 70–99)
Glucose-Capillary: 109 mg/dL — ABNORMAL HIGH (ref 70–99)

## 2012-01-19 MED ORDER — MOVING RIGHT ALONG BOOK
Freq: Once | Status: AC
  Administered 2012-01-19: 09:00:00
  Filled 2012-01-19: qty 1

## 2012-01-19 MED ORDER — FUROSEMIDE 40 MG PO TABS
40.0000 mg | ORAL_TABLET | Freq: Two times a day (BID) | ORAL | Status: DC
  Administered 2012-01-19 – 2012-01-21 (×5): 40 mg via ORAL
  Filled 2012-01-19 (×8): qty 1

## 2012-01-19 MED ORDER — TRAMADOL HCL 50 MG PO TABS
50.0000 mg | ORAL_TABLET | ORAL | Status: DC | PRN
  Administered 2012-01-20 (×2): 100 mg via ORAL
  Filled 2012-01-19 (×2): qty 2

## 2012-01-19 MED ORDER — POLYETHYLENE GLYCOL 3350 17 G PO PACK
17.0000 g | PACK | Freq: Every day | ORAL | Status: DC
  Administered 2012-01-19 – 2012-01-21 (×2): 17 g via ORAL
  Filled 2012-01-19 (×3): qty 1

## 2012-01-19 MED ORDER — POTASSIUM CHLORIDE CRYS ER 20 MEQ PO TBCR
20.0000 meq | EXTENDED_RELEASE_TABLET | Freq: Two times a day (BID) | ORAL | Status: DC
  Administered 2012-01-19 – 2012-01-21 (×5): 20 meq via ORAL
  Filled 2012-01-19 (×6): qty 1

## 2012-01-19 MED ORDER — ASPIRIN EC 325 MG PO TBEC: 325.0000 mg | DELAYED_RELEASE_TABLET | Freq: Every day | ORAL | Status: DC

## 2012-01-19 MED ORDER — SODIUM CHLORIDE 0.9 % IJ SOLN
3.0000 mL | Freq: Two times a day (BID) | INTRAMUSCULAR | Status: DC
  Administered 2012-01-19 – 2012-01-20 (×3): 3 mL via INTRAVENOUS

## 2012-01-19 MED ORDER — METOPROLOL TARTRATE 12.5 MG HALF TABLET
12.5000 mg | ORAL_TABLET | Freq: Two times a day (BID) | ORAL | Status: DC
  Administered 2012-01-19 – 2012-01-21 (×5): 12.5 mg via ORAL
  Filled 2012-01-19 (×6): qty 1

## 2012-01-19 MED ORDER — INSULIN ASPART 100 UNIT/ML ~~LOC~~ SOLN: 0.0000 [IU] | Freq: Three times a day (TID) | SUBCUTANEOUS | Status: DC

## 2012-01-19 MED ORDER — SODIUM CHLORIDE 0.9 % IV SOLN: 250.0000 mL | INTRAVENOUS | Status: DC | PRN

## 2012-01-19 MED ORDER — SODIUM CHLORIDE 0.9 % IJ SOLN: 3.0000 mL | INTRAMUSCULAR | Status: DC | PRN

## 2012-01-19 NOTE — Progress Notes (Signed)
   CARDIOTHORACIC SURGERY PROGRESS NOTE   R2 Days Post-Op Procedure(s) (LRB): AORTIC VALVE REPLACEMENT (AVR) (N/A)  Subjective: No complaints.  Very mild soreness relieved by Ultram.  Objective: Vital signs: BP Readings from Last 1 Encounters:  01/19/12 136/49   Pulse Readings from Last 1 Encounters:  01/19/12 64   Resp Readings from Last 1 Encounters:  01/19/12 17   Temp Readings from Last 1 Encounters:  01/19/12 98.3 F (36.8 C) Oral    Hemodynamics:    Physical Exam:  Rhythm:   sinus  Breath sounds: clear  Heart sounds:  RRR  Incisions:  Dressings dry  Abdomen:  soft  Extremities:  warm   Intake/Output from previous day: 05/17 0701 - 05/18 0700 In: 1399 [P.O.:840; I.V.:453; IV Piggyback:106] Out: 1395 [Urine:1375; Chest Tube:20] Intake/Output this shift: Total I/O In: 20 [I.V.:20] Out: 30 [Urine:30]  Lab Results:  Basename 01/19/12 0350 01/18/12 1641 01/18/12 1630  WBC 16.5* -- 17.5*  HGB 8.9* 9.9* --  HCT 25.0* 29.0* --  PLT 124* -- 132*   BMET:  Basename 01/19/12 0350 01/18/12 1641 01/18/12 0415  NA 133* 136 --  K 4.2 4.0 --  CL 99 99 --  CO2 25 -- 21  GLUCOSE 109* 120* --  BUN 21 16 --  CREATININE 1.07 1.00 --  CALCIUM 8.5 -- 8.3*    CBG (last 3)   Basename 01/19/12 0742 01/18/12 1933 01/18/12 1539  GLUCAP 127* 147* 130*   ABG    Component Value Date/Time   PHART 7.324* 01/17/2012 1819   HCO3 22.9 01/17/2012 1819   TCO2 24 01/18/2012 1641   ACIDBASEDEF 3.0* 01/17/2012 1819   O2SAT 99.0 01/17/2012 1819   CXR: stable  Assessment/Plan: S/P Procedure(s) (LRB): AORTIC VALVE REPLACEMENT (AVR) (N/A)  Doing well POD2 Expected post op acute blood loss anemia, mild, stable Expected post op volume excess, mild, diuresing   Andrew Bonilla H 01/19/2012 9:02 AM

## 2012-01-19 NOTE — Progress Notes (Signed)
CARDIAC REHAB PHASE I   PRE:  Rate/Rhythm: 63 SR  BP:  Supine: 152/70  Sitting:   Standing:    SaO2: 95 RA  MODE:  Ambulation: 350 ft   POST:  Rate/Rhythem: 73  BP:  Supine: 140/70  Sitting:   Standing:    SaO2: 98 RA  Prentis Langdon [employer] received. Chart reviewed. Pt ambulated 350 ft assist x 2 with rolling walker. Tolerated well, no complaints. Returned to bed. VSS. Call bell in reach and assisted with lunch tray. Will follow up Monday. Electronically signed by Harriett Sine MS on Saturday Jan 20 2012 at 1308

## 2012-01-20 ENCOUNTER — Inpatient Hospital Stay (HOSPITAL_COMMUNITY): Payer: Medicare Other

## 2012-01-20 DIAGNOSIS — J811 Chronic pulmonary edema: Secondary | ICD-10-CM | POA: Diagnosis not present

## 2012-01-20 DIAGNOSIS — J9819 Other pulmonary collapse: Secondary | ICD-10-CM | POA: Diagnosis not present

## 2012-01-20 LAB — BASIC METABOLIC PANEL
BUN: 20 mg/dL (ref 6–23)
Creatinine, Ser: 0.89 mg/dL (ref 0.50–1.35)
GFR calc Af Amer: >90 mL/min (ref >90)
GFR calc non Af Amer: 80 mL/min — ABNORMAL LOW (ref >90)
Glucose, Bld: 108 mg/dL — ABNORMAL HIGH (ref 70–99)
Potassium: 4.2 mEq/L (ref 3.5–5.1)

## 2012-01-20 LAB — GLUCOSE, CAPILLARY
Glucose-Capillary: 100 mg/dL — ABNORMAL HIGH (ref 70–99)
Glucose-Capillary: 105 mg/dL — ABNORMAL HIGH (ref 70–99)
Glucose-Capillary: 101 mg/dL — ABNORMAL HIGH (ref 70–99)

## 2012-01-20 LAB — CBC
HCT: 24.4 % — ABNORMAL LOW (ref 39.0–52.0)
Hemoglobin: 8.5 g/dL — ABNORMAL LOW (ref 13.0–17.0)
MCH: 30.8 pg (ref 26.0–34.0)
MCHC: 34.8 g/dL (ref 30.0–36.0)
MCV: 88.4 fL (ref 78.0–100.0)
RDW: 14 % (ref 11.5–15.5)

## 2012-01-20 MED ORDER — TRAMADOL HCL 50 MG PO TABS: 50.0000 mg | ORAL_TABLET | ORAL | Status: AC | PRN

## 2012-01-20 MED ORDER — LACTULOSE 10 GM/15ML PO SOLN
20.0000 g | Freq: Once | ORAL | Status: DC
  Filled 2012-01-20: qty 30

## 2012-01-20 MED ORDER — POLYSACCHARIDE IRON COMPLEX 150 MG PO CAPS
150.0000 mg | ORAL_CAPSULE | Freq: Every day | ORAL | Status: DC
  Administered 2012-01-20 – 2012-01-21 (×2): 150 mg via ORAL
  Filled 2012-01-20 (×2): qty 1

## 2012-01-20 MED ORDER — ASPIRIN 325 MG PO TBEC: 325.0000 mg | DELAYED_RELEASE_TABLET | Freq: Every day | ORAL | Status: AC

## 2012-01-20 MED ORDER — BENZONATATE 200 MG PO CAPS: 200.0000 mg | ORAL_CAPSULE | Freq: Every day | ORAL | Status: DC | PRN

## 2012-01-20 MED ORDER — POLYSACCHARIDE IRON COMPLEX 150 MG PO CAPS: 150.0000 mg | ORAL_CAPSULE | Freq: Every day | ORAL | Status: DC

## 2012-01-20 MED ORDER — FUROSEMIDE 40 MG PO TABS: 40.0000 mg | ORAL_TABLET | Freq: Every day | ORAL | Status: DC

## 2012-01-20 MED ORDER — POTASSIUM CHLORIDE CRYS ER 20 MEQ PO TBCR: 20.0000 meq | EXTENDED_RELEASE_TABLET | Freq: Every day | ORAL | Status: DC

## 2012-01-20 MED ORDER — METOPROLOL TARTRATE 25 MG PO TABS: 12.5000 mg | ORAL_TABLET | Freq: Two times a day (BID) | ORAL | Status: DC

## 2012-01-20 NOTE — Discharge Instructions (Signed)
Activity: 1.May walk up steps                2.No lifting more than ten pounds for four weeks.                 3.No driving for four weeks.                4.Stop any activity that causes chest pain, shortness of breath, dizziness, sweating or excessive weakness.                5.Avoid straining.                6.Continue with your breathing exercises daily.  Diet: Low fat, Low salt and low sugar diet  Wound Care: May shower.  Clean wounds with mild soap and water daily. Contact the office at [phone] if any problems arise. Aortic Valve Replacement Care After Read the instructions outlined below and refer to this sheet for the next few weeks. These discharge instructions provide you with general information on caring for yourself after you leave the hospital. Your surgeon may also give you specific instructions. While your treatment has been planned according to the most current medical practices available, unavoidable complications occasionally occur. If you have any problems or questions after discharge, please call your surgeon. AFTER THE PROCEDURE  Full recovery from heart valve surgery can take several months.   Blood thinning (anticoagulation) treatment with warfarin is often prescribed for 6 weeks to 3 months after surgery for those with biological valves. It is prescribed for life for those with mechanical valves.   Recovery includes healing of the surgical incision. There is a gradual building of stamina and exercise abilities. An exercise program under the direction of a physical therapist may be recommended.   Once you have an artificial valve, your heart function and your life will return to normal. You usually feel better after surgery. Shortness of breath and fatigue should lessen. If your heart was already severely damaged before your surgery, you may continue to have problems.   You can usually resume most of your normal activities. You will have to continue to monitor your  condition. You need to watch out for blood clots and infections.   Artificial valves need to be replaced after a period of time. It is important that you see your caregiver regularly.   Some individuals with an aortic valve replacement need to take antibiotics before having dental work or other surgical procedures. This is called prophylactic antibiotic treatment. These drugs help to prevent infective endocarditis. Antibiotics are only recommended for individuals with the highest risk for developing infective endocarditis. Let your dentist and your caregiver know if you have a history of any of the following so that the necessary precautions can be taken:   A VSD.   A repaired VSD.   Endocarditis in the past.   An artificial (prosthetic) heart valve.  HOME CARE INSTRUCTIONS   Use all medications as prescribed.   Take your temperature every morning for the first week after surgery. Record these.   Weigh yourself every morning for at least the first week after surgery and record.   Do not lift more than 10 pounds (4.5 kg) until your breastbone (sternum) has healed. Avoid all activities which would place strain on your incision.   You may shower as soon as directed by your caregiver after surgery. Pat incisions dry. Do not rub incisions with washcloth or towel.   Avoid driving  for 4 to 6 weeks following surgery or as instructed.   Use your elastic stockings during the day. You should wear the stockings for at least 2 weeks after discharge or longer if your ankles are swollen. The stockings help blood flow and help reduce swelling in the legs. It is easiest to put the stockings on before you get out of bed in the morning. They should fit snugly.  Pain Control  If a prescription was given for a pain reliever, please follow your doctor's directions.   If the pain is not relieved by your medicine, becomes worse, or you have difficulty breathing, call your surgeon.  Activity  Take frequent  rest periods throughout the day.   Wait one week before returning to strenuous activities such as heavy lifting (more than 10 pounds), pushing or pulling.   Talk with your doctor about when you may return to work and your exercise routine.   Do not drive while taking prescription pain medication.  Nutrition  You may resume your normal diet.   Drink plenty of fluids (6-8 glasses a day).   Eat a well-balanced diet.   Call your caregiver for persistent nausea or vomiting.  Elimination Your normal bowel function should return. If constipation should occur, you may:  Take a mild laxative.   Add fruit and bran to your diet.   Drink more fluids.   Call your doctor if constipation is not relieved.  SEEK IMMEDIATE MEDICAL CARE IF:   You develop chest pain which is not coming from your surgical cut (incision).   You develop shortness of breath or have difficulty breathing.   You develop a temperature over 101 F (38.3 C).   You have a sudden weight gain. Let your caregiver know what the weight gain is.   You develop a rash.   You develop any reaction or side effects to medications given.   You have increased bleeding from wounds.   You see redness, swelling, or have increasing pain in wounds.   You have pus coming from your wound.   You develop lightheadedness or feel faint.  Document Released: 03/08/2005 Document Revised: 08/09/2011 Document Reviewed: 05/30/2005 [facility] Patient Information 2012 [place], [place].

## 2012-01-20 NOTE — Discharge Summary (Signed)
Physician Discharge Summary  Patient ID: Andrew Bonilla MRN: [id number] DOB/AGE: Oct 18, 1932 78 y.o.  Admit date: 01/17/2012 Discharge date: 01/21/2012  Admission Diagnoses: 1. Severe Aortic Stenosis 2. History of hyperlipidemia 3. History of hypertension  Discharge Diagnoses:  1. Severe Aortic Stenosis 2. History of hyperlipidemia 3. History of hypertension 4. Acute blood loss anemia 5. Mild thrombocytopenia (resolved prior to discharge)  Procedure: :AORTIC VALVE REPLACEMENT (AVR) using a Magna Ease Pericardial tissue valve (size 21 mm) by Dr. Donata Clay on 01/16/2012  History of Presenting Illness: This is a 78 year old Caucasian male who was recently diagnosed with moderate to severe aortic stenosis. In mid April last month, he had 2 episodes of syncope. He has also had progressive decrease in exercise tolerance and increasing dyspnea on exertion. He has had a known murmur of aortic stenosis since 2003, which by echo, previously had not been significant. He has no history of arrhythmia. He was admitted to the hospital and a head CT scan was negative. Carotid duplex exam showed no significant disease. A 2-D echo was performed that showed moderate to severe aortic stenosis with a valve area of 0.9 cm2. There is no previous history of rheumatic heart disease,no family history of aortic stenosis or aortic valve replacement surgery. The patient was evaluated by Dr. Rennis Golden and a cardiac catheterization was performed. This showed normal coronaries,  a calcified aortic valve, good LV function, no significant AI ,and a area of by cath of a 1.2. Because of his episodes of syncope and a significant 40 mm mercury gradient by echo, as well as fairly significant calcification of of the valve leaflets, aortic valve replacement was felt to be indicated. He was seen in the office on 01/09/2012 for consideration of aortic valve replacement. Potential risks, complications, benefits of the surgery were discussed with  the patient and he agreed to proceed. He was [facility] to [facility] on 01/16/2012 in order to undergo aortic valve replacement.  Brief Hospital Course:  He was extubated the afternoon of surgery. He remained afebrile and hemodynamically stable.  His [place], A-line, chest tubes, and Foley were all removed early in his hospital course. He was started on a low-dose beta blocker. He was found to be volume overloaded and diuresed accordingly. He was found have acute blood loss anemia. His H&H went as low as 8.5 and 24.4. He did not require postoperative transfusion. He was placed on Nu Iron. He also had mild thrombocytopenia. His platelet count went as low 124,000; however, this was resolved prior to discharge as his platelet count was up to 166,000. He was felt surgically stable for transfer from the intensive care unit to [facility]. for further convalescence on 01/18/2012. He has already been tolerating a diet. He is passing flatus and has been given a laxative to assist with bowel movement. His epicardial pacing wires will be removed in the morning, as well as his chest tube sutures. Provided he remains afebrile, hemodynamically stable, and pending morning round evaluation, he will be surgically stable for discharge on 01/21/2012.   Latest Vital Signs: Blood pressure 144/73, pulse 68, temperature 98.3 F (36.8 C), temperature source Oral, resp. rate 17, height 5\' 7"  (1.702 m), weight 244 lb 6.4 oz (110.859 kg), SpO2 100.00%.  Physical Exam: Cardiovascular: RRR, no murmurs, gallops, or rubs.  Pulmonary: Slightly diminished at right base; left lung mostly clear;no rales, wheezes, or rhonchi.  Abdomen: Soft, non tender, bowel sounds present.  Extremities: Mild bilateral lower extremity edema.  Wounds: Clean  and dry. No erythema or signs of infection.   Discharge Condition:Stable  Recent laboratory studies:  Lab Results  Component Value Date   WBC 13.8* 01/20/2012   HGB 8.5* 01/20/2012   HCT 24.4*  01/20/2012   MCV 88.4 01/20/2012   PLT 166 01/20/2012   Lab Results  Component Value Date   NA 128* 01/20/2012   K 4.2 01/20/2012   CL 91* 01/20/2012   CO2 25 01/20/2012   CREATININE 0.89 01/20/2012   GLUCOSE 108* 01/20/2012      Diagnostic Studies: Dg Chest 2 View  01/20/2012  *RADIOLOGY REPORT*  Clinical Data: Follow up atelectasis  CHEST - 2 VIEW  Comparison: 01/19/2012  Findings: Improved aeration in the lungs.  Improvement in bibasilar atelectasis and slightly improved lung volume.  Negative for heart failure or effusion.  Aortic valve replacement.  IMPRESSION: Improvement in bilateral edema.  Improved lung volume and decrease in bibasilar atelectasis.  Original Report Authenticated By: Camelia Phenes, M.D.    Discharge Medications: Medication List  As of 01/20/2012 11:15 AM   STOP taking these medications         aspirin 81 MG tablet      naproxen sodium 220 MG tablet      vitamin E 400 UNIT capsule         TAKE these medications         Acidophilus Tabs   Take 1 tablet by mouth 1 day or 1 dose.      aspirin 325 MG EC tablet   Take 1 tablet (325 mg total) by mouth daily.      atorvastatin 40 MG tablet   Commonly known as: LIPITOR   Take 40 mg by mouth daily.      benzonatate 200 MG capsule   Commonly known as: TESSALON   Take 1 capsule (200 mg total) by mouth daily as needed for cough.      beta carotene 15 MG capsule   Take 15 mg by mouth daily.      CALCIUM MAGNESIUM PO   Take 1 tablet by mouth daily.      CO Q 10 PO   Take 1 tablet by mouth daily.      CRANBERRY CONCENTRATE PO   Take 1 capsule by mouth daily.      esomeprazole 40 MG capsule   Commonly known as: NEXIUM   Take 40 mg by mouth daily before breakfast.      furosemide 40 MG tablet   Commonly known as: LASIX   Take 1 tablet (40 mg total) by mouth daily. For 5 days then stop.      GRAPESEED EXTRACT PO   Take 1 capsule by mouth daily.      iron polysaccharides 150 MG capsule   Commonly  known as: NIFEREX   Take 1 capsule (150 mg total) by mouth daily. For one month then stop.      metoprolol tartrate 25 MG tablet   Commonly known as: LOPRESSOR   Take 0.5 tablets (12.5 mg total) by mouth 2 (two) times daily.      mulitivitamin with minerals Tabs   Take 1 tablet by mouth daily.      polyethylene glycol packet   Commonly known as: MIRALAX / GLYCOLAX   Take 17 g by mouth daily.      potassium chloride SA 20 MEQ tablet   Commonly known as: K-DUR,KLOR-CON   Take 1 tablet (20 mEq total) by mouth daily. For 5  days then stop.      SAW PALMETTO PO   Take 1 tablet by mouth daily.      tadalafil 10 MG tablet   Commonly known as: CIALIS   Take 10 mg by mouth daily as needed. For e.d.      traMADol 50 MG tablet   Commonly known as: ULTRAM   Take 1-2 tablets (50-100 mg total) by mouth every 4 (four) hours as needed for pain.      VISION FORMULA PO   Take 1 tablet by mouth daily.            Follow Up Appointments: Follow-up Information    Follow up with VAN Dinah Beers, MD. (PA/LAT CXR to be taken (at [facility] which is in the same building as Dr. Zenaida Niece Trigt's office) 45 minutes prior to office appointment with Dr. Hazeline Junker will call with an appointment date and time)    Contact information:   [address] [place] [address] [phone]       Follow up with Chrystie Nose, MD. (Call for a follow up appointment for 2 weeks)    Contact information:   [address] [place] [address] [phone]          Signed: Doree Fudge MPA-C 01/20/2012, 11:15 AM

## 2012-01-20 NOTE — Progress Notes (Addendum)
3 Days Post-Op Procedure(s) (LRB): AORTIC VALVE REPLACEMENT (AVR) (N/A)  Subjective: Patient passing flatus but no bowel movement yet.  Objective: Vital signs in last 24 hours: Patient Vitals for the past 24 hrs:  BP Temp Temp src Pulse Resp SpO2  01/20/12 0447 144/73 mmHg 98.3 F (36.8 C) Oral 68  17  100 %  01/19/12 2207 128/74 mmHg - - 69  - -  01/19/12 1936 132/76 mmHg 97.9 F (36.6 C) Oral 67  17  93 %  01/19/12 1540 147/84 mmHg 98 F (36.7 C) Oral 68  16  98 %  01/19/12 1216 143/79 mmHg 98.4 F (36.9 C) Oral 66  16  93 %  01/19/12 1100 124/48 mmHg - - 58  16  98 %  01/19/12 1000 114/52 mmHg - - 59  16  98 %  01/19/12 0906 - - - - - 97 %  01/19/12 0900 147/46 mmHg - - 58  17  97 %  01/19/12 0845 - - - 61  19  97 %  01/19/12 0813 - 98.3 F (36.8 C) Oral - - -  01/19/12 0800 144/53 mmHg - - 54  14  98 %   Pre op weight  108 kg Current Weight  01/19/12 244 lb 6.4 oz (110.859 kg)      Intake/Output from previous day: 05/18 0701 - 05/19 0700 In: 280 [P.O.:240; I.V.:40] Out: 130 [Urine:130]   Physical Exam:  Cardiovascular: RRR, no murmurs, gallops, or rubs. Pulmonary: Slightly diminished at right base; left lung mostly clear;no rales, wheezes, or rhonchi. Abdomen: Soft, non tender, bowel sounds present. Extremities: Mild bilateral lower extremity edema. Wounds: Clean and dry.  No erythema or signs of infection.  Lab Results: CBC: Basename 01/19/12 0350 01/18/12 1641 01/18/12 1630  WBC 16.5* -- 17.5*  HGB 8.9* 9.9* --  HCT 25.0* 29.0* --  PLT 124* -- 132*   BMET:  Basename 01/19/12 0350 01/18/12 1641 01/18/12 0415  NA 133* 136 --  K 4.2 4.0 --  CL 99 99 --  CO2 25 -- 21  GLUCOSE 109* 120* --  BUN 21 16 --  CREATININE 1.07 1.00 --  CALCIUM 8.5 -- 8.3*    PT/INR:  Basename 01/17/12 1300  LABPROT 21.3*  INR 1.81*   ABG:  INR: Will add last result for INR, ABG once components are confirmed Will add last 4 CBG results once components are  confirmed  Assessment/Plan:  1. CV - SR.Continue Lopressor 12.5 bid. 2.  Pulmonary - Encourage incentive spirometer.CXR this am shows cardiomegaly, no ptx, improvement in aeration, small pleural effusions R>L. 3. Volume Overload - Continue Lasix 40 bid. 4.  Acute blood loss anemia - Last H/H 5/18 8.9/25.Start Nu Iron. 5.Mild thrombocytopenia-last platelets 124,000. 6.HGA1C 5.5 pre op.CBGs 116/109/100. Stop glucose checks and sliding scale. 7.Remove EPW in am. 8.LOC constipation. 9.Possible discharge in 1-2 days.  ZIMMERMAN,DONIELLE MPA-C 01/20/2012   I have seen and examined the patient and agree with the assessment and plan as outlined.  Laporcha Marchesi H 01/20/2012 12:09 PM

## 2012-01-21 MED FILL — Lidocaine HCl IV Inj 20 MG/ML: INTRAVENOUS | Qty: 5 | Status: AC

## 2012-01-21 MED FILL — Electrolyte-R (PH 7.4) Solution: INTRAVENOUS | Qty: 3000 | Status: AC

## 2012-01-21 MED FILL — Sodium Chloride IV Soln 0.9%: INTRAVENOUS | Qty: 1000 | Status: AC

## 2012-01-21 MED FILL — Heparin Sodium (Porcine) Inj 1000 Unit/ML: INTRAMUSCULAR | Qty: 10 | Status: AC

## 2012-01-21 MED FILL — Mannitol IV Soln 20%: INTRAVENOUS | Qty: 500 | Status: AC

## 2012-01-21 MED FILL — Sodium Chloride Irrigation Soln 0.9%: Qty: 3000 | Status: AC

## 2012-01-21 MED FILL — Magnesium Sulfate Inj 50%: INTRAMUSCULAR | Qty: 10 | Status: AC

## 2012-01-21 MED FILL — Heparin Sodium (Porcine) Inj 1000 Unit/ML: INTRAMUSCULAR | Qty: 30 | Status: AC

## 2012-01-21 MED FILL — Sodium Bicarbonate IV Soln 8.4%: INTRAVENOUS | Qty: 50 | Status: AC

## 2012-01-21 MED FILL — Potassium Chloride Inj 2 mEq/ML: INTRAVENOUS | Qty: 40 | Status: AC

## 2012-01-21 NOTE — Discharge Summary (Signed)
patient examined and medical record reviewed,agree with above note. VAN TRIGT III,Pacey 01/21/2012

## 2012-01-21 NOTE — Progress Notes (Signed)
CARDIAC REHAB PHASE I   PRE:  Rate/Rhythm: 66 SR  BP:  Supine:   Sitting: 144/60  Standing:    SaO2: 97 RA  MODE:  Ambulation: 550 ft   POST:  Rate/Rhythem: 71  BP:  Supine:   Sitting: 154/80  Standing:    SaO2: 99 RA 1010-1130 Assisted X 1 and used walker to ambulate. Gait steady with walker. VS stable. Completed discharge education with pt and wife. He agrees to [employer]. CRP in [place], will send referral.  Andrew Bonilla

## 2012-01-21 NOTE — Progress Notes (Addendum)
4 Days Post-Op Procedure(s) (LRB): AORTIC VALVE REPLACEMENT (AVR) (N/A) Subjective:  Andrew Bonilla has no complaints this morning.  He states he actually feels pretty good.  He had a bowel movement yesterday  Objective: Vital signs in last 24 hours: Temp:  [97.4 F (36.3 C)-98.2 F (36.8 C)] 97.5 F (36.4 C) (05/20 0354) Pulse Rate:  [52-66] 52  (05/20 0354) Cardiac Rhythm:  [-] Sinus bradycardia (05/19 2038) Resp:  [15-16] 15  (05/20 0354) BP: (130-144)/(68-83) 143/78 mmHg (05/20 0354) SpO2:  [95 %-98 %] 98 % (05/20 0354) Weight:  [244 lb 1.6 oz (110.723 kg)] 244 lb 1.6 oz (110.723 kg) (05/20 0354)  Intake/Output from previous day: 05/19 0701 - 05/20 0700 In: 720 [P.O.:720] Out: 2 [Stool:2]    General appearance: alert, cooperative and no distress Neurologic: intact Heart: regular rate and rhythm Lungs: clear to auscultation bilaterally Abdomen: soft, non-tender; bowel sounds normal; no masses,  no organomegaly Extremities: edema trace Wound: clean and dry  Lab Results:  Basename 01/20/12 0800 01/19/12 0350  WBC 13.8* 16.5*  HGB 8.5* 8.9*  HCT 24.4* 25.0*  PLT 166 124*   BMET:  Basename 01/20/12 0800 01/19/12 0350  NA 128* 133*  K 4.2 4.2  CL 91* 99  CO2 25 25  GLUCOSE 108* 109*  BUN 20 21  CREATININE 0.89 1.07  CALCIUM 8.7 8.5    PT/INR: No results found for this basename: LABPROT,INR in the last 72 hours ABG    Component Value Date/Time   PHART 7.324* 01/17/2012 1819   HCO3 22.9 01/17/2012 1819   TCO2 24 01/18/2012 1641   ACIDBASEDEF 3.0* 01/17/2012 1819   O2SAT 99.0 01/17/2012 1819   CBG (last 3)   Basename 01/20/12 1641 01/20/12 1135 01/20/12 0723  GLUCAP 101* 105* 100*    Assessment/Plan: S/P Procedure(s) (LRB): AORTIC VALVE REPLACEMENT (AVR) (N/A)  2. CV- NSR, will continue Lopressor, d/c EPW this morning 3. Resp- no acute issues, continue IS 4.Fluid Balance- good UO, weight up 3kg since admission, will continue Lasix 5. Dispo- patient is  doing well, would like to be discharged home today   LOS: 4 days    BARRETT, ERIN 01/21/2012   patient examined and medical record reviewed,agree with above note.Ready for discharge home VAN TRIGT III,Sergei 01/21/2012

## 2012-01-21 NOTE — Progress Notes (Signed)
D/C'd patients EPW and CTS per MD order and hospital policy.  Patient tolerated well.  Some bleeding, dressing applied.  Bedrest for 1 hour.  Will continue to monitor.

## 2012-02-01 ENCOUNTER — Other Ambulatory Visit: Payer: Self-pay | Admitting: Internal Medicine

## 2012-02-01 ENCOUNTER — Ambulatory Visit
Admission: RE | Admit: 2012-02-01 | Discharge: 2012-02-01 | Disposition: A | Discharge status: Home / Self Care | Payer: Medicare Other | Source: Ambulatory Visit | Attending: Internal Medicine | Admitting: Internal Medicine

## 2012-02-01 DIAGNOSIS — R5381 Other malaise: Secondary | ICD-10-CM | POA: Diagnosis not present

## 2012-02-01 DIAGNOSIS — Z01818 Encounter for other preprocedural examination: Secondary | ICD-10-CM | POA: Diagnosis not present

## 2012-02-01 DIAGNOSIS — R0600 Dyspnea, unspecified: Secondary | ICD-10-CM

## 2012-02-01 DIAGNOSIS — R0602 Shortness of breath: Secondary | ICD-10-CM | POA: Diagnosis not present

## 2012-02-01 DIAGNOSIS — R059 Cough, unspecified: Secondary | ICD-10-CM

## 2012-02-01 DIAGNOSIS — R05 Cough: Secondary | ICD-10-CM

## 2012-02-01 DIAGNOSIS — Z79899 Other long term (current) drug therapy: Secondary | ICD-10-CM | POA: Diagnosis not present

## 2012-02-01 DIAGNOSIS — D649 Anemia, unspecified: Secondary | ICD-10-CM | POA: Diagnosis not present

## 2012-02-01 DIAGNOSIS — N429 Disorder of prostate, unspecified: Secondary | ICD-10-CM | POA: Diagnosis not present

## 2012-02-01 DIAGNOSIS — R0989 Other specified symptoms and signs involving the circulatory and respiratory systems: Secondary | ICD-10-CM | POA: Diagnosis not present

## 2012-02-01 DIAGNOSIS — I359 Nonrheumatic aortic valve disorder, unspecified: Secondary | ICD-10-CM | POA: Diagnosis not present

## 2012-02-01 DIAGNOSIS — R0609 Other forms of dyspnea: Secondary | ICD-10-CM | POA: Diagnosis not present

## 2012-02-01 DIAGNOSIS — J984 Other disorders of lung: Secondary | ICD-10-CM | POA: Diagnosis not present

## 2012-02-06 ENCOUNTER — Other Ambulatory Visit: Payer: Self-pay | Admitting: Cardiothoracic Surgery

## 2012-02-06 DIAGNOSIS — I359 Nonrheumatic aortic valve disorder, unspecified: Secondary | ICD-10-CM

## 2012-02-08 ENCOUNTER — Other Ambulatory Visit: Payer: Self-pay | Admitting: *Deleted

## 2012-02-13 ENCOUNTER — Encounter: Payer: Self-pay | Admitting: Cardiothoracic Surgery

## 2012-02-13 ENCOUNTER — Ambulatory Visit (INDEPENDENT_AMBULATORY_CARE_PROVIDER_SITE_OTHER): Payer: Self-pay | Admitting: Cardiothoracic Surgery

## 2012-02-13 VITALS — BP 142/77 | HR 84 | Resp 18 | Ht 67.0 in | Wt 221.0 lb

## 2012-02-13 DIAGNOSIS — Z954 Presence of other heart-valve replacement: Secondary | ICD-10-CM

## 2012-02-13 DIAGNOSIS — I359 Nonrheumatic aortic valve disorder, unspecified: Secondary | ICD-10-CM

## 2012-02-13 DIAGNOSIS — N400 Enlarged prostate without lower urinary tract symptoms: Secondary | ICD-10-CM

## 2012-02-13 DIAGNOSIS — Z952 Presence of prosthetic heart valve: Secondary | ICD-10-CM

## 2012-02-13 DIAGNOSIS — I350 Nonrheumatic aortic (valve) stenosis: Secondary | ICD-10-CM

## 2012-02-13 NOTE — Progress Notes (Signed)
PCP is Georgann Housekeeper, MD Referring Provider is Chrystie Nose., MD  Chief Complaint  Patient presents with  . Routine Post Op    3 week f/u with CXR, S/P AVR on 01/17/12                           [address]            Andrew Bonilla [address]          [phone]      HPI: The patient returns for followup after undergoing a aVR with 21 mm pericardial tissue valve for aortic stenosis and history of syncope. No recurrent episodes of syncope following return home. Gradually improving strength and endurance. Walking daily probably 5 minutes or 500 feet. Minimal incisional pain. Total weight loss 15 pounds from preop. No problems with edema or CHF symptoms. Significant problems with polyuria at night nocturia every hour  Postoperative anemia on oral iron supplement Niferex daily hemoglobin on discharge 8.5   Past Medical History  Diagnosis Date  . Hyperlipemia   . Hypertension   . Constipation   . Hard of hearing   . Heart murmur     AORTIC STENOSIS ( DR [place] HILTY [facility]  . Recurrent upper respiratory infection (URI)     12/2011  . Prostate enlargement   . GERD (gastroesophageal reflux disease)   . Deaf, left     WEARS HEARING AID RIGHT EAR     Past Surgical History  Procedure Date  . Cardiac catheterization     12/2011  . Aortic valve replacement 01/17/2012    Procedure: AORTIC VALVE REPLACEMENT (AVR);  Surgeon: Kerin Perna, MD;  Location: [facility];  Service: Open Heart Surgery;  Laterality: N/A;    Family History  Problem Relation Age of Onset  . Coronary artery disease Father 1    MI/smoker  . Colon cancer      Social History History  Substance Use Topics  . Smoking status: Never Smoker   . Smokeless tobacco: Not on file  . Alcohol Use: Yes     SOCIAL     Current Outpatient Prescriptions  Medication Sig Dispense Refill  . aspirin 81 MG tablet Take 81 mg by mouth daily.      Marland Kitchen atorvastatin (LIPITOR) 40 MG tablet Take 40 mg by mouth daily.        . benzonatate (TESSALON) 200 MG capsule Take 1 capsule (200 mg total) by mouth daily as needed for cough.  20 capsule    . esomeprazole (NEXIUM) 40 MG capsule Take 40 mg by mouth daily before breakfast.      . iron polysaccharides (NIFEREX) 150 MG capsule Take 1 capsule (150 mg total) by mouth daily. For one month then stop.  30 capsule  0  . Lactobacillus (ACIDOPHILUS) TABS Take 1 tablet by mouth 1 day or 1 dose.       . metoprolol tartrate (LOPRESSOR) 25 MG tablet Take 0.5 tablets (12.5 mg total) by mouth 2 (two) times daily.  30 tablet  0  . polyethylene glycol (MIRALAX / GLYCOLAX) packet Take 17 g by mouth daily.      . Saw Palmetto, Serenoa repens, (SAW PALMETTO PO) Take 1 tablet by mouth daily.      . tadalafil (CIALIS) 10 MG tablet Take 10 mg by mouth daily as needed. For e.d.        No Known Allergies  Review of Systems no  fever appetite improving overall strength improving  BP 142/77  Pulse 84  Resp 18  Ht 5\' 7"  (1.702 m)  Wt 221 lb (100.245 kg)  BMI 34.61 kg/m2  SpO2 97% Physical Exam Alert and oriented Sternal incision healingi and stable, lungs clear No murmur or gallop Extremities without edema Neuro intact Diagnostic Tests: Chest x-ray last taken May 31 no pleural effusion, low lung volumes, sternal wires intact  Impression: Stable course 3 weeks status post aVR Blood pressure elevated 145/80, we'll start preoperative Diovan 80 mg daily and continue Lopressor 12.5 twice a day Plan: Return for review in 4 weeks, continue oral iron for anemia. Driving and lifting restrictions discussed with patient and wife.

## 2012-02-13 NOTE — Patient Instructions (Signed)
Resume Diovan 80 mg daily (Outpatient cardiac rehabilitation He may start driving in 2 weeks Do not lift more than 15 pounds until mid August We will refer you to a urologist for frequent urination problems

## 2012-02-21 ENCOUNTER — Encounter (HOSPITAL_COMMUNITY)
Admission: RE | Admit: 2012-02-21 | Discharge: 2012-02-21 | Disposition: A | Discharge status: Home / Self Care | Payer: Medicare Other | Source: Ambulatory Visit | Attending: Internal Medicine | Admitting: Internal Medicine

## 2012-02-21 DIAGNOSIS — Z79899 Other long term (current) drug therapy: Secondary | ICD-10-CM | POA: Insufficient documentation

## 2012-02-21 DIAGNOSIS — Z954 Presence of other heart-valve replacement: Secondary | ICD-10-CM | POA: Insufficient documentation

## 2012-02-21 DIAGNOSIS — R059 Cough, unspecified: Secondary | ICD-10-CM | POA: Diagnosis not present

## 2012-02-21 DIAGNOSIS — Z7982 Long term (current) use of aspirin: Secondary | ICD-10-CM | POA: Insufficient documentation

## 2012-02-21 DIAGNOSIS — E785 Hyperlipidemia, unspecified: Secondary | ICD-10-CM | POA: Insufficient documentation

## 2012-02-21 DIAGNOSIS — R05 Cough: Secondary | ICD-10-CM | POA: Diagnosis not present

## 2012-02-21 DIAGNOSIS — Z5189 Encounter for other specified aftercare: Secondary | ICD-10-CM | POA: Insufficient documentation

## 2012-02-21 DIAGNOSIS — I359 Nonrheumatic aortic valve disorder, unspecified: Secondary | ICD-10-CM | POA: Insufficient documentation

## 2012-02-21 DIAGNOSIS — I10 Essential (primary) hypertension: Secondary | ICD-10-CM | POA: Insufficient documentation

## 2012-02-21 NOTE — Progress Notes (Signed)
Cardiac Rehab Medication Review by a Pharmacist  Does the patient  feel that his/her medications are working for him/her?  yes  Has the patient been experiencing any side effects to the medications prescribed?  no  Does the patient measure his/her own blood pressure or blood glucose at home?  yes   Does the patient have any problems obtaining medications due to transportation or finances?   no  Understanding of regimen: fair Understanding of indications: fair Potential of compliance: good    Pharmacist comments:     Wynonia Hazard 02/21/2012 8:31 AM

## 2012-02-25 ENCOUNTER — Encounter (HOSPITAL_COMMUNITY)
Admission: RE | Admit: 2012-02-25 | Discharge: 2012-02-25 | Disposition: A | Discharge status: Home / Self Care | Payer: Medicare Other | Source: Ambulatory Visit | Attending: Internal Medicine | Admitting: Internal Medicine

## 2012-02-25 DIAGNOSIS — Z79899 Other long term (current) drug therapy: Secondary | ICD-10-CM | POA: Diagnosis not present

## 2012-02-25 DIAGNOSIS — Z954 Presence of other heart-valve replacement: Secondary | ICD-10-CM | POA: Diagnosis not present

## 2012-02-25 DIAGNOSIS — E785 Hyperlipidemia, unspecified: Secondary | ICD-10-CM | POA: Diagnosis not present

## 2012-02-25 DIAGNOSIS — I359 Nonrheumatic aortic valve disorder, unspecified: Secondary | ICD-10-CM | POA: Diagnosis not present

## 2012-02-25 DIAGNOSIS — Z5189 Encounter for other specified aftercare: Secondary | ICD-10-CM | POA: Diagnosis not present

## 2012-02-25 DIAGNOSIS — I10 Essential (primary) hypertension: Secondary | ICD-10-CM | POA: Diagnosis not present

## 2012-02-25 DIAGNOSIS — Z7982 Long term (current) use of aspirin: Secondary | ICD-10-CM | POA: Diagnosis not present

## 2012-02-26 ENCOUNTER — Other Ambulatory Visit: Payer: Self-pay | Admitting: *Deleted

## 2012-02-26 DIAGNOSIS — I2510 Atherosclerotic heart disease of native coronary artery without angina pectoris: Secondary | ICD-10-CM

## 2012-02-26 MED ORDER — METOPROLOL TARTRATE 25 MG PO TABS: 25.0000 mg | ORAL_TABLET | Freq: Two times a day (BID) | ORAL | Status: DC

## 2012-02-26 NOTE — Progress Notes (Addendum)
Pt started cardiac rehab today.  Pt tolerated light exercise without difficulty. Telemetry normal sinus rhythm.  Patient tolerated light exercise without difficulty. Will continue to monitor the patient throughout  the program.

## 2012-02-27 ENCOUNTER — Encounter (HOSPITAL_COMMUNITY)
Admission: RE | Admit: 2012-02-27 | Discharge: 2012-02-27 | Disposition: A | Discharge status: Home / Self Care | Payer: Medicare Other | Source: Ambulatory Visit | Attending: Internal Medicine | Admitting: Internal Medicine

## 2012-02-29 ENCOUNTER — Encounter (HOSPITAL_COMMUNITY)
Admission: RE | Admit: 2012-02-29 | Discharge: 2012-02-29 | Disposition: A | Discharge status: Home / Self Care | Payer: Medicare Other | Source: Ambulatory Visit | Attending: Internal Medicine | Admitting: Internal Medicine

## 2012-03-03 ENCOUNTER — Encounter (HOSPITAL_COMMUNITY)
Admission: RE | Admit: 2012-03-03 | Discharge: 2012-03-03 | Disposition: A | Discharge status: Home / Self Care | Payer: Medicare Other | Source: Ambulatory Visit | Attending: Internal Medicine | Admitting: Internal Medicine

## 2012-03-03 DIAGNOSIS — I10 Essential (primary) hypertension: Secondary | ICD-10-CM | POA: Insufficient documentation

## 2012-03-03 DIAGNOSIS — H9190 Unspecified hearing loss, unspecified ear: Secondary | ICD-10-CM | POA: Diagnosis not present

## 2012-03-03 DIAGNOSIS — Z952 Presence of prosthetic heart valve: Secondary | ICD-10-CM | POA: Diagnosis not present

## 2012-03-03 DIAGNOSIS — K219 Gastro-esophageal reflux disease without esophagitis: Secondary | ICD-10-CM | POA: Insufficient documentation

## 2012-03-03 DIAGNOSIS — Z5189 Encounter for other specified aftercare: Secondary | ICD-10-CM | POA: Diagnosis not present

## 2012-03-03 NOTE — Progress Notes (Signed)
Reviewed home exercise with pt today.  Pt plans to walk and use treadmill, bike and stepper at home for exercise.  Reviewed THR, pulse, RPE, sign and symptoms, and when to call 911 or MD.  Pt voiced understanding. Fabio Pierce, MA, ACSM RCEP

## 2012-03-05 ENCOUNTER — Encounter (HOSPITAL_COMMUNITY)
Admission: RE | Admit: 2012-03-05 | Discharge: 2012-03-05 | Disposition: A | Discharge status: Home / Self Care | Payer: Medicare Other | Source: Ambulatory Visit | Attending: Internal Medicine | Admitting: Internal Medicine

## 2012-03-07 ENCOUNTER — Encounter (HOSPITAL_COMMUNITY): Payer: Medicare Other

## 2012-03-07 ENCOUNTER — Encounter (HOSPITAL_COMMUNITY)
Admission: RE | Admit: 2012-03-07 | Discharge: 2012-03-07 | Disposition: A | Discharge status: Home / Self Care | Payer: Medicare Other | Source: Ambulatory Visit | Attending: Internal Medicine | Admitting: Internal Medicine

## 2012-03-07 NOTE — Progress Notes (Signed)
Andrew Bonilla 78 y.o. male       Nutrition Screen                                                                    YES  NO Do you live in a nursing home?  X   Do you eat out more than 3 times/week?    X If yes, how many times per week do you eat out?     Do you have food allergies?   X If yes, what are you allergic to?  Have you gained or lost more than 10 lbs without trying? Due to operation              X  If yes, how much weight have you lost and over what time period? 25 lbs lost over 6 weeks  Do you want to lose weight?    X  If yes, what is a goal weight or amount of weight you would like to lose?   Do you eat alone most of the time?   X   Do you eat less than 2 meals/day?  X If yes, how many meals do you eat?    Do you drink more than 3 alcohol drinks/day?  X If yes, how many drinks per day?   Are you having trouble with constipation? * X  If yes, what are you doing to help relieve constipation? Miralax  Do you have financial difficulties with buying food?*    X   Are you experiencing regular nausea/ vomiting?*     X   Do you have a poor appetite? *                                        X   Do you have trouble chewing/swallowing? *   X    Pt with diagnoses of:  X GERD          X AVR/MVR/ICD      X Dyslipidemia  / HDL< 40 / LDL>70 / High TG      X %  Body fat >goal / Body Mass Index >25 X HTN / BP >120/80 X MI       Pt Risk Score   6       Diagnosis Risk Score  25       Total Risk Score   31                         High Risk               X Low Risk    HT: 67" Ht Readings from Last 1 Encounters:  02/21/12 5\' 9"  (1.753 m)    WT:   222.6 lb (101.2 kg) Wt Readings from Last 3 Encounters:  02/21/12 223 lb 1.7 oz (101.2 kg)  02/13/12 221 lb (100.245 kg)  01/21/12 244 lb 1.6 oz (110.723 kg)     IBW 67.3 150%IBW BMI 34.9 36.6%body fat  Meds reviewed: MVI, Saw Palmetto, Lactobacillus Past Medical History  Diagnosis Date  . Hyperlipemia   . Hypertension   .  Constipation   . Hard of hearing   . Heart murmur     AORTIC STENOSIS ( DR [place] HILTY [facility]  . Recurrent upper respiratory infection (URI)     12/2011  . Prostate enlargement   . GERD (gastroesophageal reflux disease)   . Deaf, left     WEARS HEARING AID RIGHT EAR        Activity level: Pt is active  Wt goal: 198-210 lb ( 90.3-95.7 kg) Current tobacco use? No Food/Drug Interaction? No Labs:  Lipid Panel  No results found for this basename: chol, trig, hdl, cholhdl, vldl, ldlcalc   Lab Results  Component Value Date   HGBA1C 5.5 01/14/2012   01/20/12 Glucose 108  LDL goal: < 100      MI, DM, Carotid or PVD and > 2:      HTN, family h/o, > 76 yo male Estimated Daily Nutrition Needs for: ? wt loss  1550-2050 Kcal , Total Fat 40-55gm, Saturated Fat 10-14 gm, Trans Fat 1.5-2.1 gm,  Sodium less than 1500 mg

## 2012-03-10 ENCOUNTER — Encounter (HOSPITAL_COMMUNITY)
Admission: RE | Admit: 2012-03-10 | Discharge: 2012-03-10 | Disposition: A | Discharge status: Home / Self Care | Payer: Medicare Other | Source: Ambulatory Visit | Attending: Internal Medicine | Admitting: Internal Medicine

## 2012-03-12 ENCOUNTER — Encounter (HOSPITAL_COMMUNITY)
Admission: RE | Admit: 2012-03-12 | Discharge: 2012-03-12 | Disposition: A | Discharge status: Home / Self Care | Payer: Medicare Other | Source: Ambulatory Visit | Attending: Internal Medicine | Admitting: Internal Medicine

## 2012-03-12 ENCOUNTER — Encounter: Payer: Self-pay | Admitting: Cardiothoracic Surgery

## 2012-03-12 ENCOUNTER — Ambulatory Visit (INDEPENDENT_AMBULATORY_CARE_PROVIDER_SITE_OTHER): Payer: Self-pay | Admitting: Cardiothoracic Surgery

## 2012-03-12 VITALS — BP 115/71 | HR 60 | Resp 16 | Ht 67.0 in | Wt 213.0 lb

## 2012-03-12 DIAGNOSIS — I350 Nonrheumatic aortic (valve) stenosis: Secondary | ICD-10-CM

## 2012-03-12 DIAGNOSIS — Z09 Encounter for follow-up examination after completed treatment for conditions other than malignant neoplasm: Secondary | ICD-10-CM

## 2012-03-12 DIAGNOSIS — I359 Nonrheumatic aortic valve disorder, unspecified: Secondary | ICD-10-CM

## 2012-03-12 NOTE — Progress Notes (Signed)
PCP is Georgann Housekeeper, MD Referring Provider is Chrystie Nose., MD  Chief Complaint  Patient presents with  . Routine Post Op    4 wk f/u.Marland KitchenMarland KitchenAVR 01/17/12  . Aortic Stenosis                     [address]            [place] [address]          [phone]     HPI: 2 months after aortic valve replacement with a bioprosthetic valve for valvular aortic stenosis and history of syncope Doing well progressing at outpatient cardiac rehabilitation No symptoms of CHF or angina, some mild sternal soreness and tenderness, incision healing well, no history of sternal click, maintaining sinus rhythm Review of rehabilitation vital signs indicates heart rate is 60 to 70s at rest  Past Medical History  Diagnosis Date  . Hyperlipemia   . Hypertension   . Constipation   . Hard of hearing   . Heart murmur     AORTIC STENOSIS ( [place] HILTY [facility]  . Recurrent upper respiratory infection (URI)     12/2011  . Prostate enlargement   . GERD (gastroesophageal reflux disease)   . Deaf, left     WEARS HEARING AID RIGHT EAR     Past Surgical History  Procedure Date  . Cardiac catheterization     12/2011  . Aortic valve replacement 01/17/2012    Procedure: AORTIC VALVE REPLACEMENT (AVR);  Surgeon: Kerin Perna, MD;  Location: [facility] OR;  Service: Open Heart Surgery;  Laterality: N/A;    Family History  Problem Relation Age of Onset  . Coronary artery disease Father 49    MI/smoker  . Colon cancer      Social History History  Substance Use Topics  . Smoking status: Never Smoker   . Smokeless tobacco: Not on file  . Alcohol Use: Yes     SOCIAL     Current Outpatient Prescriptions  Medication Sig Dispense Refill  . acetaminophen (TYLENOL) 500 MG tablet Take 500 mg by mouth as needed. For pain      . aspirin 81 MG tablet Take 81 mg by mouth daily.      Marland Kitchen atorvastatin (LIPITOR) 40 MG tablet Take 40 mg by mouth daily.      Marland Kitchen esomeprazole (NEXIUM) 40 MG capsule Take 40 mg  by mouth daily before breakfast.      . iron polysaccharides (NIFEREX) 150 MG capsule Take 150 mg by mouth daily. Taking until July 12 per Dr. Clinton Sawyer      . Lactobacillus (ACIDOPHILUS) TABS Take 1 tablet by mouth 1 day or 1 dose.       . metoprolol tartrate (LOPRESSOR) 25 MG tablet Take 1 tablet (25 mg total) by mouth 2 (two) times daily. TAKE 1/2 TABLET 2 TIMES PER DAY  30 tablet  0  . Multiple Vitamin (MULTIVITAMIN) tablet Take 1 tablet by mouth daily.      . polyethylene glycol (MIRALAX / GLYCOLAX) packet Take 17 g by mouth daily.      . Saw Palmetto, Serenoa repens, (SAW PALMETTO PO) Take 1 tablet by mouth daily.      . tadalafil (CIALIS) 10 MG tablet Take 10 mg by mouth daily as needed. For e.d.      Marland Kitchen valsartan (DIOVAN) 80 MG tablet Take 80 mg by mouth daily.        No Known Allergies  Review of Systems no fever stable weight no edema  BP 115/71  Pulse 60  Resp 16  Ht 5\' 7"  (1.702 m)  Wt 213 lb (96.616 kg)  BMI 33.36 kg/m2  SpO2 98% Physical Exam Alert and comfortable Sternum well-healed No cardiac murmur Lungs clear  Diagnostic Tests: None today  Impression: Stable progress after aVR for aortic stenosis 2 months ago  Plan: Stop beta blockers stop iron supplement continue cardiac rehabilitation he may lift more than 20 pounds after mid August. Patient clear to travel to  [place] in September return to office in early September f before his trip

## 2012-03-13 DIAGNOSIS — K219 Gastro-esophageal reflux disease without esophagitis: Secondary | ICD-10-CM | POA: Diagnosis not present

## 2012-03-13 DIAGNOSIS — R059 Cough, unspecified: Secondary | ICD-10-CM | POA: Diagnosis not present

## 2012-03-13 DIAGNOSIS — R05 Cough: Secondary | ICD-10-CM | POA: Diagnosis not present

## 2012-03-14 ENCOUNTER — Encounter (HOSPITAL_COMMUNITY)
Admission: RE | Admit: 2012-03-14 | Discharge: 2012-03-14 | Disposition: A | Discharge status: Home / Self Care | Payer: Medicare Other | Source: Ambulatory Visit | Attending: Internal Medicine | Admitting: Internal Medicine

## 2012-03-17 ENCOUNTER — Encounter (HOSPITAL_COMMUNITY)
Admission: RE | Admit: 2012-03-17 | Discharge: 2012-03-17 | Payer: Medicare Other | Source: Ambulatory Visit | Attending: Internal Medicine | Admitting: Internal Medicine

## 2012-03-17 DIAGNOSIS — N529 Male erectile dysfunction, unspecified: Secondary | ICD-10-CM | POA: Diagnosis not present

## 2012-03-17 DIAGNOSIS — R351 Nocturia: Secondary | ICD-10-CM | POA: Diagnosis not present

## 2012-03-17 DIAGNOSIS — R3915 Urgency of urination: Secondary | ICD-10-CM | POA: Diagnosis not present

## 2012-03-17 DIAGNOSIS — N138 Other obstructive and reflux uropathy: Secondary | ICD-10-CM | POA: Diagnosis not present

## 2012-03-17 DIAGNOSIS — N401 Enlarged prostate with lower urinary tract symptoms: Secondary | ICD-10-CM | POA: Diagnosis not present

## 2012-03-17 NOTE — Progress Notes (Signed)
Andrew Bonilla 78 y.o. male Nutrition Note Spoke with pt.  Nutrition Plan, Nutrition Survey, and cholesterol goals reviewed with pt. Pt is following Step 2 of the Therapeutic Lifestyle Changes diet. Pt reports his UBW prior to procedure was 238#. Pt current wt is down 15.4#. Pt wants to lose 30# more. Pt states he is currently not doing anything different to try and lose wt. Per discussion, pt feels portion size is his primary issue preventing wt loss. Wt loss tips discussed. Pt reported problems with constipation on his nutrition screen, which pt now states has been resolved by the use of Miralax. Iron supplements noted to be discontinued and pt states he drinks "a lot of water", both of which should help with BM regularity.   Nutrition Diagnosis   Food-and nutrition-related knowledge deficit related to lack of exposure to information as related to diagnosis of: ? CVD   Obesity related to excessive energy intake as evidenced by a BMI of 34.9  Nutrition RX/ Estimated Daily Nutrition Needs for: wt loss  1550-2050 Kcal, 40-55 gm fat, 10-14 gm sat fat, 1.5-2.1 gm trans-fat, <1500 mg sodium   Nutrition Intervention   Pt's individual nutrition plan including cholesterol goals reviewed with pt.   Benefits of adopting Therapeutic Lifestyle Changes discussed when Medficts reviewed.   Pt to attend the Portion Distortion class   Pt to attend the  ? Nutrition I class                     ? Nutrition II class   Pt given handouts for: ? wt loss ? High Fiber Nutrition therapy   Continue client-centered nutrition education by RD, as part of interdisciplinary care. Goal(s)   Pt to identify food quantities necessary to achieve: ? wt loss to a goal wt of 198-210 lb (90.3-95.7 kg) at graduation from cardiac rehab.  Monitor and Evaluate progress toward nutrition goal with team.

## 2012-03-19 ENCOUNTER — Encounter (HOSPITAL_COMMUNITY)
Admission: RE | Admit: 2012-03-19 | Discharge: 2012-03-19 | Disposition: A | Discharge status: Home / Self Care | Payer: Medicare Other | Source: Ambulatory Visit | Attending: Internal Medicine | Admitting: Internal Medicine

## 2012-03-21 ENCOUNTER — Encounter (HOSPITAL_COMMUNITY)
Admission: RE | Admit: 2012-03-21 | Discharge: 2012-03-21 | Disposition: A | Discharge status: Home / Self Care | Payer: Medicare Other | Source: Ambulatory Visit | Attending: Internal Medicine | Admitting: Internal Medicine

## 2012-03-24 ENCOUNTER — Encounter (HOSPITAL_COMMUNITY)
Admission: RE | Admit: 2012-03-24 | Discharge: 2012-03-24 | Disposition: A | Discharge status: Home / Self Care | Payer: Medicare Other | Source: Ambulatory Visit | Attending: Internal Medicine | Admitting: Internal Medicine

## 2012-03-26 ENCOUNTER — Encounter (HOSPITAL_COMMUNITY)
Admission: RE | Admit: 2012-03-26 | Discharge: 2012-03-26 | Disposition: A | Discharge status: Home / Self Care | Payer: Medicare Other | Source: Ambulatory Visit | Attending: Internal Medicine | Admitting: Internal Medicine

## 2012-03-28 ENCOUNTER — Encounter (HOSPITAL_COMMUNITY)
Admission: RE | Admit: 2012-03-28 | Discharge: 2012-03-28 | Disposition: A | Discharge status: Home / Self Care | Payer: Medicare Other | Source: Ambulatory Visit | Attending: Internal Medicine | Admitting: Internal Medicine

## 2012-03-31 ENCOUNTER — Encounter (HOSPITAL_COMMUNITY)
Admission: RE | Admit: 2012-03-31 | Discharge: 2012-03-31 | Disposition: A | Discharge status: Home / Self Care | Payer: Medicare Other | Source: Ambulatory Visit | Attending: Internal Medicine | Admitting: Internal Medicine

## 2012-04-02 ENCOUNTER — Encounter (HOSPITAL_COMMUNITY)
Admission: RE | Admit: 2012-04-02 | Discharge: 2012-04-02 | Disposition: A | Discharge status: Home / Self Care | Payer: Medicare Other | Source: Ambulatory Visit | Attending: Internal Medicine | Admitting: Internal Medicine

## 2012-04-04 ENCOUNTER — Encounter (HOSPITAL_COMMUNITY)
Admission: RE | Admit: 2012-04-04 | Discharge: 2012-04-04 | Disposition: A | Discharge status: Home / Self Care | Payer: Medicare Other | Source: Ambulatory Visit | Attending: Internal Medicine | Admitting: Internal Medicine

## 2012-04-04 DIAGNOSIS — Z952 Presence of prosthetic heart valve: Secondary | ICD-10-CM | POA: Diagnosis not present

## 2012-04-04 DIAGNOSIS — K219 Gastro-esophageal reflux disease without esophagitis: Secondary | ICD-10-CM | POA: Diagnosis not present

## 2012-04-04 DIAGNOSIS — I10 Essential (primary) hypertension: Secondary | ICD-10-CM | POA: Insufficient documentation

## 2012-04-04 DIAGNOSIS — Z5189 Encounter for other specified aftercare: Secondary | ICD-10-CM | POA: Insufficient documentation

## 2012-04-04 DIAGNOSIS — H9190 Unspecified hearing loss, unspecified ear: Secondary | ICD-10-CM | POA: Diagnosis not present

## 2012-04-07 ENCOUNTER — Encounter (HOSPITAL_COMMUNITY)
Admission: RE | Admit: 2012-04-07 | Discharge: 2012-04-07 | Disposition: A | Discharge status: Home / Self Care | Payer: Medicare Other | Source: Ambulatory Visit | Attending: Internal Medicine | Admitting: Internal Medicine

## 2012-04-08 NOTE — Progress Notes (Addendum)
Patient's blood pressure was noted at 170/74 on the nustep subsequent blood pressure improved. Exit blood pressure 102/66. Will fax exercise flow sheets to Dr. Blanchie Dessert office for review.

## 2012-04-09 ENCOUNTER — Encounter (HOSPITAL_COMMUNITY)
Admission: RE | Admit: 2012-04-09 | Discharge: 2012-04-09 | Disposition: A | Discharge status: Home / Self Care | Payer: Medicare Other | Source: Ambulatory Visit | Attending: Internal Medicine | Admitting: Internal Medicine

## 2012-04-11 ENCOUNTER — Encounter (HOSPITAL_COMMUNITY)
Admission: RE | Admit: 2012-04-11 | Discharge: 2012-04-11 | Disposition: A | Discharge status: Home / Self Care | Payer: Medicare Other | Source: Ambulatory Visit | Attending: Internal Medicine | Admitting: Internal Medicine

## 2012-04-14 ENCOUNTER — Encounter (HOSPITAL_COMMUNITY)
Admission: RE | Admit: 2012-04-14 | Discharge: 2012-04-14 | Disposition: A | Discharge status: Home / Self Care | Payer: Medicare Other | Source: Ambulatory Visit | Attending: Internal Medicine | Admitting: Internal Medicine

## 2012-04-16 ENCOUNTER — Encounter (HOSPITAL_COMMUNITY)
Admission: RE | Admit: 2012-04-16 | Discharge: 2012-04-16 | Disposition: A | Discharge status: Home / Self Care | Payer: Medicare Other | Source: Ambulatory Visit | Attending: Internal Medicine | Admitting: Internal Medicine

## 2012-04-18 ENCOUNTER — Encounter (HOSPITAL_COMMUNITY)
Admission: RE | Admit: 2012-04-18 | Discharge: 2012-04-18 | Disposition: A | Discharge status: Home / Self Care | Payer: Medicare Other | Source: Ambulatory Visit | Attending: Internal Medicine | Admitting: Internal Medicine

## 2012-04-21 ENCOUNTER — Encounter (HOSPITAL_COMMUNITY): Payer: Medicare Other

## 2012-04-23 ENCOUNTER — Encounter (HOSPITAL_COMMUNITY): Payer: Medicare Other

## 2012-04-25 ENCOUNTER — Encounter (HOSPITAL_COMMUNITY)
Admission: RE | Admit: 2012-04-25 | Discharge: 2012-04-25 | Disposition: A | Discharge status: Home / Self Care | Payer: Medicare Other | Source: Ambulatory Visit | Attending: Internal Medicine | Admitting: Internal Medicine

## 2012-04-28 ENCOUNTER — Encounter (HOSPITAL_COMMUNITY)
Admission: RE | Admit: 2012-04-28 | Discharge: 2012-04-28 | Disposition: A | Discharge status: Home / Self Care | Payer: Medicare Other | Source: Ambulatory Visit | Attending: Internal Medicine | Admitting: Internal Medicine

## 2012-04-28 DIAGNOSIS — N401 Enlarged prostate with lower urinary tract symptoms: Secondary | ICD-10-CM | POA: Diagnosis not present

## 2012-04-28 DIAGNOSIS — N529 Male erectile dysfunction, unspecified: Secondary | ICD-10-CM | POA: Diagnosis not present

## 2012-04-28 DIAGNOSIS — R351 Nocturia: Secondary | ICD-10-CM | POA: Diagnosis not present

## 2012-04-28 DIAGNOSIS — N138 Other obstructive and reflux uropathy: Secondary | ICD-10-CM | POA: Diagnosis not present

## 2012-04-30 ENCOUNTER — Encounter (HOSPITAL_COMMUNITY)
Admission: RE | Admit: 2012-04-30 | Discharge: 2012-04-30 | Disposition: A | Discharge status: Home / Self Care | Payer: Medicare Other | Source: Ambulatory Visit | Attending: Internal Medicine | Admitting: Internal Medicine

## 2012-05-01 DIAGNOSIS — H35379 Puckering of macula, unspecified eye: Secondary | ICD-10-CM | POA: Diagnosis not present

## 2012-05-02 ENCOUNTER — Encounter (HOSPITAL_COMMUNITY): Payer: Medicare Other

## 2012-05-02 ENCOUNTER — Encounter (HOSPITAL_COMMUNITY)
Admission: RE | Admit: 2012-05-02 | Discharge: 2012-05-02 | Disposition: A | Discharge status: Home / Self Care | Payer: Medicare Other | Source: Ambulatory Visit | Attending: Internal Medicine | Admitting: Internal Medicine

## 2012-05-02 DIAGNOSIS — Z5189 Encounter for other specified aftercare: Secondary | ICD-10-CM | POA: Diagnosis not present

## 2012-05-02 DIAGNOSIS — Z952 Presence of prosthetic heart valve: Secondary | ICD-10-CM | POA: Insufficient documentation

## 2012-05-02 DIAGNOSIS — I10 Essential (primary) hypertension: Secondary | ICD-10-CM | POA: Diagnosis not present

## 2012-05-02 DIAGNOSIS — K219 Gastro-esophageal reflux disease without esophagitis: Secondary | ICD-10-CM | POA: Diagnosis not present

## 2012-05-02 DIAGNOSIS — I359 Nonrheumatic aortic valve disorder, unspecified: Secondary | ICD-10-CM | POA: Diagnosis not present

## 2012-05-02 DIAGNOSIS — H9190 Unspecified hearing loss, unspecified ear: Secondary | ICD-10-CM | POA: Diagnosis not present

## 2012-05-02 DIAGNOSIS — E782 Mixed hyperlipidemia: Secondary | ICD-10-CM | POA: Diagnosis not present

## 2012-05-06 ENCOUNTER — Ambulatory Visit
Admission: RE | Admit: 2012-05-06 | Discharge: 2012-05-06 | Disposition: A | Discharge status: Home / Self Care | Payer: Medicare Other | Source: Ambulatory Visit | Attending: Cardiothoracic Surgery | Admitting: Cardiothoracic Surgery

## 2012-05-06 DIAGNOSIS — I359 Nonrheumatic aortic valve disorder, unspecified: Secondary | ICD-10-CM

## 2012-05-06 DIAGNOSIS — Z954 Presence of other heart-valve replacement: Secondary | ICD-10-CM | POA: Diagnosis not present

## 2012-05-07 ENCOUNTER — Encounter: Payer: Self-pay | Admitting: Cardiothoracic Surgery

## 2012-05-07 ENCOUNTER — Encounter (HOSPITAL_COMMUNITY)
Admission: RE | Admit: 2012-05-07 | Discharge: 2012-05-07 | Disposition: A | Discharge status: Home / Self Care | Payer: Medicare Other | Source: Ambulatory Visit | Attending: Internal Medicine | Admitting: Internal Medicine

## 2012-05-07 ENCOUNTER — Ambulatory Visit (INDEPENDENT_AMBULATORY_CARE_PROVIDER_SITE_OTHER): Payer: Medicare Other | Admitting: Cardiothoracic Surgery

## 2012-05-07 VITALS — BP 110/72 | HR 72 | Resp 16 | Ht 67.0 in | Wt 217.0 lb

## 2012-05-07 DIAGNOSIS — I359 Nonrheumatic aortic valve disorder, unspecified: Secondary | ICD-10-CM

## 2012-05-07 DIAGNOSIS — I10 Essential (primary) hypertension: Secondary | ICD-10-CM | POA: Insufficient documentation

## 2012-05-07 DIAGNOSIS — Z952 Presence of prosthetic heart valve: Secondary | ICD-10-CM | POA: Diagnosis not present

## 2012-05-07 DIAGNOSIS — K219 Gastro-esophageal reflux disease without esophagitis: Secondary | ICD-10-CM | POA: Diagnosis not present

## 2012-05-07 DIAGNOSIS — Z5189 Encounter for other specified aftercare: Secondary | ICD-10-CM | POA: Diagnosis not present

## 2012-05-07 DIAGNOSIS — Z954 Presence of other heart-valve replacement: Secondary | ICD-10-CM

## 2012-05-07 DIAGNOSIS — I350 Nonrheumatic aortic (valve) stenosis: Secondary | ICD-10-CM

## 2012-05-07 DIAGNOSIS — H9190 Unspecified hearing loss, unspecified ear: Secondary | ICD-10-CM | POA: Insufficient documentation

## 2012-05-07 NOTE — Progress Notes (Signed)
PCP is Georgann Housekeeper, MD Referring Provider is Chrystie Nose., MD  Chief Complaint  Patient presents with  . Routine Post Op    2 month f/u with cxr...Marland KitchenAVR 01/17/12  doing well after biologic AVR for AS w/ syncope Completing phase 2 cardiac rehab No symptoms   Past Medical History  Diagnosis Date  . Hyperlipemia   . Hypertension   . Constipation   . Hard of hearing   . Heart murmur     AORTIC STENOSIS ( DR [place] HILTY [facility]  . Recurrent upper respiratory infection (URI)     12/2011  . Prostate enlargement   . GERD (gastroesophageal reflux disease)   . Deaf, left     WEARS HEARING AID RIGHT EAR     Past Surgical History  Procedure Date  . Cardiac catheterization     12/2011  . Aortic valve replacement 01/17/2012    Procedure: AORTIC VALVE REPLACEMENT (AVR);  Surgeon: Kerin Perna, MD;  Location: [facility] OR;  Service: Open Heart Surgery;  Laterality: N/A;    Family History  Problem Relation Age of Onset  . Coronary artery disease Father 46    MI/smoker  . Colon cancer      Social History History  Substance Use Topics  . Smoking status: Never Smoker   . Smokeless tobacco: Not on file  . Alcohol Use: Yes     SOCIAL     Current Outpatient Prescriptions  Medication Sig Dispense Refill  . acetaminophen (TYLENOL) 500 MG tablet Take 500 mg by mouth as needed. For pain      . aspirin 81 MG tablet Take 81 mg by mouth daily.      Marland Kitchen atorvastatin (LIPITOR) 40 MG tablet Take 40 mg by mouth daily.      Marland Kitchen esomeprazole (NEXIUM) 40 MG capsule Take 40 mg by mouth daily before breakfast.      . Lactobacillus (ACIDOPHILUS) TABS Take 1 tablet by mouth 1 day or 1 dose.       . Multiple Vitamin (MULTIVITAMIN) tablet Take 1 tablet by mouth daily.      . polyethylene glycol (MIRALAX / GLYCOLAX) packet Take 17 g by mouth daily.      . Saw Palmetto, Serenoa repens, (SAW PALMETTO PO) Take 1 tablet by mouth daily.      . tadalafil (CIALIS) 10 MG tablet Take 10 mg by mouth daily as needed.  For e.d.      Marland Kitchen valsartan (DIOVAN) 80 MG tablet Take 80 mg by mouth daily.        No Known Allergies  Review of Systemsno dizzyness, no syncope. No chest pain  BP 110/72  Pulse 72  Resp 16  Ht 5\' 7"  (1.702 m)  Wt 217 lb (98.431 kg)  BMI 33.99 kg/m2  SpO2 97% Physical Exam NSR Lungs clear No murmur  Diagnostic Tests: CXR clear  Impression: Doing well return in 3 months for f/u

## 2012-05-09 ENCOUNTER — Encounter (HOSPITAL_COMMUNITY)
Admission: RE | Admit: 2012-05-09 | Discharge: 2012-05-09 | Disposition: A | Discharge status: Home / Self Care | Payer: Medicare Other | Source: Ambulatory Visit | Attending: Internal Medicine | Admitting: Internal Medicine

## 2012-05-09 DIAGNOSIS — Z5189 Encounter for other specified aftercare: Secondary | ICD-10-CM | POA: Diagnosis not present

## 2012-05-09 DIAGNOSIS — I10 Essential (primary) hypertension: Secondary | ICD-10-CM | POA: Diagnosis not present

## 2012-05-09 DIAGNOSIS — H9190 Unspecified hearing loss, unspecified ear: Secondary | ICD-10-CM | POA: Diagnosis not present

## 2012-05-09 DIAGNOSIS — K219 Gastro-esophageal reflux disease without esophagitis: Secondary | ICD-10-CM | POA: Diagnosis not present

## 2012-05-09 DIAGNOSIS — Z952 Presence of prosthetic heart valve: Secondary | ICD-10-CM | POA: Diagnosis not present

## 2012-05-12 ENCOUNTER — Encounter (HOSPITAL_COMMUNITY)
Admission: RE | Admit: 2012-05-12 | Discharge: 2012-05-12 | Disposition: A | Discharge status: Home / Self Care | Payer: Medicare Other | Source: Ambulatory Visit | Attending: Internal Medicine | Admitting: Internal Medicine

## 2012-05-12 DIAGNOSIS — H9190 Unspecified hearing loss, unspecified ear: Secondary | ICD-10-CM | POA: Diagnosis not present

## 2012-05-12 DIAGNOSIS — Z5189 Encounter for other specified aftercare: Secondary | ICD-10-CM | POA: Diagnosis not present

## 2012-05-12 DIAGNOSIS — I10 Essential (primary) hypertension: Secondary | ICD-10-CM | POA: Diagnosis not present

## 2012-05-12 DIAGNOSIS — K219 Gastro-esophageal reflux disease without esophagitis: Secondary | ICD-10-CM | POA: Diagnosis not present

## 2012-05-12 DIAGNOSIS — Z952 Presence of prosthetic heart valve: Secondary | ICD-10-CM | POA: Diagnosis not present

## 2012-05-14 ENCOUNTER — Encounter (HOSPITAL_COMMUNITY)
Admission: RE | Admit: 2012-05-14 | Discharge: 2012-05-14 | Disposition: A | Discharge status: Home / Self Care | Payer: Medicare Other | Source: Ambulatory Visit | Attending: Internal Medicine | Admitting: Internal Medicine

## 2012-05-14 DIAGNOSIS — I10 Essential (primary) hypertension: Secondary | ICD-10-CM | POA: Diagnosis not present

## 2012-05-14 DIAGNOSIS — K219 Gastro-esophageal reflux disease without esophagitis: Secondary | ICD-10-CM | POA: Diagnosis not present

## 2012-05-14 DIAGNOSIS — Z5189 Encounter for other specified aftercare: Secondary | ICD-10-CM | POA: Diagnosis not present

## 2012-05-14 DIAGNOSIS — Z952 Presence of prosthetic heart valve: Secondary | ICD-10-CM | POA: Diagnosis not present

## 2012-05-14 DIAGNOSIS — H9190 Unspecified hearing loss, unspecified ear: Secondary | ICD-10-CM | POA: Diagnosis not present

## 2012-05-16 ENCOUNTER — Encounter (HOSPITAL_COMMUNITY)
Admission: RE | Admit: 2012-05-16 | Discharge: 2012-05-16 | Disposition: A | Discharge status: Home / Self Care | Payer: Medicare Other | Source: Ambulatory Visit | Attending: Internal Medicine | Admitting: Internal Medicine

## 2012-05-16 DIAGNOSIS — I10 Essential (primary) hypertension: Secondary | ICD-10-CM | POA: Diagnosis not present

## 2012-05-16 DIAGNOSIS — K219 Gastro-esophageal reflux disease without esophagitis: Secondary | ICD-10-CM | POA: Diagnosis not present

## 2012-05-16 DIAGNOSIS — H9190 Unspecified hearing loss, unspecified ear: Secondary | ICD-10-CM | POA: Diagnosis not present

## 2012-05-16 DIAGNOSIS — Z5189 Encounter for other specified aftercare: Secondary | ICD-10-CM | POA: Diagnosis not present

## 2012-05-16 DIAGNOSIS — Z952 Presence of prosthetic heart valve: Secondary | ICD-10-CM | POA: Diagnosis not present

## 2012-05-19 ENCOUNTER — Encounter (HOSPITAL_COMMUNITY)
Admission: RE | Admit: 2012-05-19 | Discharge: 2012-05-19 | Disposition: A | Discharge status: Home / Self Care | Payer: Medicare Other | Source: Ambulatory Visit | Attending: Internal Medicine | Admitting: Internal Medicine

## 2012-05-19 DIAGNOSIS — H9190 Unspecified hearing loss, unspecified ear: Secondary | ICD-10-CM | POA: Diagnosis not present

## 2012-05-19 DIAGNOSIS — I10 Essential (primary) hypertension: Secondary | ICD-10-CM | POA: Diagnosis not present

## 2012-05-19 DIAGNOSIS — Z952 Presence of prosthetic heart valve: Secondary | ICD-10-CM | POA: Diagnosis not present

## 2012-05-19 DIAGNOSIS — Z5189 Encounter for other specified aftercare: Secondary | ICD-10-CM | POA: Diagnosis not present

## 2012-05-19 DIAGNOSIS — K219 Gastro-esophageal reflux disease without esophagitis: Secondary | ICD-10-CM | POA: Diagnosis not present

## 2012-05-19 NOTE — Progress Notes (Signed)
[employer] today. Andrew Bonilla plans to continue exercise on his own. Andrew Bonilla goes on vacation this week.

## 2012-05-21 ENCOUNTER — Encounter (HOSPITAL_COMMUNITY): Payer: Medicare Other

## 2012-05-23 ENCOUNTER — Encounter (HOSPITAL_COMMUNITY): Payer: Medicare Other

## 2012-05-26 ENCOUNTER — Encounter (HOSPITAL_COMMUNITY): Payer: Medicare Other

## 2012-05-28 ENCOUNTER — Encounter (HOSPITAL_COMMUNITY): Payer: Medicare Other

## 2012-05-30 ENCOUNTER — Encounter (HOSPITAL_COMMUNITY): Payer: Medicare Other

## 2012-06-02 ENCOUNTER — Encounter (HOSPITAL_COMMUNITY): Payer: Medicare Other

## 2012-07-21 DIAGNOSIS — K219 Gastro-esophageal reflux disease without esophagitis: Secondary | ICD-10-CM | POA: Diagnosis not present

## 2012-07-21 DIAGNOSIS — Z0000 Encounter for general adult medical examination without abnormal findings: Secondary | ICD-10-CM | POA: Diagnosis not present

## 2012-07-21 DIAGNOSIS — R7309 Other abnormal glucose: Secondary | ICD-10-CM | POA: Diagnosis not present

## 2012-07-21 DIAGNOSIS — I6529 Occlusion and stenosis of unspecified carotid artery: Secondary | ICD-10-CM | POA: Diagnosis not present

## 2012-07-21 DIAGNOSIS — Z1331 Encounter for screening for depression: Secondary | ICD-10-CM | POA: Diagnosis not present

## 2012-07-21 DIAGNOSIS — I10 Essential (primary) hypertension: Secondary | ICD-10-CM | POA: Diagnosis not present

## 2012-07-21 DIAGNOSIS — E782 Mixed hyperlipidemia: Secondary | ICD-10-CM | POA: Diagnosis not present

## 2012-07-21 DIAGNOSIS — N400 Enlarged prostate without lower urinary tract symptoms: Secondary | ICD-10-CM | POA: Diagnosis not present

## 2012-07-21 DIAGNOSIS — E669 Obesity, unspecified: Secondary | ICD-10-CM | POA: Diagnosis not present

## 2012-07-22 DIAGNOSIS — D1801 Hemangioma of skin and subcutaneous tissue: Secondary | ICD-10-CM | POA: Diagnosis not present

## 2012-07-22 DIAGNOSIS — L821 Other seborrheic keratosis: Secondary | ICD-10-CM | POA: Diagnosis not present

## 2012-07-22 DIAGNOSIS — D2330 Other benign neoplasm of skin of unspecified part of face: Secondary | ICD-10-CM | POA: Diagnosis not present

## 2012-07-22 DIAGNOSIS — L819 Disorder of pigmentation, unspecified: Secondary | ICD-10-CM | POA: Diagnosis not present

## 2012-07-22 DIAGNOSIS — L578 Other skin changes due to chronic exposure to nonionizing radiation: Secondary | ICD-10-CM | POA: Diagnosis not present

## 2012-07-28 DIAGNOSIS — H35379 Puckering of macula, unspecified eye: Secondary | ICD-10-CM | POA: Diagnosis not present

## 2012-09-10 ENCOUNTER — Ambulatory Visit (INDEPENDENT_AMBULATORY_CARE_PROVIDER_SITE_OTHER): Payer: Medicare Other | Admitting: Cardiothoracic Surgery

## 2012-09-10 ENCOUNTER — Encounter: Payer: Self-pay | Admitting: Cardiothoracic Surgery

## 2012-09-10 VITALS — BP 132/80 | HR 87 | Resp 18 | Ht 67.0 in | Wt 217.0 lb

## 2012-09-10 DIAGNOSIS — I350 Nonrheumatic aortic (valve) stenosis: Secondary | ICD-10-CM

## 2012-09-10 DIAGNOSIS — Z954 Presence of other heart-valve replacement: Secondary | ICD-10-CM

## 2012-09-10 DIAGNOSIS — Z952 Presence of prosthetic heart valve: Secondary | ICD-10-CM

## 2012-09-10 DIAGNOSIS — I359 Nonrheumatic aortic valve disorder, unspecified: Secondary | ICD-10-CM

## 2012-09-10 NOTE — Progress Notes (Signed)
PCP is Georgann Housekeeper, MD Referring Provider is Chrystie Nose., MD  Chief Complaint  Patient presents with  . Routine Post Op    4 month post op f/u, S/P AVR on 01/17/2012    HPI: 79 year old gentleman underwent aVR with tissue valve may 2012 for aortic stenosis and syncope. Doing well postop without recurrent symptoms. Completed outpatient cardiac rehabilitation. No arrhythmias. No symptoms of CHF or angina.   Past Medical History  Diagnosis Date  . Hyperlipemia   . Hypertension   . Constipation   . Hard of hearing   . Heart murmur     AORTIC STENOSIS ( [place] [facility]  . Recurrent upper respiratory infection (URI)     12/2011  . Prostate enlargement   . GERD (gastroesophageal reflux disease)   . Deaf, left     WEARS HEARING AID RIGHT EAR     Past Surgical History  Procedure Date  . Cardiac catheterization     12/2011  . Aortic valve replacement 01/17/2012    Procedure: AORTIC VALVE REPLACEMENT (AVR);  Surgeon: Kerin Perna, MD;  Location: [facility] OR;  Service: Open Heart Surgery;  Laterality: N/A;    Family History  Problem Relation Age of Onset  . Coronary artery disease Father 75    MI/smoker  . Colon cancer      Social History History  Substance Use Topics  . Smoking status: Never Smoker   . Smokeless tobacco: Not on file  . Alcohol Use: Yes     Comment: SOCIAL     Current Outpatient Prescriptions  Medication Sig Dispense Refill  . acetaminophen (TYLENOL) 500 MG tablet Take 500 mg by mouth as needed. For pain      . aspirin 81 MG tablet Take 81 mg by mouth daily.      Marland Kitchen atorvastatin (LIPITOR) 40 MG tablet Take 40 mg by mouth daily.      Marland Kitchen esomeprazole (NEXIUM) 40 MG capsule Take 40 mg by mouth daily before breakfast.      . Lactobacillus (ACIDOPHILUS) TABS Take 1 tablet by mouth 1 day or 1 dose.       . Multiple Vitamin (MULTIVITAMIN) tablet Take 1 tablet by mouth daily.      . polyethylene glycol (MIRALAX / GLYCOLAX) packet Take 17 g by mouth daily.       . Saw Palmetto, Serenoa repens, (SAW PALMETTO PO) Take 1 tablet by mouth daily.      . tadalafil (CIALIS) 10 MG tablet Take 10 mg by mouth daily as needed. For e.d.      Marland Kitchen valsartan (DIOVAN) 80 MG tablet Take 80 mg by mouth daily.      . vitamin E 400 UNIT capsule Take 400 Units by mouth daily.        No Known Allergies  Review of Systems incision well-healed, no fevers, no dental issues  BP 132/80  Pulse 87  Resp 18  Ht 5\' 7"  (1.702 m)  Wt 217 lb (98.431 kg)  BMI 33.99 kg/m2  SpO2 98% Physical Exam Alert and comfortable Lungs clear Sternal incision healed No significant murmur or gallop  Diagnostic Tests: No chest x-ray performed today  Impression:  Doing well 8 months following aVR Increased activity level discussed Importance of daily exercise, good dental hygiene and weight control discussed to maintain his heart valve function Plan: Return in the fall for review

## 2012-10-01 DIAGNOSIS — N529 Male erectile dysfunction, unspecified: Secondary | ICD-10-CM | POA: Diagnosis not present

## 2012-10-01 DIAGNOSIS — R3915 Urgency of urination: Secondary | ICD-10-CM | POA: Diagnosis not present

## 2012-10-01 DIAGNOSIS — N138 Other obstructive and reflux uropathy: Secondary | ICD-10-CM | POA: Diagnosis not present

## 2012-10-01 DIAGNOSIS — R351 Nocturia: Secondary | ICD-10-CM | POA: Diagnosis not present

## 2012-10-01 DIAGNOSIS — N401 Enlarged prostate with lower urinary tract symptoms: Secondary | ICD-10-CM | POA: Diagnosis not present

## 2012-12-08 DIAGNOSIS — I359 Nonrheumatic aortic valve disorder, unspecified: Secondary | ICD-10-CM | POA: Diagnosis not present

## 2012-12-08 DIAGNOSIS — I10 Essential (primary) hypertension: Secondary | ICD-10-CM | POA: Diagnosis not present

## 2012-12-08 DIAGNOSIS — E782 Mixed hyperlipidemia: Secondary | ICD-10-CM | POA: Diagnosis not present

## 2012-12-09 DIAGNOSIS — H35379 Puckering of macula, unspecified eye: Secondary | ICD-10-CM | POA: Diagnosis not present

## 2012-12-11 DIAGNOSIS — E782 Mixed hyperlipidemia: Secondary | ICD-10-CM | POA: Diagnosis not present

## 2013-01-05 ENCOUNTER — Other Ambulatory Visit: Payer: Self-pay | Admitting: Dermatology

## 2013-01-05 DIAGNOSIS — L819 Disorder of pigmentation, unspecified: Secondary | ICD-10-CM | POA: Diagnosis not present

## 2013-01-05 DIAGNOSIS — D1801 Hemangioma of skin and subcutaneous tissue: Secondary | ICD-10-CM | POA: Diagnosis not present

## 2013-01-05 DIAGNOSIS — L578 Other skin changes due to chronic exposure to nonionizing radiation: Secondary | ICD-10-CM | POA: Diagnosis not present

## 2013-01-05 DIAGNOSIS — L821 Other seborrheic keratosis: Secondary | ICD-10-CM | POA: Diagnosis not present

## 2013-01-05 DIAGNOSIS — D485 Neoplasm of uncertain behavior of skin: Secondary | ICD-10-CM | POA: Diagnosis not present

## 2013-01-05 DIAGNOSIS — L570 Actinic keratosis: Secondary | ICD-10-CM | POA: Diagnosis not present

## 2013-01-05 DIAGNOSIS — L259 Unspecified contact dermatitis, unspecified cause: Secondary | ICD-10-CM | POA: Diagnosis not present

## 2013-01-05 DIAGNOSIS — L910 Hypertrophic scar: Secondary | ICD-10-CM | POA: Diagnosis not present

## 2013-01-06 DIAGNOSIS — I10 Essential (primary) hypertension: Secondary | ICD-10-CM | POA: Diagnosis not present

## 2013-01-06 DIAGNOSIS — K219 Gastro-esophageal reflux disease without esophagitis: Secondary | ICD-10-CM | POA: Diagnosis not present

## 2013-01-06 DIAGNOSIS — E782 Mixed hyperlipidemia: Secondary | ICD-10-CM | POA: Diagnosis not present

## 2013-01-06 DIAGNOSIS — N400 Enlarged prostate without lower urinary tract symptoms: Secondary | ICD-10-CM | POA: Diagnosis not present

## 2013-05-21 DIAGNOSIS — H40059 Ocular hypertension, unspecified eye: Secondary | ICD-10-CM | POA: Diagnosis not present

## 2013-05-21 DIAGNOSIS — H35379 Puckering of macula, unspecified eye: Secondary | ICD-10-CM | POA: Diagnosis not present

## 2013-05-21 DIAGNOSIS — H40039 Anatomical narrow angle, unspecified eye: Secondary | ICD-10-CM | POA: Diagnosis not present

## 2013-05-21 DIAGNOSIS — H2510 Age-related nuclear cataract, unspecified eye: Secondary | ICD-10-CM | POA: Diagnosis not present

## 2013-06-10 ENCOUNTER — Ambulatory Visit: Payer: Medicare Other | Admitting: Cardiothoracic Surgery

## 2013-06-17 ENCOUNTER — Ambulatory Visit (INDEPENDENT_AMBULATORY_CARE_PROVIDER_SITE_OTHER): Payer: Medicare Other | Admitting: Cardiothoracic Surgery

## 2013-06-17 ENCOUNTER — Encounter: Payer: Self-pay | Admitting: Cardiothoracic Surgery

## 2013-06-17 VITALS — BP 134/77 | HR 84 | Resp 20 | Ht 67.0 in | Wt 217.0 lb

## 2013-06-17 DIAGNOSIS — I359 Nonrheumatic aortic valve disorder, unspecified: Secondary | ICD-10-CM | POA: Diagnosis not present

## 2013-06-17 DIAGNOSIS — Z952 Presence of prosthetic heart valve: Secondary | ICD-10-CM

## 2013-06-17 DIAGNOSIS — Z954 Presence of other heart-valve replacement: Secondary | ICD-10-CM | POA: Diagnosis not present

## 2013-06-17 DIAGNOSIS — I350 Nonrheumatic aortic (valve) stenosis: Secondary | ICD-10-CM

## 2013-06-17 NOTE — Progress Notes (Signed)
PCP is Georgann Housekeeper, MD Referring Provider is Chrystie Nose., MD  Chief Complaint  Patient presents with  . Routine Post Op    9 month f/u      HPI: 18 months after aortic valve replacement for aortic stenosis with syncope. Pericardial tissue valve was used. Doing well without recurrent symptoms syncope or CHF. Blood pressure well controlled on Diovan.   Past Medical History  Diagnosis Date  . Hyperlipemia   . Hypertension   . Constipation   . Hard of hearing   . Heart murmur     AORTIC STENOSIS ( [place] [facility]  . Recurrent upper respiratory infection (URI)     12/2011  . Prostate enlargement   . GERD (gastroesophageal reflux disease)   . Deaf, left     WEARS HEARING AID RIGHT EAR     Past Surgical History  Procedure Laterality Date  . Cardiac catheterization      12/2011  . Aortic valve replacement  01/17/2012    Procedure: AORTIC VALVE REPLACEMENT (AVR);  Surgeon: Kerin Perna, MD;  Location: [facility] OR;  Service: Open Heart Surgery;  Laterality: N/A;    Family History  Problem Relation Age of Onset  . Coronary artery disease Father 58    MI/smoker  . Colon cancer      Social History History  Substance Use Topics  . Smoking status: Never Smoker   . Smokeless tobacco: Not on file  . Alcohol Use: Yes     Comment: SOCIAL     Current Outpatient Prescriptions  Medication Sig Dispense Refill  . aspirin 81 MG tablet Take 81 mg by mouth daily.      Marland Kitchen atorvastatin (LIPITOR) 40 MG tablet Take 40 mg by mouth daily.      . Calcium 500 MG tablet Take 600 mg by mouth.      . cholecalciferol (VITAMIN D) 1000 UNITS tablet Take 1,000 Units by mouth daily.      Marland Kitchen esomeprazole (NEXIUM) 40 MG capsule Take 40 mg by mouth daily before breakfast.      . Lactobacillus (ACIDOPHILUS) TABS Take 1 tablet by mouth 1 day or 1 dose.       . Multiple Vitamin (MULTIVITAMIN) tablet Take 1 tablet by mouth daily.      . polyethylene glycol (MIRALAX / GLYCOLAX) packet Take 17 g by mouth  daily.      . Saw Palmetto, Serenoa repens, (SAW PALMETTO PO) Take 1 tablet by mouth daily.      . tadalafil (CIALIS) 10 MG tablet Take 10 mg by mouth daily as needed. For e.d.      Marland Kitchen valsartan (DIOVAN) 80 MG tablet Take 80 mg by mouth daily.      . vitamin E 400 UNIT capsule Take 400 Units by mouth daily.       No current facility-administered medications for this visit.    No Known Allergies  Review of Systems is gained proximally 10 pounds since last visit. He denies any active dental complaints. He has dental hygiene twice a year.  BP 134/77  Pulse 84  Resp 20  Ht 5\' 7"  (1.702 m)  Wt 217 lb (98.431 kg)  BMI 33.98 kg/m2  SpO2 98% Physical Exam Alert and pleasant Breath sounds clear and equal Heart rhythm regular No murmur or gallop Minimal pedal edema Peripheral pulses intact  Diagnostic Tests: None  Impression:  Doing well 18 months postop Plan:   Continue to follow every 6 months. Continue current  medications and activities. Importance of weight control reviewed with patient with target weight 200 pounds

## 2013-07-22 DIAGNOSIS — R7309 Other abnormal glucose: Secondary | ICD-10-CM | POA: Diagnosis not present

## 2013-07-22 DIAGNOSIS — Z954 Presence of other heart-valve replacement: Secondary | ICD-10-CM | POA: Diagnosis not present

## 2013-07-22 DIAGNOSIS — Z23 Encounter for immunization: Secondary | ICD-10-CM | POA: Diagnosis not present

## 2013-07-22 DIAGNOSIS — Z0000 Encounter for general adult medical examination without abnormal findings: Secondary | ICD-10-CM | POA: Diagnosis not present

## 2013-07-22 DIAGNOSIS — I10 Essential (primary) hypertension: Secondary | ICD-10-CM | POA: Diagnosis not present

## 2013-07-22 DIAGNOSIS — K219 Gastro-esophageal reflux disease without esophagitis: Secondary | ICD-10-CM | POA: Diagnosis not present

## 2013-07-22 DIAGNOSIS — Z1331 Encounter for screening for depression: Secondary | ICD-10-CM | POA: Diagnosis not present

## 2013-07-22 DIAGNOSIS — E782 Mixed hyperlipidemia: Secondary | ICD-10-CM | POA: Diagnosis not present

## 2013-07-22 DIAGNOSIS — N400 Enlarged prostate without lower urinary tract symptoms: Secondary | ICD-10-CM | POA: Diagnosis not present

## 2013-08-04 ENCOUNTER — Other Ambulatory Visit: Payer: Self-pay | Admitting: Dermatology

## 2013-08-04 DIAGNOSIS — IMO0002 Reserved for concepts with insufficient information to code with codable children: Secondary | ICD-10-CM | POA: Diagnosis not present

## 2013-08-04 DIAGNOSIS — D1801 Hemangioma of skin and subcutaneous tissue: Secondary | ICD-10-CM | POA: Diagnosis not present

## 2013-08-04 DIAGNOSIS — Z85828 Personal history of other malignant neoplasm of skin: Secondary | ICD-10-CM | POA: Diagnosis not present

## 2013-08-04 DIAGNOSIS — L821 Other seborrheic keratosis: Secondary | ICD-10-CM | POA: Diagnosis not present

## 2013-08-04 DIAGNOSIS — L608 Other nail disorders: Secondary | ICD-10-CM | POA: Diagnosis not present

## 2013-08-04 DIAGNOSIS — L219 Seborrheic dermatitis, unspecified: Secondary | ICD-10-CM | POA: Diagnosis not present

## 2013-08-13 DIAGNOSIS — Z1211 Encounter for screening for malignant neoplasm of colon: Secondary | ICD-10-CM | POA: Diagnosis not present

## 2013-08-21 ENCOUNTER — Other Ambulatory Visit: Payer: Self-pay | Admitting: *Deleted

## 2013-08-21 ENCOUNTER — Ambulatory Visit: Payer: Medicare Other | Admitting: Cardiology

## 2013-08-21 ENCOUNTER — Ambulatory Visit (INDEPENDENT_AMBULATORY_CARE_PROVIDER_SITE_OTHER): Payer: Medicare Other | Admitting: Cardiology

## 2013-08-21 ENCOUNTER — Encounter: Payer: Self-pay | Admitting: Cardiology

## 2013-08-21 VITALS — BP 120/70 | HR 57 | Ht 67.0 in | Wt 139.0 lb

## 2013-08-21 DIAGNOSIS — E785 Hyperlipidemia, unspecified: Secondary | ICD-10-CM

## 2013-08-21 DIAGNOSIS — I10 Essential (primary) hypertension: Secondary | ICD-10-CM

## 2013-08-21 DIAGNOSIS — I359 Nonrheumatic aortic valve disorder, unspecified: Secondary | ICD-10-CM

## 2013-08-21 DIAGNOSIS — R059 Cough, unspecified: Secondary | ICD-10-CM | POA: Diagnosis not present

## 2013-08-21 DIAGNOSIS — R001 Bradycardia, unspecified: Secondary | ICD-10-CM | POA: Insufficient documentation

## 2013-08-21 DIAGNOSIS — I2510 Atherosclerotic heart disease of native coronary artery without angina pectoris: Secondary | ICD-10-CM

## 2013-08-21 DIAGNOSIS — R05 Cough: Secondary | ICD-10-CM | POA: Diagnosis not present

## 2013-08-21 DIAGNOSIS — I498 Other specified cardiac arrhythmias: Secondary | ICD-10-CM

## 2013-08-21 DIAGNOSIS — R55 Syncope and collapse: Secondary | ICD-10-CM | POA: Diagnosis not present

## 2013-08-21 DIAGNOSIS — I350 Nonrheumatic aortic (valve) stenosis: Secondary | ICD-10-CM

## 2013-08-21 NOTE — Assessment & Plan Note (Signed)
The pt has had recurrent dry, non productive cough X 2 months.

## 2013-08-21 NOTE — Progress Notes (Signed)
08/21/2013 Andrew Bonilla   [birth year]  [id number]  Primary Rockney Ghee, MD Primary Cardiologist: Dr Rennis Golden  HPI:  79 y/o s/p tissue AVR May 2013. He has had prior history of a dry, non productive cough but this seemed to have resolved at his last OV. Today he is here complaining of recurrent dry cough. He denies orthopnea or PND. He is able to exercise without problems. He denies DOE. He did have a near syncopal spell while in the shower 48 hrs ago, relieved with laying down. He says he feels his HR is "too slow" at times but can't be more specific.    Current Outpatient Prescriptions  Medication Sig Dispense Refill  . aspirin 81 MG tablet Take 81 mg by mouth daily.      Marland Kitchen atorvastatin (LIPITOR) 40 MG tablet Take 40 mg by mouth daily.      . Calcium 500 MG tablet Take 600 mg by mouth.      . cholecalciferol (VITAMIN D) 1000 UNITS tablet Take 1,000 Units by mouth daily.      Marland Kitchen esomeprazole (NEXIUM) 40 MG capsule Take 40 mg by mouth daily before breakfast.      . Lactobacillus (ACIDOPHILUS) TABS Take 1 tablet by mouth 1 day or 1 dose.       . Multiple Vitamin (MULTIVITAMIN) tablet Take 1 tablet by mouth daily.      . polyethylene glycol (MIRALAX / GLYCOLAX) packet Take 17 g by mouth daily.      . Saw Palmetto, Serenoa repens, (SAW PALMETTO PO) Take 1 tablet by mouth daily.      . tadalafil (CIALIS) 10 MG tablet Take 10 mg by mouth daily as needed. For e.d.      Marland Kitchen valsartan (DIOVAN) 80 MG tablet Take 80 mg by mouth daily.      . vitamin E 400 UNIT capsule Take 400 Units by mouth daily.       No current facility-administered medications for this visit.    No Known Allergies  History   Social History  . Marital Status: Married    Spouse Name: N/A    Number of Children: N/A  . Years of Education: N/A   Occupational History  . Psychologist    Social History Main Topics  . Smoking status: Never Smoker   . Smokeless tobacco: Not on file  . Alcohol Use: Yes     Comment:  SOCIAL   . Drug Use: No  . Sexual Activity: Yes   Other Topics Concern  . Not on file   Social History Narrative   Retired Editor, commissioning     Review of Systems: General: negative for chills, fever, night sweats or weight changes.  Cardiovascular: negative for chest pain, dyspnea on exertion, edema, orthopnea, palpitations, paroxysmal nocturnal dyspnea or shortness of breath Dermatological: negative for rash Respiratory: negative for wheezing Urologic: negative for hematuria Abdominal: negative for nausea, vomiting, diarrhea, bright red blood per rectum, melena, or hematemesis Neurologic: negative for visual changes, syncope,  All other systems reviewed and are otherwise negative except as noted above.    Blood pressure 120/70, pulse 57, height 5\' 7"  (1.702 m), weight 139 lb (63.05 kg).  General appearance: alert, cooperative, no distress and moderately obese Neck: no carotid bruit and no JVD Lungs: clear to auscultation bilaterally Heart: regular rate and rhythm and soft systolic murmur AOV Abdomen: obese Extremities: no edema Pulses: 2+ and symmetric Skin: cool and dry Neurologic: Grossly normal  EKG NSR SB  ASSESSMENT  AND PLAN:   Cough The pt has had recurrent dry, non productive cough X 2 months.  Near syncope Near syncopal episode in the shower 2 days ago  Hyperlipidemia .  Hypertension .  Bradycardia HR 56 today   PLAN : I ordered an echo and two week monitor. I did not change his medications at this time. There was no CHF on exam. He will follow up with Dr Rennis Golden after the above tests.  Nasha Diss KPA-C 08/21/2013 1:55 PM

## 2013-08-21 NOTE — Patient Instructions (Signed)
Your physician has requested that you have an echocardiogram. Echocardiography is a painless test that uses sound waves to create images of your heart. It provides your doctor with information about the size and shape of your heart and how well your heart's chambers and valves are working. This procedure takes approximately one hour. There are no restrictions for this procedure.  Your physician has recommended that you wear an event monitor. Event monitors are medical devices that record the heart's electrical activity. Doctors most often [place] these monitors to diagnose arrhythmias. Arrhythmias are problems with the speed or rhythm of the heartbeat. The monitor is a small, portable device. You can wear one while you do your normal daily activities. This is usually used to diagnose what is causing palpitations/syncope (passing out).   Your physician recommends that you schedule a follow-up appointment in: with Dr Rennis Golden after test results

## 2013-08-21 NOTE — Assessment & Plan Note (Signed)
Near syncopal episode in the shower 2 days ago

## 2013-08-21 NOTE — Assessment & Plan Note (Signed)
HR 56 today

## 2013-08-22 DIAGNOSIS — R55 Syncope and collapse: Secondary | ICD-10-CM

## 2013-08-24 ENCOUNTER — Encounter: Payer: Self-pay | Admitting: Cardiology

## 2013-09-03 DIAGNOSIS — I700 Atherosclerosis of aorta: Secondary | ICD-10-CM

## 2013-09-03 DIAGNOSIS — I779 Disorder of arteries and arterioles, unspecified: Secondary | ICD-10-CM

## 2013-09-03 HISTORY — DX: Atherosclerosis of aorta: I70.0

## 2013-09-03 HISTORY — DX: Disorder of arteries and arterioles, unspecified: I77.9

## 2013-09-08 ENCOUNTER — Ambulatory Visit (HOSPITAL_COMMUNITY)
Admission: RE | Admit: 2013-09-08 | Discharge: 2013-09-08 | Disposition: A | Discharge status: Home / Self Care | Payer: Medicare Other | Source: Ambulatory Visit | Attending: Cardiovascular Disease | Admitting: Cardiovascular Disease

## 2013-09-08 ENCOUNTER — Encounter: Payer: Self-pay | Admitting: *Deleted

## 2013-09-08 DIAGNOSIS — Z954 Presence of other heart-valve replacement: Secondary | ICD-10-CM | POA: Diagnosis not present

## 2013-09-08 DIAGNOSIS — E785 Hyperlipidemia, unspecified: Secondary | ICD-10-CM | POA: Diagnosis not present

## 2013-09-08 DIAGNOSIS — I059 Rheumatic mitral valve disease, unspecified: Secondary | ICD-10-CM | POA: Insufficient documentation

## 2013-09-08 DIAGNOSIS — R05 Cough: Secondary | ICD-10-CM | POA: Insufficient documentation

## 2013-09-08 DIAGNOSIS — R55 Syncope and collapse: Secondary | ICD-10-CM | POA: Diagnosis not present

## 2013-09-08 DIAGNOSIS — I498 Other specified cardiac arrhythmias: Secondary | ICD-10-CM | POA: Insufficient documentation

## 2013-09-08 DIAGNOSIS — I10 Essential (primary) hypertension: Secondary | ICD-10-CM | POA: Insufficient documentation

## 2013-09-08 DIAGNOSIS — R059 Cough, unspecified: Secondary | ICD-10-CM | POA: Diagnosis not present

## 2013-09-08 NOTE — Progress Notes (Signed)
2D Echo Performed 09/08/2013    Laurna Shetley, RCS  

## 2013-09-09 DIAGNOSIS — H35379 Puckering of macula, unspecified eye: Secondary | ICD-10-CM | POA: Diagnosis not present

## 2013-09-09 DIAGNOSIS — H40059 Ocular hypertension, unspecified eye: Secondary | ICD-10-CM | POA: Diagnosis not present

## 2013-09-14 ENCOUNTER — Ambulatory Visit (INDEPENDENT_AMBULATORY_CARE_PROVIDER_SITE_OTHER): Payer: Medicare Other | Admitting: Internal Medicine

## 2013-09-14 ENCOUNTER — Encounter: Payer: Self-pay | Admitting: Internal Medicine

## 2013-09-14 VITALS — BP 120/60 | HR 80 | Ht 67.0 in | Wt 236.0 lb

## 2013-09-14 DIAGNOSIS — I10 Essential (primary) hypertension: Secondary | ICD-10-CM

## 2013-09-14 DIAGNOSIS — I359 Nonrheumatic aortic valve disorder, unspecified: Secondary | ICD-10-CM | POA: Diagnosis not present

## 2013-09-14 DIAGNOSIS — R55 Syncope and collapse: Secondary | ICD-10-CM | POA: Diagnosis not present

## 2013-09-14 DIAGNOSIS — E785 Hyperlipidemia, unspecified: Secondary | ICD-10-CM

## 2013-09-14 DIAGNOSIS — I350 Nonrheumatic aortic (valve) stenosis: Secondary | ICD-10-CM

## 2013-09-14 MED ORDER — BENZONATATE 100 MG PO CAPS: 100.0000 mg | ORAL_CAPSULE | Freq: Three times a day (TID) | ORAL | Status: DC | PRN

## 2013-09-14 NOTE — Progress Notes (Signed)
OFFICE NOTE  Chief Complaint:  Follow-up presyncope  Primary Care Physician: Wenda Low, MD  HPI:  Andrew Bonilla is a 80 year old gentleman with a history of severe aortic stenosis who underwent [facility] Ease pericardial tissue valve placement in the aortic position Jan 16, 2012. Postoperatively he has done very well, had some shortness of breath and persistent cough which has improved, as well as some anemia which has also improved with iron supplementation.  Recently he had a presyncopal episode while he was in the shower. He was particularly warm shower which may play a role in it. Also has been having problems with coughing, which is paroxysmal.  He's had no further episodes since the syncopal episode but was seen by one of our physician assistants and he recommended an echocardiogram and monitor. The echocardiogram demonstrated a stable gradient across the aortic valve prosthesis with an EF of 50-55%. His monitor did not reveal any significant arrhythmias, pauses or any other findings that would explain his presyncopal episode.  PMHx:  Past Medical History  Diagnosis Date  . Hyperlipemia   . Hypertension   . Constipation   . Hard of hearing   . Heart murmur     mod-severe aortic stenosis   . Recurrent upper respiratory infection (URI)     12/2011  . Prostate enlargement   . GERD (gastroesophageal reflux disease)   . Deaf, left     WEARS HEARING AID RIGHT EAR   . S/P AVR (aortic valve replacement) 01/16/2012    [facility] Ease 67mm pericardial tissue valve    Past Surgical History  Procedure Laterality Date  . Cardiac catheterization  12/17/2011    mild nonobstructive CAD - 20% LAD stenosis, 30% OM stenosis, 20% Cfx, 20% RCA lesion  . Aortic valve replacement  01/17/2012    Procedure: AORTIC VALVE REPLACEMENT (AVR);  Surgeon: Ivin Poot, MD;  Location: [place];  Service: Open Heart Surgery;  Laterality: N/A;  . Tee without cardioversion  01/2012    EF 50-55%;  calcified AV annulus, severely calcified AV leaflets, mild regurg (AVR)    FAMHx:  Family History  Problem Relation Age of Onset  . Coronary artery disease Father 20    MI/smoker  . Colon cancer Brother   . Ovarian cancer Sister   . COPD Sister     SOCHx:   reports that he has never smoked. He has never used smokeless tobacco. He reports that he drinks about 0.5 ounces of alcohol per week. He reports that he does not use illicit drugs.  ALLERGIES:  No Known Allergies  ROS: A comprehensive review of systems was negative except for: Neurological: positive for presyncope  HOME MEDS: Current Outpatient Prescriptions  Medication Sig Dispense Refill  . acetaminophen (TYLENOL) 500 MG tablet Take 500 mg by mouth every 6 (six) hours as needed.      Marland Kitchen aspirin 81 MG tablet Take 81 mg by mouth daily.      Marland Kitchen atorvastatin (LIPITOR) 40 MG tablet Take 40 mg by mouth daily.      . cholecalciferol (VITAMIN D) 1000 UNITS tablet Take 1,000 Units by mouth daily.      . Coenzyme Q10 (CO Q 10 PO) Take by mouth daily.      Marland Kitchen esomeprazole (NEXIUM) 40 MG capsule Take 40 mg by mouth daily before breakfast.      . Lactobacillus (ACIDOPHILUS) TABS Take 1 tablet by mouth 1 day or 1 dose.       Marland Kitchen  Multiple Vitamin (MULTIVITAMIN) tablet Take 1 tablet by mouth daily.      . naproxen sodium (ANAPROX) 220 MG tablet Take 220 mg by mouth 2 (two) times daily with a meal.      . polyethylene glycol (MIRALAX / GLYCOLAX) packet Take 17 g by mouth daily.      . Saw Palmetto, Serenoa repens, (SAW PALMETTO PO) Take 1 tablet by mouth daily.      . tadalafil (CIALIS) 10 MG tablet Take 10 mg by mouth daily as needed. For e.d.      Marland Kitchen valsartan (DIOVAN) 80 MG tablet Take 80 mg by mouth daily.      . vitamin E 400 UNIT capsule Take 400 Units by mouth daily.      . benzonatate (TESSALON) 100 MG capsule Take 1 capsule (100 mg total) by mouth 3 (three) times daily as needed for cough.  60 capsule  0   No current  facility-administered medications for this visit.    LABS/IMAGING: No results found for this or any previous visit (from the past 48 hour(s)). No results found.  VITALS: BP 120/60  Pulse 80  Ht 5\' 7"  (1.702 m)  Wt 236 lb (107.049 kg)  BMI 36.95 kg/m2  EXAM: Deferred  EKG: deferred  ASSESSMENT: 1. Presyncope 2. Severe aortic stenosis status post [facility] Ease 21 mm tissue valve in 2013 3. Hypertension 4. Dyslipidemia 5. Obesity  PLAN: 1.   [name] had an episode of presyncope in the shower, which sounds quite a bit like vasovagal presyncope.  This could also be related to cough but she has paroxysmally.  Either way, did not see any worrisome findings. He did not have any chest pain or worsening shortness of breath. His echo appears stable and his monitor was unrevealing.  His blood pressure appears normal therefore I we'll not change his medicines. I've advised him how to act in case he should have another presyncopal episode. Plan to see him back in 8 or 9 months after he gets back from [place].  Pixie Casino, MD, [facility] Attending Cardiologist [facility]  Andrew Bonilla,Andrew Bonilla 09/14/2013, 11:22 AM

## 2013-09-14 NOTE — Patient Instructions (Addendum)
Your physician wants you to follow-up in October 2015. You will receive a reminder letter in the mail two months in advance. If you don't receive a letter, please call our office to schedule the follow-up appointment.  Dr. Debara Pickett has prescribed tessalon pearls to use for cough

## 2013-11-11 DIAGNOSIS — N139 Obstructive and reflux uropathy, unspecified: Secondary | ICD-10-CM | POA: Diagnosis not present

## 2013-11-11 DIAGNOSIS — N401 Enlarged prostate with lower urinary tract symptoms: Secondary | ICD-10-CM | POA: Diagnosis not present

## 2013-11-11 DIAGNOSIS — N138 Other obstructive and reflux uropathy: Secondary | ICD-10-CM | POA: Diagnosis not present

## 2013-11-11 DIAGNOSIS — N529 Male erectile dysfunction, unspecified: Secondary | ICD-10-CM | POA: Diagnosis not present

## 2013-12-23 ENCOUNTER — Encounter: Payer: Self-pay | Admitting: Cardiothoracic Surgery

## 2013-12-23 ENCOUNTER — Ambulatory Visit (INDEPENDENT_AMBULATORY_CARE_PROVIDER_SITE_OTHER): Payer: Medicare Other | Admitting: Cardiothoracic Surgery

## 2013-12-23 VITALS — BP 124/67 | HR 57 | Resp 16 | Ht 67.0 in | Wt 232.0 lb

## 2013-12-23 DIAGNOSIS — Z952 Presence of prosthetic heart valve: Secondary | ICD-10-CM

## 2013-12-23 DIAGNOSIS — I350 Nonrheumatic aortic (valve) stenosis: Secondary | ICD-10-CM

## 2013-12-23 DIAGNOSIS — Z954 Presence of other heart-valve replacement: Secondary | ICD-10-CM | POA: Diagnosis not present

## 2013-12-23 DIAGNOSIS — I359 Nonrheumatic aortic valve disorder, unspecified: Secondary | ICD-10-CM | POA: Diagnosis not present

## 2013-12-23 NOTE — Progress Notes (Signed)
PCP is Wenda Low, MD Referring Provider is Pixie Casino., MD  Chief Complaint  Patient presents with  . Follow-up    6 month f/u...s/p AVR  01/17/12...ECHO 09/08/13    HPI: Patient returns for routine 6 month followup after undergoing aortic valve replacement for severe aortic stenosis 2 years ago. I reviewed the echocardiogram performed January which demonstrated good LV function, normal functioning of the bioprosthetic aortic valve a mean gradient of 8 mmHg and no aortic insufficiency. The patient is maintained a sinus rhythm. He is followed by his cardiologist Dr. Debara Pickett. He has no complaints of heart failure angina. He is planning an extended trip to [place] next month.   Past Medical History  Diagnosis Date  . Hyperlipemia   . Hypertension   . Constipation   . Hard of hearing   . Heart murmur     mod-severe aortic stenosis   . Recurrent upper respiratory infection (URI)     12/2011  . Prostate enlargement   . GERD (gastroesophageal reflux disease)   . Deaf, left     WEARS HEARING AID RIGHT EAR   . S/P AVR (aortic valve replacement) 01/16/2012    [facility] Ease 41mm pericardial tissue valve    Past Surgical History  Procedure Laterality Date  . Cardiac catheterization  12/17/2011    mild nonobstructive CAD - 20% LAD stenosis, 30% OM stenosis, 20% Cfx, 20% RCA lesion  . Aortic valve replacement  01/17/2012    Procedure: AORTIC VALVE REPLACEMENT (AVR);  Surgeon: Ivin Poot, MD;  Location: [place];  Service: Open Heart Surgery;  Laterality: N/A;  . Tee without cardioversion  01/2012    EF 50-55%; calcified AV annulus, severely calcified AV leaflets, mild regurg (AVR)    Family History  Problem Relation Age of Onset  . Coronary artery disease Father 39    MI/smoker  . Colon cancer Brother   . Ovarian cancer Sister   . COPD Sister     Social History History  Substance Use Topics  . Smoking status: Never Smoker   . Smokeless tobacco: Never Used  . Alcohol Use:  0.5 - 1.0 oz/week    1-2 drink(s) per week     Comment: SOCIAL     Current Outpatient Prescriptions  Medication Sig Dispense Refill  . acetaminophen (TYLENOL) 500 MG tablet Take 500 mg by mouth every 6 (six) hours as needed.      Marland Kitchen aspirin 81 MG tablet Take 81 mg by mouth daily.      Marland Kitchen atorvastatin (LIPITOR) 40 MG tablet Take 40 mg by mouth daily.      . cholecalciferol (VITAMIN D) 1000 UNITS tablet Take 1,000 Units by mouth daily.      . Coenzyme Q10 (CO Q 10 PO) Take by mouth daily.      Marland Kitchen esomeprazole (NEXIUM) 40 MG capsule Take 40 mg by mouth daily before breakfast.      . Lactobacillus (ACIDOPHILUS) TABS Take 1 tablet by mouth 1 day or 1 dose.       . Multiple Vitamin (MULTIVITAMIN) tablet Take 1 tablet by mouth daily.      . polyethylene glycol (MIRALAX / GLYCOLAX) packet Take 17 g by mouth daily.      . Saw Palmetto, Serenoa repens, (SAW PALMETTO PO) Take 1 tablet by mouth daily.      . tadalafil (CIALIS) 10 MG tablet Take 10 mg by mouth daily as needed. For e.d.      Marland Kitchen valsartan (DIOVAN)  80 MG tablet Take 80 mg by mouth daily.      . vitamin E 400 UNIT capsule Take 400 Units by mouth daily.       No current facility-administered medications for this visit.    No Known Allergies  Review of Systems feeling well no complaints other than he needs to lose a few pounds  BP 124/67  Pulse 57  Resp 16  Ht 5\' 7"  (1.702 m)  Wt 232 lb (105.235 kg)  BMI 36.33 kg/m2  SpO2 97% Physical Exam Alert and comfortable HEENT normocephalic dentition good Neck without JVD mass Lungs clear bilaterally Cardiac rhythm regular murmur Abdomen obese soft nontender Ankles 1+ trace edema  Diagnostic Tests: Echocardiogram performed January of this year personally reviewed  Impression doing well 2 months after aVR for aortic stenosis. Dental hygiene i and antibiotic prophylaxis reviewed. Patient will return for routine followup in 6 months.

## 2014-01-04 DIAGNOSIS — I10 Essential (primary) hypertension: Secondary | ICD-10-CM | POA: Diagnosis not present

## 2014-01-04 DIAGNOSIS — Z6836 Body mass index (BMI) 36.0-36.9, adult: Secondary | ICD-10-CM | POA: Diagnosis not present

## 2014-01-04 DIAGNOSIS — K219 Gastro-esophageal reflux disease without esophagitis: Secondary | ICD-10-CM | POA: Diagnosis not present

## 2014-01-04 DIAGNOSIS — Z954 Presence of other heart-valve replacement: Secondary | ICD-10-CM | POA: Diagnosis not present

## 2014-01-04 DIAGNOSIS — E669 Obesity, unspecified: Secondary | ICD-10-CM | POA: Diagnosis not present

## 2014-01-04 DIAGNOSIS — E782 Mixed hyperlipidemia: Secondary | ICD-10-CM | POA: Diagnosis not present

## 2014-01-20 DIAGNOSIS — H40039 Anatomical narrow angle, unspecified eye: Secondary | ICD-10-CM | POA: Diagnosis not present

## 2014-06-18 ENCOUNTER — Other Ambulatory Visit: Payer: Self-pay

## 2014-06-23 ENCOUNTER — Encounter: Payer: Medicare Other | Admitting: Cardiothoracic Surgery

## 2014-06-24 ENCOUNTER — Telehealth: Payer: Self-pay | Admitting: *Deleted

## 2014-07-20 DIAGNOSIS — Z23 Encounter for immunization: Secondary | ICD-10-CM | POA: Diagnosis not present

## 2014-07-23 DIAGNOSIS — K219 Gastro-esophageal reflux disease without esophagitis: Secondary | ICD-10-CM | POA: Diagnosis not present

## 2014-07-23 DIAGNOSIS — N400 Enlarged prostate without lower urinary tract symptoms: Secondary | ICD-10-CM | POA: Diagnosis not present

## 2014-07-23 DIAGNOSIS — E669 Obesity, unspecified: Secondary | ICD-10-CM | POA: Diagnosis not present

## 2014-07-23 DIAGNOSIS — I10 Essential (primary) hypertension: Secondary | ICD-10-CM | POA: Diagnosis not present

## 2014-07-23 DIAGNOSIS — R7309 Other abnormal glucose: Secondary | ICD-10-CM | POA: Diagnosis not present

## 2014-07-23 DIAGNOSIS — Z952 Presence of prosthetic heart valve: Secondary | ICD-10-CM | POA: Diagnosis not present

## 2014-07-23 DIAGNOSIS — I739 Peripheral vascular disease, unspecified: Secondary | ICD-10-CM | POA: Diagnosis not present

## 2014-07-23 DIAGNOSIS — E782 Mixed hyperlipidemia: Secondary | ICD-10-CM | POA: Diagnosis not present

## 2014-07-23 DIAGNOSIS — Z1389 Encounter for screening for other disorder: Secondary | ICD-10-CM | POA: Diagnosis not present

## 2014-07-23 DIAGNOSIS — Z23 Encounter for immunization: Secondary | ICD-10-CM | POA: Diagnosis not present

## 2014-07-23 DIAGNOSIS — Z0000 Encounter for general adult medical examination without abnormal findings: Secondary | ICD-10-CM | POA: Diagnosis not present

## 2014-07-28 ENCOUNTER — Ambulatory Visit: Payer: Medicare Other | Admitting: Internal Medicine

## 2014-08-04 ENCOUNTER — Other Ambulatory Visit: Payer: Self-pay | Admitting: *Deleted

## 2014-08-04 ENCOUNTER — Encounter: Payer: Self-pay | Admitting: Cardiothoracic Surgery

## 2014-08-04 ENCOUNTER — Ambulatory Visit (INDEPENDENT_AMBULATORY_CARE_PROVIDER_SITE_OTHER): Payer: Medicare Other | Admitting: Internal Medicine

## 2014-08-04 ENCOUNTER — Encounter: Payer: Self-pay | Admitting: Internal Medicine

## 2014-08-04 ENCOUNTER — Ambulatory Visit (INDEPENDENT_AMBULATORY_CARE_PROVIDER_SITE_OTHER): Payer: Medicare Other | Admitting: Cardiothoracic Surgery

## 2014-08-04 VITALS — BP 140/80 | HR 81 | Ht 66.0 in | Wt 245.7 lb

## 2014-08-04 VITALS — BP 146/77 | HR 74 | Resp 20 | Ht 66.0 in | Wt 245.0 lb

## 2014-08-04 DIAGNOSIS — Z952 Presence of prosthetic heart valve: Secondary | ICD-10-CM

## 2014-08-04 DIAGNOSIS — L603 Nail dystrophy: Secondary | ICD-10-CM | POA: Diagnosis not present

## 2014-08-04 DIAGNOSIS — R001 Bradycardia, unspecified: Secondary | ICD-10-CM

## 2014-08-04 DIAGNOSIS — Z954 Presence of other heart-valve replacement: Secondary | ICD-10-CM

## 2014-08-04 DIAGNOSIS — I350 Nonrheumatic aortic (valve) stenosis: Secondary | ICD-10-CM

## 2014-08-04 DIAGNOSIS — I10 Essential (primary) hypertension: Secondary | ICD-10-CM | POA: Diagnosis not present

## 2014-08-04 DIAGNOSIS — E669 Obesity, unspecified: Secondary | ICD-10-CM | POA: Insufficient documentation

## 2014-08-04 DIAGNOSIS — Z85828 Personal history of other malignant neoplasm of skin: Secondary | ICD-10-CM | POA: Diagnosis not present

## 2014-08-04 DIAGNOSIS — I38 Endocarditis, valve unspecified: Secondary | ICD-10-CM

## 2014-08-04 DIAGNOSIS — D225 Melanocytic nevi of trunk: Secondary | ICD-10-CM | POA: Diagnosis not present

## 2014-08-04 DIAGNOSIS — E785 Hyperlipidemia, unspecified: Secondary | ICD-10-CM

## 2014-08-04 DIAGNOSIS — D1801 Hemangioma of skin and subcutaneous tissue: Secondary | ICD-10-CM | POA: Diagnosis not present

## 2014-08-04 MED ORDER — AMOXICILLIN 500 MG PO CAPS: 2000.0000 mg | ORAL_CAPSULE | Freq: Once | ORAL | Status: DC

## 2014-08-04 NOTE — Progress Notes (Signed)
OFFICE NOTE  Chief Complaint:  Follow-up presyncope  Primary Care Physician: Wenda Low, MD  HPI:  Andrew Bonilla is a 80 year old gentleman with a history of severe aortic stenosis who underwent [facility] Ease pericardial tissue valve placement in the aortic position Jan 16, 2012. Postoperatively he has done very well, had some shortness of breath and persistent cough which has improved, as well as some anemia which has also improved with iron supplementation.  Recently he had a presyncopal episode while he was in the shower. He was particularly warm shower which may play a role in it. Also has been having problems with coughing, which is paroxysmal.  He's had no further episodes since the syncopal episode but was seen by one of our physician assistants and he recommended an echocardiogram and monitor. The echocardiogram demonstrated a stable gradient across the aortic valve prosthesis with an EF of 50-55%. His monitor did not reveal any significant arrhythmias, pauses or any other findings that would explain his presyncopal episode.  Mr. Crisostomo returns today for follow-up. He has been doing fairly well. He was increased for the past 6 months or so and denies any chest pain or worsening shortness of breath. He's had no presyncopal episodes. His cough is gone away. He is echo as mentioned was in January 2015 and showed no significant new findings. Unfortunately he's gained about 15-20 pounds and is in need of exercise and weight loss.  PMHx:  Past Medical History  Diagnosis Date  . Hyperlipemia   . Hypertension   . Constipation   . Hard of hearing   . Heart murmur     mod-severe aortic stenosis   . Recurrent upper respiratory infection (URI)     12/2011  . Prostate enlargement   . GERD (gastroesophageal reflux disease)   . Deaf, left     WEARS HEARING AID RIGHT EAR   . S/P AVR (aortic valve replacement) 01/16/2012    [facility] Ease 44mm pericardial tissue valve    Past  Surgical History  Procedure Laterality Date  . Cardiac catheterization  12/17/2011    mild nonobstructive CAD - 20% LAD stenosis, 30% OM stenosis, 20% Cfx, 20% RCA lesion  . Aortic valve replacement  01/17/2012    Procedure: AORTIC VALVE REPLACEMENT (AVR);  Surgeon: Ivin Poot, MD;  Location: [place];  Service: Open Heart Surgery;  Laterality: N/A;  . Tee without cardioversion  01/2012    EF 50-55%; calcified AV annulus, severely calcified AV leaflets, mild regurg (AVR)    FAMHx:  Family History  Problem Relation Age of Onset  . Coronary artery disease Father 64    MI/smoker  . Colon cancer Brother   . Ovarian cancer Sister   . COPD Sister     SOCHx:   reports that he has never smoked. He has never used smokeless tobacco. He reports that he drinks about 0.5 - 1.0 oz of alcohol per week. He reports that he does not use illicit drugs.  ALLERGIES:  No Known Allergies  ROS: A comprehensive review of systems was negative except for: Neurological: positive for presyncope  HOME MEDS: Current Outpatient Prescriptions  Medication Sig Dispense Refill  . acetaminophen (TYLENOL) 500 MG tablet Take 500 mg by mouth every 6 (six) hours as needed.    Marland Kitchen aspirin 81 MG tablet Take 81 mg by mouth daily.    Marland Kitchen atorvastatin (LIPITOR) 40 MG tablet Take 40 mg by mouth daily.    . cholecalciferol (VITAMIN D)  1000 UNITS tablet Take 1,000 Units by mouth daily.    . Coenzyme Q10 (CO Q 10 PO) Take by mouth daily.    Marland Kitchen esomeprazole (NEXIUM) 40 MG capsule Take 40 mg by mouth daily before breakfast.    . Lactobacillus (ACIDOPHILUS) TABS Take 1 tablet by mouth 1 day or 1 dose.     . Multiple Vitamin (MULTIVITAMIN) tablet Take 1 tablet by mouth daily.    . polyethylene glycol (MIRALAX / GLYCOLAX) packet Take 17 g by mouth daily.    . Saw Palmetto, Serenoa repens, (SAW PALMETTO PO) Take 1 tablet by mouth daily.    . tadalafil (CIALIS) 10 MG tablet Take 10 mg by mouth daily as needed. For e.d.    Marland Kitchen valsartan  (DIOVAN) 80 MG tablet Take 80 mg by mouth daily.    . vitamin E 400 UNIT capsule Take 400 Units by mouth daily.     No current facility-administered medications for this visit.    LABS/IMAGING: No results found for this or any previous visit (from the past 48 hour(s)). No results found.  VITALS: BP 140/80 mmHg  Pulse 81  Ht 5\' 6"  (1.676 m)  Wt 245 lb 11.2 oz (111.449 kg)  BMI 39.68 kg/m2  EXAM: GEN: Awake, NAD HEENT: PERRLA, EOMI Lungs: Clear bilaterally Cardiovascular: Regular rate and rhythm, NL S1/S2, no M/R/G's Abdomen: Obese, soft, nontender, positive BS Extremities: Trace bilateral sock line edema Neurologic: Grossly nonfocal Psychiatric: Normal mood and affect  EKG: Normal sinus rhythm at 81  ASSESSMENT: 1. Presyncope - resolved 2. Severe aortic stenosis status post [facility] Ease 21 mm tissue valve in 2013 3. Hypertension - upper normal 4. Dyslipidemia 5. Obesity - recent 15 lb weight gain  PLAN: 1.   Mr. Bontempo has not had any further presyncopal episodes. His cough has resolved. His blood pressure is upper normal today probably related to his 15 pound weight gain. He does have a trace amount of leg edema. He is borderline for considering adding additional blood pressure medication, such as low-dose hydrochlorothiazide in addition to his Diovan. I will hold off on that and give him an opportunity to lose weight, but it is not successful we may need to consider this. Plan to see him back in 1 year with an echo prior to the visit.   Pixie Casino, MD, [facility] Attending Cardiologist [facility]  HILTY,Kenneth C 08/04/2014, 9:48 AM

## 2014-08-04 NOTE — Progress Notes (Signed)
PCP is Wenda Low, MD Referring Provider is Pixie Casino., MD  Chief Complaint  Patient presents with  . Follow-up    6 month f/u s/p AVR  . Routine Post Op    HPI: Patient returns for routine scheduled 6 month followup The patient had aVR with a biologic prosthesis 2.5 years ago for aortic stenosis. His last echocardiogram performed earlier this year is reviewed showing normal valve function, normal LV function. The patient has maintained sinus rhythm. The patient has not had any more presyncopal episodes. The patient has gained weight these past 6 months when he was traveling extensively. The patient denies symptoms of heart failure or palpitation. The patient has a dental cleaning scheduled in the next 6 weeks and will take amoxicillin prophylaxis.  Past Medical History  Diagnosis Date  . Hyperlipemia   . Hypertension   . Constipation   . Hard of hearing   . Heart murmur     mod-severe aortic stenosis   . Recurrent upper respiratory infection (URI)     12/2011  . Prostate enlargement   . GERD (gastroesophageal reflux disease)   . Deaf, left     WEARS HEARING AID RIGHT EAR   . S/P AVR (aortic valve replacement) 01/16/2012    [facility] Ease 24mm pericardial tissue valve    Past Surgical History  Procedure Laterality Date  . Cardiac catheterization  12/17/2011    mild nonobstructive CAD - 20% LAD stenosis, 30% OM stenosis, 20% Cfx, 20% RCA lesion  . Aortic valve replacement  01/17/2012    Procedure: AORTIC VALVE REPLACEMENT (AVR);  Surgeon: Ivin Poot, MD;  Location: [place];  Service: Open Heart Surgery;  Laterality: N/A;  . Tee without cardioversion  01/2012    EF 50-55%; calcified AV annulus, severely calcified AV leaflets, mild regurg (AVR)    Family History  Problem Relation Age of Onset  . Coronary artery disease Father 52    MI/smoker  . Colon cancer Brother   . Ovarian cancer Sister   . COPD Sister     Social History History  Substance Use Topics   . Smoking status: Never Smoker   . Smokeless tobacco: Never Used  . Alcohol Use: 0.5 - 1.0 oz/week    1-2 drink(s) per week     Comment: SOCIAL     Current Outpatient Prescriptions  Medication Sig Dispense Refill  . acetaminophen (TYLENOL) 500 MG tablet Take 500 mg by mouth every 6 (six) hours as needed.    Marland Kitchen aspirin 81 MG tablet Take 81 mg by mouth daily.    Marland Kitchen atorvastatin (LIPITOR) 40 MG tablet Take 40 mg by mouth daily.    . cholecalciferol (VITAMIN D) 1000 UNITS tablet Take 1,000 Units by mouth daily.    . Coenzyme Q10 (CO Q 10 PO) Take by mouth daily.    Marland Kitchen esomeprazole (NEXIUM) 40 MG capsule Take 40 mg by mouth daily before breakfast.    . Lactobacillus (ACIDOPHILUS) TABS Take 1 tablet by mouth 1 day or 1 dose.     . Multiple Vitamin (MULTIVITAMIN) tablet Take 1 tablet by mouth daily.    . Saw Palmetto, Serenoa repens, (SAW PALMETTO PO) Take 1 tablet by mouth daily.    . tadalafil (CIALIS) 10 MG tablet Take 10 mg by mouth daily as needed. For e.d.    Marland Kitchen valsartan (DIOVAN) 80 MG tablet Take 80 mg by mouth daily.    . vitamin E 400 UNIT capsule Take 400 Units by mouth  daily.    . amoxicillin (AMOXIL) 500 MG capsule Take 4 capsules (2,000 mg total) by mouth once. TAKE 2 HOURS BEFORE ANY DENTAL PROCEDURE 4 capsule 4   No current facility-administered medications for this visit.    No Known Allergies  Review of Systemsweight gain but no other problems  BP 146/77 mmHg  Pulse 74  Resp 20  Ht 5\' 6"  (1.676 m)  Wt 245 lb (111.131 kg)  BMI 39.56 kg/m2  SpO2 98% Physical Exam Alert and comfortable Lungs clear Heart rhythm regular Aortic valve without murmur of AI Trace pedal edema  Diagnostic Tests: Echocardiogram from January 2015 again  reviewed showing normal valvular function  Impression: Doing well 2-1/2 years after aVR for aortic stenosis. No signs of valvular deterioration. No signs of heart failure.   Plan:return for routine followup in 6 months.

## 2014-08-04 NOTE — Patient Instructions (Signed)
Dr.Hilty wants you to follow-up in: ONE YEAR. You will receive a reminder letter in the mail two months in advance. If you don't receive a letter, please call our office to schedule the follow-up appointment.  Your physician has requested that you have an echocardiogram. Echocardiography is a painless test that uses sound waves to create images of your heart. It provides your doctor with information about the size and shape of your heart and how well your heart's chambers and valves are working. This procedure takes approximately one hour. There are no restrictions for this procedure. This will be done next year before your follow up appointment.

## 2014-08-12 ENCOUNTER — Encounter (HOSPITAL_COMMUNITY): Payer: Self-pay | Admitting: Internal Medicine

## 2014-09-10 DIAGNOSIS — M1712 Unilateral primary osteoarthritis, left knee: Secondary | ICD-10-CM | POA: Diagnosis not present

## 2014-09-10 DIAGNOSIS — M1711 Unilateral primary osteoarthritis, right knee: Secondary | ICD-10-CM | POA: Diagnosis not present

## 2014-09-16 DIAGNOSIS — M1711 Unilateral primary osteoarthritis, right knee: Secondary | ICD-10-CM | POA: Diagnosis not present

## 2014-09-16 DIAGNOSIS — M170 Bilateral primary osteoarthritis of knee: Secondary | ICD-10-CM | POA: Diagnosis not present

## 2014-09-16 DIAGNOSIS — M1712 Unilateral primary osteoarthritis, left knee: Secondary | ICD-10-CM | POA: Diagnosis not present

## 2014-09-21 DIAGNOSIS — M170 Bilateral primary osteoarthritis of knee: Secondary | ICD-10-CM | POA: Diagnosis not present

## 2014-09-23 DIAGNOSIS — M170 Bilateral primary osteoarthritis of knee: Secondary | ICD-10-CM | POA: Diagnosis not present

## 2014-09-29 DIAGNOSIS — M170 Bilateral primary osteoarthritis of knee: Secondary | ICD-10-CM | POA: Diagnosis not present

## 2014-11-24 DIAGNOSIS — R351 Nocturia: Secondary | ICD-10-CM | POA: Diagnosis not present

## 2014-11-24 DIAGNOSIS — N401 Enlarged prostate with lower urinary tract symptoms: Secondary | ICD-10-CM | POA: Diagnosis not present

## 2014-11-24 DIAGNOSIS — N5201 Erectile dysfunction due to arterial insufficiency: Secondary | ICD-10-CM | POA: Diagnosis not present

## 2015-01-12 DIAGNOSIS — Z952 Presence of prosthetic heart valve: Secondary | ICD-10-CM | POA: Diagnosis not present

## 2015-01-12 DIAGNOSIS — K219 Gastro-esophageal reflux disease without esophagitis: Secondary | ICD-10-CM | POA: Diagnosis not present

## 2015-01-12 DIAGNOSIS — E782 Mixed hyperlipidemia: Secondary | ICD-10-CM | POA: Diagnosis not present

## 2015-01-12 DIAGNOSIS — I10 Essential (primary) hypertension: Secondary | ICD-10-CM | POA: Diagnosis not present

## 2015-01-12 DIAGNOSIS — I739 Peripheral vascular disease, unspecified: Secondary | ICD-10-CM | POA: Diagnosis not present

## 2015-01-24 DIAGNOSIS — L237 Allergic contact dermatitis due to plants, except food: Secondary | ICD-10-CM | POA: Diagnosis not present

## 2015-01-25 ENCOUNTER — Ambulatory Visit (INDEPENDENT_AMBULATORY_CARE_PROVIDER_SITE_OTHER): Payer: Medicare Other | Admitting: Cardiothoracic Surgery

## 2015-01-25 ENCOUNTER — Encounter: Payer: Self-pay | Admitting: Cardiothoracic Surgery

## 2015-01-25 VITALS — BP 136/74 | HR 67 | Resp 16 | Ht 66.0 in | Wt 233.8 lb

## 2015-01-25 DIAGNOSIS — Z952 Presence of prosthetic heart valve: Secondary | ICD-10-CM

## 2015-01-25 DIAGNOSIS — Z954 Presence of other heart-valve replacement: Secondary | ICD-10-CM

## 2015-01-25 DIAGNOSIS — I350 Nonrheumatic aortic (valve) stenosis: Secondary | ICD-10-CM

## 2015-01-25 NOTE — Progress Notes (Signed)
PCP is Wenda Low, MD Referring Provider is Pixie Casino, MD  Chief Complaint  Patient presents with  . Routine Post Op    6 month f/u s/p AVR 01/17/12    HPI: The patient returns for followup 3 years after aVR for severe aortic stenosis with bioprosthetic valve. He remains in sinus rhythm. He has had no symptoms of lightheadedness or presyncope, shortness of breath or chest pain. He has been able to lose 10 pounds in the last 6 months. His blood pressure is well-controlled. She is planning a trip to [place]  next month and will return in the fall.  Past Medical History  Diagnosis Date  . Hyperlipemia   . Hypertension   . Constipation   . Hard of hearing   . Heart murmur     mod-severe aortic stenosis   . Recurrent upper respiratory infection (URI)     12/2011  . Prostate enlargement   . GERD (gastroesophageal reflux disease)   . Deaf, left     WEARS HEARING AID RIGHT EAR   . S/P AVR (aortic valve replacement) 01/16/2012    [facility] Ease 50mm pericardial tissue valve    Past Surgical History  Procedure Laterality Date  . Cardiac catheterization  12/17/2011    mild nonobstructive CAD - 20% LAD stenosis, 30% OM stenosis, 20% Cfx, 20% RCA lesion  . Aortic valve replacement  01/17/2012    Procedure: AORTIC VALVE REPLACEMENT (AVR);  Surgeon: Ivin Poot, MD;  Location: [place];  Service: Open Heart Surgery;  Laterality: N/A;  . Tee without cardioversion  01/2012    EF 50-55%; calcified AV annulus, severely calcified AV leaflets, mild regurg (AVR)  . Left heart catheterization with coronary angiogram N/A 12/17/2011    Procedure: LEFT HEART CATHETERIZATION WITH CORONARY ANGIOGRAM;  Surgeon: Pixie Casino, MD;  Location: [facility] CATH LAB;  Service: Cardiovascular;  Laterality: N/A;    Family History  Problem Relation Age of Onset  . Coronary artery disease Father 41    MI/smoker  . Colon cancer Brother   . Ovarian cancer Sister   . COPD Sister     Social  History History  Substance Use Topics  . Smoking status: Never Smoker   . Smokeless tobacco: Never Used  . Alcohol Use: 0.5 - 1.0 oz/week    1-2 drink(s) per week     Comment: SOCIAL     Current Outpatient Prescriptions  Medication Sig Dispense Refill  . amoxicillin (AMOXIL) 500 MG capsule Take 4 capsules (2,000 mg total) by mouth once. TAKE 2 HOURS BEFORE ANY DENTAL PROCEDURE 4 capsule 4  . aspirin 81 MG tablet Take 81 mg by mouth daily.    Marland Kitchen atorvastatin (LIPITOR) 40 MG tablet Take 40 mg by mouth daily.    . beta carotene 30 MG capsule Take 30 mg by mouth daily.    . cholecalciferol (VITAMIN D) 1000 UNITS tablet Take 1,000 Units by mouth daily.    . Coenzyme Q10 (CO Q 10 PO) Take by mouth daily.    Marland Kitchen esomeprazole (NEXIUM) 40 MG capsule Take 40 mg by mouth daily before breakfast.    . glucosamine-chondroitin 500-400 MG tablet Take 1 tablet by mouth 2 (two) times daily.    . Lactobacillus (ACIDOPHILUS) TABS Take 1 tablet by mouth 1 day or 1 dose.     . Multiple Vitamin (MULTIVITAMIN) tablet Take 1 tablet by mouth daily.    . Saw Palmetto, Serenoa repens, (SAW PALMETTO PO) Take 1 tablet  by mouth daily.    Marland Kitchen Specialty Vitamins Products (PROSTATE PO) Take 1 tablet by mouth daily.    . tadalafil (CIALIS) 10 MG tablet Take 10 mg by mouth daily as needed. For e.d.    Marland Kitchen valsartan (DIOVAN) 80 MG tablet Take 80 mg by mouth daily.    . vitamin E 400 UNIT capsule Take 400 Units by mouth daily.     No current facility-administered medications for this visit.    No Known Allergies  Review of Systems  Remains active using a stationary bicycle and treadmill Having some left knee pain-evaluated by Dr.Alusio is not taking chondroitin sulfate  BP 136/74 mmHg  Pulse 67  Resp 16  Ht 5\' 6"  (1.676 m)  Wt 233 lb 12.8 oz (106.051 kg)  BMI 37.75 kg/m2  SpO2 97% Physical Exam Alert and comfortable Neck without JVD, supple Lungs clear Heart rate regular No murmur Extremities without  edema Neuro without focal motor deficit  Diagnostic Tests: No echocardiogram at this time  Impression: Continues to do well now 3 years after aVR for aortic stenosis. Continue with gradual weight loss goal 10 pounds in the next 6 months Continue current medications  Plan:return for reevaluation in 6 months   Len Childs, MD [facility] [phone]

## 2015-02-09 ENCOUNTER — Ambulatory Visit: Payer: Medicare Other | Admitting: Cardiothoracic Surgery

## 2015-02-28 ENCOUNTER — Other Ambulatory Visit: Payer: Self-pay

## 2015-07-18 DIAGNOSIS — K219 Gastro-esophageal reflux disease without esophagitis: Secondary | ICD-10-CM | POA: Diagnosis not present

## 2015-07-18 DIAGNOSIS — I739 Peripheral vascular disease, unspecified: Secondary | ICD-10-CM | POA: Diagnosis not present

## 2015-07-18 DIAGNOSIS — Z1389 Encounter for screening for other disorder: Secondary | ICD-10-CM | POA: Diagnosis not present

## 2015-07-18 DIAGNOSIS — R7309 Other abnormal glucose: Secondary | ICD-10-CM | POA: Diagnosis not present

## 2015-07-18 DIAGNOSIS — I10 Essential (primary) hypertension: Secondary | ICD-10-CM | POA: Diagnosis not present

## 2015-07-18 DIAGNOSIS — N400 Enlarged prostate without lower urinary tract symptoms: Secondary | ICD-10-CM | POA: Diagnosis not present

## 2015-07-18 DIAGNOSIS — E871 Hypo-osmolality and hyponatremia: Secondary | ICD-10-CM | POA: Diagnosis not present

## 2015-07-18 DIAGNOSIS — E782 Mixed hyperlipidemia: Secondary | ICD-10-CM | POA: Diagnosis not present

## 2015-07-18 DIAGNOSIS — I779 Disorder of arteries and arterioles, unspecified: Secondary | ICD-10-CM | POA: Diagnosis not present

## 2015-07-18 DIAGNOSIS — Z0000 Encounter for general adult medical examination without abnormal findings: Secondary | ICD-10-CM | POA: Diagnosis not present

## 2015-07-18 DIAGNOSIS — Z23 Encounter for immunization: Secondary | ICD-10-CM | POA: Diagnosis not present

## 2015-07-18 DIAGNOSIS — Z952 Presence of prosthetic heart valve: Secondary | ICD-10-CM | POA: Diagnosis not present

## 2015-08-02 ENCOUNTER — Other Ambulatory Visit: Payer: Self-pay | Admitting: Cardiothoracic Surgery

## 2015-08-02 DIAGNOSIS — J9383 Other pneumothorax: Secondary | ICD-10-CM

## 2015-08-02 DIAGNOSIS — S270XXA Traumatic pneumothorax, initial encounter: Secondary | ICD-10-CM

## 2015-08-03 ENCOUNTER — Other Ambulatory Visit: Payer: Self-pay | Admitting: *Deleted

## 2015-08-03 ENCOUNTER — Encounter: Payer: Self-pay | Admitting: Cardiothoracic Surgery

## 2015-08-03 ENCOUNTER — Ambulatory Visit (INDEPENDENT_AMBULATORY_CARE_PROVIDER_SITE_OTHER): Payer: Medicare Other | Admitting: Cardiothoracic Surgery

## 2015-08-03 VITALS — BP 130/76 | HR 85 | Resp 20 | Ht 66.0 in | Wt 240.0 lb

## 2015-08-03 DIAGNOSIS — Z954 Presence of other heart-valve replacement: Secondary | ICD-10-CM

## 2015-08-03 DIAGNOSIS — Z952 Presence of prosthetic heart valve: Secondary | ICD-10-CM

## 2015-08-03 DIAGNOSIS — I350 Nonrheumatic aortic (valve) stenosis: Secondary | ICD-10-CM

## 2015-08-03 NOTE — Progress Notes (Signed)
PCP is Wenda Low, MD Referring Provider is Pixie Casino, MD  Chief Complaint  Patient presents with  . Follow-up    6 month f/u s/p 3 years AVR    HPI: Routine biannual follow-up after aVR for aortic stenosis and history of presyncope 2013. Last echocardiogram January 2016 shows good LV function and in significant transvalvular gradient. The patient continues to do well without recurrent symptoms of presyncope. He does complain of some decreased exercise tolerance and shortness of breath at age 50 and obese. Past Medical History  Diagnosis Date  . Hyperlipemia   . Hypertension   . Constipation   . Hard of hearing   . Heart murmur     mod-severe aortic stenosis   . Recurrent upper respiratory infection (URI)     12/2011  . Prostate enlargement   . GERD (gastroesophageal reflux disease)   . Deaf, left     WEARS HEARING AID RIGHT EAR   . S/P AVR (aortic valve replacement) 01/16/2012    [facility] Ease 35mm pericardial tissue valve    Past Surgical History  Procedure Laterality Date  . Cardiac catheterization  12/17/2011    mild nonobstructive CAD - 20% LAD stenosis, 30% OM stenosis, 20% Cfx, 20% RCA lesion  . Aortic valve replacement  01/17/2012    Procedure: AORTIC VALVE REPLACEMENT (AVR);  Surgeon: Ivin Poot, MD;  Location: [place];  Service: Open Heart Surgery;  Laterality: N/A;  . Tee without cardioversion  01/2012    EF 50-55%; calcified AV annulus, severely calcified AV leaflets, mild regurg (AVR)  . Left heart catheterization with coronary angiogram N/A 12/17/2011    Procedure: LEFT HEART CATHETERIZATION WITH CORONARY ANGIOGRAM;  Surgeon: Pixie Casino, MD;  Location: [facility] CATH LAB;  Service: Cardiovascular;  Laterality: N/A;    Family History  Problem Relation Age of Onset  . Coronary artery disease Father 62    MI/smoker  . Colon cancer Brother   . Ovarian cancer Sister   . COPD Sister     Social History Social History  Substance Use Topics  .  Smoking status: Never Smoker   . Smokeless tobacco: Never Used  . Alcohol Use: 0.5 - 1.0 oz/week    1-2 drink(s) per week     Comment: SOCIAL     Current Outpatient Prescriptions  Medication Sig Dispense Refill  . amoxicillin (AMOXIL) 500 MG capsule Take 4 capsules (2,000 mg total) by mouth once. TAKE 2 HOURS BEFORE ANY DENTAL PROCEDURE 4 capsule 4  . aspirin 81 MG tablet Take 81 mg by mouth daily.    Marland Kitchen atorvastatin (LIPITOR) 40 MG tablet Take 40 mg by mouth daily.    . beta carotene 30 MG capsule Take 30 mg by mouth daily.    . cholecalciferol (VITAMIN D) 1000 UNITS tablet Take 1,000 Units by mouth daily.    . Coenzyme Q10 (CO Q 10 PO) Take by mouth daily.    Marland Kitchen esomeprazole (NEXIUM) 40 MG capsule Take 40 mg by mouth daily before breakfast.    . glucosamine-chondroitin 500-400 MG tablet Take 1 tablet by mouth 2 (two) times daily.    . Lactobacillus (ACIDOPHILUS) TABS Take 1 tablet by mouth 1 day or 1 dose.     . Multiple Vitamin (MULTIVITAMIN) tablet Take 1 tablet by mouth daily.    . Saw Palmetto, Serenoa repens, (SAW PALMETTO PO) Take 1 tablet by mouth daily.    Marland Kitchen Specialty Vitamins Products (PROSTATE PO) Take 1 tablet by mouth daily.    Marland Kitchen  tadalafil (CIALIS) 10 MG tablet Take 10 mg by mouth daily as needed. For e.d.    Marland Kitchen valsartan (DIOVAN) 80 MG tablet Take 80 mg by mouth daily.    . vitamin E 400 UNIT capsule Take 400 Units by mouth daily.     No current facility-administered medications for this visit.    No Known Allergies  Review of Systems   No weight loss or fever No change in vision or dizziness No active dental complaints No chest pain Positive for dyspnea on exertion, mild No pedal edema No abdominal pain No palpitations  BP 130/76 mmHg  Pulse 85  Resp 20  Ht 5\' 6"  (1.676 m)  Wt 240 lb (108.863 kg)  BMI 38.76 kg/m2  SpO2 98% Physical Exam     Physical Exam  General: Pleasant alert and oriented HEENT: Normocephalic pupils equal , dentition  adequate Neck: Supple without JVD, adenopathy, or bruit Chest: Clear to auscultation, symmetrical breath sounds, no rhonchi, no tenderness             or deformity Cardiovascular: Regular rate and rhythm, no murmur, no gallop, peripheral pulses             palpable in all extremities Abdomen:  Soft, nontender, no palpable mass or organomegaly Extremities: Warm, well-perfused, no clubbing cyanosis edema or tenderness,              no venous stasis changes of the legs Rectal/GU: Deferred Neuro: Grossly non--focal and symmetrical throughout Skin: Clean and dry without rash or ulceration   Diagnostic Tests: No labs on this visit  Impression: The patient continues to do well after aortic valve replacement with a bioprosthetic valve 3-1/2 years ago. He'll return for a scheduled follow-up in 6 months at which time we will repeat his echocardiogram. Encouraged to increase walking and weight loss  Routine biannual follow-up Plan:   Len Childs, MD [facility] [phone]

## 2015-08-15 DIAGNOSIS — D1801 Hemangioma of skin and subcutaneous tissue: Secondary | ICD-10-CM | POA: Diagnosis not present

## 2015-08-15 DIAGNOSIS — Z85828 Personal history of other malignant neoplasm of skin: Secondary | ICD-10-CM | POA: Diagnosis not present

## 2015-08-15 DIAGNOSIS — L570 Actinic keratosis: Secondary | ICD-10-CM | POA: Diagnosis not present

## 2015-08-15 DIAGNOSIS — L821 Other seborrheic keratosis: Secondary | ICD-10-CM | POA: Diagnosis not present

## 2015-09-13 ENCOUNTER — Ambulatory Visit (INDEPENDENT_AMBULATORY_CARE_PROVIDER_SITE_OTHER): Payer: 59 | Admitting: Internal Medicine

## 2015-09-13 ENCOUNTER — Encounter: Payer: Self-pay | Admitting: Internal Medicine

## 2015-09-13 VITALS — BP 116/68 | HR 54 | Ht 67.0 in | Wt 240.3 lb

## 2015-09-13 DIAGNOSIS — R0789 Other chest pain: Secondary | ICD-10-CM

## 2015-09-13 DIAGNOSIS — Z954 Presence of other heart-valve replacement: Secondary | ICD-10-CM | POA: Diagnosis not present

## 2015-09-13 DIAGNOSIS — I10 Essential (primary) hypertension: Secondary | ICD-10-CM

## 2015-09-13 DIAGNOSIS — E785 Hyperlipidemia, unspecified: Secondary | ICD-10-CM

## 2015-09-13 DIAGNOSIS — I350 Nonrheumatic aortic (valve) stenosis: Secondary | ICD-10-CM

## 2015-09-13 DIAGNOSIS — Z952 Presence of prosthetic heart valve: Secondary | ICD-10-CM

## 2015-09-13 NOTE — Patient Instructions (Signed)
Medication Instructions:  Your physician recommends that you continue on your current medications as directed. Please refer to the Current Medication list given to you today.   Labwork: none  Testing/Procedures: Your physician has requested that you have an echocardiogram. Echocardiography is a painless test that uses sound waves to create images of your heart. It provides your doctor with information about the size and shape of your heart and how well your heart's chambers and valves are working. This procedure takes approximately one hour. There are no restrictions for this procedure.   Follow-Up: Your physician wants you to follow-up in: 12 months with Dr. Debara Pickett. You will receive a reminder letter in the mail two months in advance. If you don't receive a letter, please call our office to schedule the follow-up appointment.   Any Other Special Instructions Will Be Listed Below (If Applicable).     If you need a refill on your cardiac medications before your next appointment, please call your pharmacy.

## 2015-09-13 NOTE — Progress Notes (Signed)
OFFICE NOTE  Chief Complaint:  Chest pain  Primary Care Physician: Wenda Low, MD  HPI:  Andrew Bonilla is a 82 year old gentleman with a history of severe aortic stenosis who underwent [facility] Ease pericardial tissue valve placement in the aortic position Jan 16, 2012. Postoperatively he has done very well, had some shortness of breath and persistent cough which has improved, as well as some anemia which has also improved with iron supplementation.  Recently he had a presyncopal episode while he was in the shower. He was particularly warm shower which may play a role in it. Also has been having problems with coughing, which is paroxysmal.  He's had no further episodes since the syncopal episode but was seen by one of our physician assistants and he recommended an echocardiogram and monitor. The echocardiogram demonstrated a stable gradient across the aortic valve prosthesis with an EF of 50-55%. His monitor did not reveal any significant arrhythmias, pauses or any other findings that would explain his presyncopal episode.  Andrew Bonilla returns today for follow-up. He has been doing fairly well. He was increased for the past 6 months or so and denies any chest pain or worsening shortness of breath. He's had no presyncopal episodes. His cough is gone away. He is echo as mentioned was in January 2015 and showed no significant new findings. Unfortunately he's gained about 15-20 pounds and is in need of exercise and weight loss.  Andrew Bonilla has done well over the past year. He's had no further presyncopal episodes. He does get some shortness of breath when walking up hills. Recently he had some left chest discomfort. He reports however that it was tender to palpation and worse after lifting some heavy furniture. He does not note any worsening heaviness in his chest with exertion or particularly walking up steps. Review of recent lab work shows excellent cholesterol control.  PMHx:  Past  Medical History  Diagnosis Date  . Hyperlipemia   . Hypertension   . Constipation   . Hard of hearing   . Heart murmur     mod-severe aortic stenosis   . Recurrent upper respiratory infection (URI)     12/2011  . Prostate enlargement   . GERD (gastroesophageal reflux disease)   . Deaf, left     WEARS HEARING AID RIGHT EAR   . S/P AVR (aortic valve replacement) 01/16/2012    [facility] Ease 72mm pericardial tissue valve    Past Surgical History  Procedure Laterality Date  . Cardiac catheterization  12/17/2011    mild nonobstructive CAD - 20% LAD stenosis, 30% OM stenosis, 20% Cfx, 20% RCA lesion  . Aortic valve replacement  01/17/2012    Procedure: AORTIC VALVE REPLACEMENT (AVR);  Surgeon: Ivin Poot, MD;  Location: [place];  Service: Open Heart Surgery;  Laterality: N/A;  . Tee without cardioversion  01/2012    EF 50-55%; calcified AV annulus, severely calcified AV leaflets, mild regurg (AVR)  . Left heart catheterization with coronary angiogram N/A 12/17/2011    Procedure: LEFT HEART CATHETERIZATION WITH CORONARY ANGIOGRAM;  Surgeon: Pixie Casino, MD;  Location: [facility] CATH LAB;  Service: Cardiovascular;  Laterality: N/A;    FAMHx:  Family History  Problem Relation Age of Onset  . Coronary artery disease Father 56    MI/smoker  . Colon cancer Brother   . Ovarian cancer Sister   . COPD Sister     SOCHx:   reports that he has never smoked. He has  never used smokeless tobacco. He reports that he drinks about 0.5 - 1.0 oz of alcohol per week. He reports that he does not use illicit drugs.  ALLERGIES:  No Known Allergies  ROS: A comprehensive review of systems was negative except for: Cardiovascular: positive for chest pain  HOME MEDS: Current Outpatient Prescriptions  Medication Sig Dispense Refill  . amoxicillin (AMOXIL) 500 MG capsule Take 4 capsules (2,000 mg total) by mouth once. TAKE 2 HOURS BEFORE ANY DENTAL PROCEDURE 4 capsule 4  . Ascorbic Acid (VITAMIN C PO)  Take 1 tablet by mouth daily.    Marland Kitchen aspirin 81 MG tablet Take 81 mg by mouth daily.    Marland Kitchen atorvastatin (LIPITOR) 40 MG tablet Take 40 mg by mouth daily.    . beta carotene 30 MG capsule Take 30 mg by mouth daily.    . cholecalciferol (VITAMIN D) 1000 UNITS tablet Take 1,000 Units by mouth daily.    . Coenzyme Q10 (CO Q 10 PO) Take by mouth daily.    Marland Kitchen esomeprazole (NEXIUM) 40 MG capsule Take 40 mg by mouth daily before breakfast.    . glucosamine-chondroitin 500-400 MG tablet Take 1 tablet by mouth 2 (two) times daily.    . Lactobacillus (ACIDOPHILUS) TABS Take 1 tablet by mouth 1 day or 1 dose.     . Multiple Vitamin (MULTIVITAMIN) tablet Take 1 tablet by mouth daily.    . Multiple Vitamins-Minerals (ZINC PO) Take 1 tablet by mouth daily.    . Omega-3 Fatty Acids (OMEGA 3 PO) Take 1 capsule by mouth daily.    . Saw Palmetto, Serenoa repens, (SAW PALMETTO PO) Take 1 tablet by mouth daily.    Marland Kitchen Specialty Vitamins Products (PROSTATE PO) Take 1 tablet by mouth daily.    . tadalafil (CIALIS) 10 MG tablet Take 10 mg by mouth daily as needed. For e.d.    Marland Kitchen valsartan (DIOVAN) 80 MG tablet Take 80 mg by mouth daily.    . vitamin E 400 UNIT capsule Take 400 Units by mouth daily.     No current facility-administered medications for this visit.    LABS/IMAGING: No results found for this or any previous visit (from the past 48 hour(s)). No results found.  VITALS: BP 116/68 mmHg  Pulse 54  Ht 5\' 7"  (1.702 m)  Wt 240 lb 4.8 oz (108.999 kg)  BMI 37.63 kg/m2  EXAM: GEN: Awake, NAD HEENT: PERRLA, EOMI Lungs: Clear bilaterally Cardiovascular: Regular rate and rhythm, NL S1/S2, no M/R/G's Abdomen: Obese, soft, nontender, positive BS Extremities: Trace bilateral sock line edema Neurologic: Grossly nonfocal Psychiatric: Normal mood and affect  EKG: Sinus bradycardia 54  ASSESSMENT: 1. Presyncope - resolved 2. Severe aortic stenosis status post [facility] Ease 21 mm tissue valve in  2013 3. Hypertension - upper normal 4. Dyslipidemia 5. Obesity  PLAN: 1.   Andrew Bonilla seems to be doing well without any new concerning symptoms. Although he has some chest discomfort it is reproducible on exam and likely musculoskeletal. He reports he has been lifting some heavy furniture recently. He did have some mild nonobstructive coronary disease by cath in 2013. Valve sounds normal today without any stenosis. He is due for repeat echocardiogram which was ordered twice but never obtained. We'll go ahead and schedule that today. Blood pressure is well controlled. Unfortunately his weight is still up and he needs to work on weight loss. He is getting close to morbid obesity. I recommended more exercise and walking as well as  decrease calorie intake to try to improve his weight over the next year.  Plan follow-up annually or sooner as necessary.   Pixie Casino, MD, [facility] Attending Cardiologist [place] [address] Sven Pinheiro 09/13/2015, 5:45 PM

## 2015-09-23 ENCOUNTER — Other Ambulatory Visit: Payer: Self-pay | Admitting: Cardiothoracic Surgery

## 2015-09-27 ENCOUNTER — Other Ambulatory Visit: Payer: Self-pay

## 2015-09-27 ENCOUNTER — Other Ambulatory Visit: Payer: Self-pay | Admitting: Internal Medicine

## 2015-09-27 ENCOUNTER — Ambulatory Visit (HOSPITAL_COMMUNITY): Payer: Medicare Other | Attending: Cardiovascular Disease

## 2015-09-27 DIAGNOSIS — I071 Rheumatic tricuspid insufficiency: Secondary | ICD-10-CM | POA: Insufficient documentation

## 2015-09-27 DIAGNOSIS — E785 Hyperlipidemia, unspecified: Secondary | ICD-10-CM | POA: Diagnosis not present

## 2015-09-27 DIAGNOSIS — Z954 Presence of other heart-valve replacement: Secondary | ICD-10-CM

## 2015-09-27 DIAGNOSIS — I359 Nonrheumatic aortic valve disorder, unspecified: Secondary | ICD-10-CM | POA: Insufficient documentation

## 2015-09-27 DIAGNOSIS — Z952 Presence of prosthetic heart valve: Secondary | ICD-10-CM

## 2015-09-27 DIAGNOSIS — S8392XA Sprain of unspecified site of left knee, initial encounter: Secondary | ICD-10-CM | POA: Diagnosis not present

## 2015-09-27 DIAGNOSIS — I10 Essential (primary) hypertension: Secondary | ICD-10-CM | POA: Insufficient documentation

## 2015-09-27 DIAGNOSIS — W19XXXA Unspecified fall, initial encounter: Secondary | ICD-10-CM | POA: Diagnosis not present

## 2015-09-28 ENCOUNTER — Telehealth: Payer: Self-pay | Admitting: *Deleted

## 2015-09-28 NOTE — Telephone Encounter (Signed)
Spoke to wife. Results given verbalized understanding.

## 2015-09-28 NOTE — Telephone Encounter (Signed)
-----   Message from Pixie Casino, MD sent at 09/28/2015 10:18 AM EST ----- Echo is stable - valve functioning normally, normal LV function.  Dr. Debara Pickett

## 2015-09-29 ENCOUNTER — Telehealth: Payer: Self-pay | Admitting: Internal Medicine

## 2015-09-29 NOTE — Telephone Encounter (Signed)
New problem    Pt want to know results of his echo he had done 1.24.17.Andrew KitchenMarland Bonilla

## 2015-09-29 NOTE — Telephone Encounter (Signed)
Spoke to patient.  Echo Result given- echo stable, normal  Valve  Function and normal LV function . Verbalized understanding

## 2015-10-17 ENCOUNTER — Other Ambulatory Visit (HOSPITAL_COMMUNITY): Payer: Medicare Other

## 2015-11-02 ENCOUNTER — Other Ambulatory Visit (HOSPITAL_COMMUNITY): Payer: Medicare Other

## 2015-11-14 ENCOUNTER — Ambulatory Visit
Admission: RE | Admit: 2015-11-14 | Discharge: 2015-11-14 | Disposition: A | Discharge status: Home / Self Care | Payer: Medicare Other | Source: Ambulatory Visit | Attending: Family Medicine | Admitting: Family Medicine

## 2015-11-14 ENCOUNTER — Other Ambulatory Visit: Payer: Self-pay | Admitting: Family Medicine

## 2015-11-14 DIAGNOSIS — J111 Influenza due to unidentified influenza virus with other respiratory manifestations: Secondary | ICD-10-CM

## 2016-01-11 ENCOUNTER — Encounter: Payer: Self-pay | Admitting: Cardiothoracic Surgery

## 2016-01-11 ENCOUNTER — Ambulatory Visit (INDEPENDENT_AMBULATORY_CARE_PROVIDER_SITE_OTHER): Payer: Medicare Other | Admitting: Cardiothoracic Surgery

## 2016-01-11 VITALS — BP 123/73 | HR 75 | Resp 20 | Ht 67.0 in | Wt 240.0 lb

## 2016-01-11 DIAGNOSIS — I350 Nonrheumatic aortic (valve) stenosis: Secondary | ICD-10-CM

## 2016-01-11 DIAGNOSIS — Z954 Presence of other heart-valve replacement: Secondary | ICD-10-CM | POA: Diagnosis not present

## 2016-01-11 DIAGNOSIS — Z952 Presence of prosthetic heart valve: Secondary | ICD-10-CM

## 2016-01-11 NOTE — Progress Notes (Signed)
PCP is Wenda Low, MD Referring Provider is Pixie Casino, MD  Chief Complaint  Patient presents with  . Follow-up    6 month f/u review ECHO 09/27/15, HX of AVR    [id number] scheduled followup after aortic valve replacement for aortic stenosis 4 years ago. Patient has no symptoms of angina or CHF. The patient had a postop transthoracic echo performed in January which I personally reviewed. The valve is clean,minimal transvalvular gradient, LV function is normal, no other valvular abnormalities.  The patient has intentionally lost weight from 240-225. He remains active and follows a healthy Mediterranean diet   Past Medical History  Diagnosis Date  . Hyperlipemia   . Hypertension   . Constipation   . Hard of hearing   . Heart murmur     mod-severe aortic stenosis   . Recurrent upper respiratory infection (URI)     12/2011  . Prostate enlargement   . GERD (gastroesophageal reflux disease)   . Deaf, left     WEARS HEARING AID RIGHT EAR   . S/P AVR (aortic valve replacement) 01/16/2012    [facility] Ease 66mm pericardial tissue valve    Past Surgical History  Procedure Laterality Date  . Cardiac catheterization  12/17/2011    mild nonobstructive CAD - 20% LAD stenosis, 30% OM stenosis, 20% Cfx, 20% RCA lesion  . Aortic valve replacement  01/17/2012    Procedure: AORTIC VALVE REPLACEMENT (AVR);  Surgeon: Ivin Poot, MD;  Location: [place];  Service: Open Heart Surgery;  Laterality: N/A;  . Tee without cardioversion  01/2012    EF 50-55%; calcified AV annulus, severely calcified AV leaflets, mild regurg (AVR)  . Left heart catheterization with coronary angiogram N/A 12/17/2011    Procedure: LEFT HEART CATHETERIZATION WITH CORONARY ANGIOGRAM;  Surgeon: Pixie Casino, MD;  Location: [facility] CATH LAB;  Service: Cardiovascular;  Laterality: N/A;    Family History  Problem Relation Age of Onset  . Coronary artery disease Father 47    MI/smoker  . Colon cancer Brother    . Ovarian cancer Sister   . COPD Sister     Social History Social History  Substance Use Topics  . Smoking status: Never Smoker   . Smokeless tobacco: Never Used  . Alcohol Use: 0.5 - 1.0 oz/week    1-2 drink(s) per week     Comment: SOCIAL     Current Outpatient Prescriptions  Medication Sig Dispense Refill  . amoxicillin (AMOXIL) 500 MG capsule Take 4 capsules (2,000 mg total) by mouth once. TAKE 2 HOURS BEFORE ANY DENTAL PROCEDURE 4 capsule 4  . Ascorbic Acid (VITAMIN C PO) Take 1 tablet by mouth daily.    Marland Kitchen aspirin 81 MG tablet Take 81 mg by mouth daily.    Marland Kitchen atorvastatin (LIPITOR) 40 MG tablet Take 40 mg by mouth daily.    . beta carotene 30 MG capsule Take 30 mg by mouth daily.    . cholecalciferol (VITAMIN D) 1000 UNITS tablet Take 1,000 Units by mouth daily.    . Coenzyme Q10 (CO Q 10 PO) Take by mouth daily.    Marland Kitchen esomeprazole (NEXIUM) 40 MG capsule Take 40 mg by mouth daily before breakfast.    . glucosamine-chondroitin 500-400 MG tablet Take 1 tablet by mouth 2 (two) times daily.    . Lactobacillus (ACIDOPHILUS) TABS Take 1 tablet by mouth 1 day or 1 dose.     . Multiple Vitamin (MULTIVITAMIN) tablet Take 1 tablet by mouth daily.    Marland Kitchen  Multiple Vitamins-Minerals (ZINC PO) Take 1 tablet by mouth daily.    . Omega-3 Fatty Acids (OMEGA 3 PO) Take 1 capsule by mouth daily.    . Saw Palmetto, Serenoa repens, (SAW PALMETTO PO) Take 1 tablet by mouth daily.    Marland Kitchen Specialty Vitamins Products (PROSTATE PO) Take 1 tablet by mouth daily.    . tadalafil (CIALIS) 10 MG tablet Take 10 mg by mouth daily as needed. For e.d.    Marland Kitchen valsartan (DIOVAN) 80 MG tablet Take 80 mg by mouth daily.    . vitamin E 400 UNIT capsule Take 400 Units by mouth daily.     No current facility-administered medications for this visit.    No Known Allergies  Review of Systems  Losing weight intentionally Generally feels well Preparing to travel to [place] for the summer  Filed Vitals:   01/11/16 1116   BP: 123/73  Pulse: 75  Resp: 20  Weight 225 pounds  Physical Exam Alert and comfortable Lungs clear Heart irregular No aortic murmur Abdomen soft without pulsatile mass 1+ ankle edema  Diagnostic Tests: Echocardiogram personally reviewed and counseled with patient [performed January 2017]  Impression: Stable cardiac function Normal aortic valve function by echo  Plan: Continue current medications. Continue daily ambulation exercise Return for monitoring of progress in 6 months  Len Childs, MD [facility] [phone]

## 2016-07-18 ENCOUNTER — Ambulatory Visit: Payer: Medicare Other | Admitting: Cardiothoracic Surgery

## 2016-08-01 ENCOUNTER — Ambulatory Visit: Payer: Medicare Other | Admitting: Cardiothoracic Surgery

## 2016-08-03 ENCOUNTER — Ambulatory Visit: Payer: Medicare Other | Admitting: Cardiothoracic Surgery

## 2016-08-08 ENCOUNTER — Ambulatory Visit (INDEPENDENT_AMBULATORY_CARE_PROVIDER_SITE_OTHER): Payer: Medicare Other | Admitting: Cardiothoracic Surgery

## 2016-08-08 ENCOUNTER — Ambulatory Visit
Admission: RE | Admit: 2016-08-08 | Discharge: 2016-08-08 | Disposition: A | Discharge status: Home / Self Care | Payer: Medicare Other | Source: Ambulatory Visit | Attending: Cardiothoracic Surgery | Admitting: Cardiothoracic Surgery

## 2016-08-08 ENCOUNTER — Other Ambulatory Visit: Payer: Self-pay

## 2016-08-08 ENCOUNTER — Encounter: Payer: Self-pay | Admitting: Cardiothoracic Surgery

## 2016-08-08 ENCOUNTER — Other Ambulatory Visit: Payer: Self-pay | Admitting: *Deleted

## 2016-08-08 VITALS — BP 130/69 | HR 69 | Resp 20 | Ht 67.0 in | Wt 240.0 lb

## 2016-08-08 DIAGNOSIS — R05 Cough: Secondary | ICD-10-CM

## 2016-08-08 DIAGNOSIS — I350 Nonrheumatic aortic (valve) stenosis: Secondary | ICD-10-CM | POA: Diagnosis not present

## 2016-08-08 DIAGNOSIS — R059 Cough, unspecified: Secondary | ICD-10-CM

## 2016-08-08 DIAGNOSIS — Z952 Presence of prosthetic heart valve: Secondary | ICD-10-CM

## 2016-08-08 MED ORDER — BENZONATATE 100 MG PO CAPS: 100.0000 mg | ORAL_CAPSULE | Freq: Three times a day (TID) | ORAL | 0 refills | Status: DC | PRN

## 2016-08-08 NOTE — Progress Notes (Addendum)
PCP is Wenda Low, MD Referring Provider is Pixie Casino, MD  Chief Complaint  Patient presents with  . Aortic Stenosis    6 month f/u, HX of AVR 01/17/12    HPI: Patient returns for routine follow-up History of aVR with biologic valve 4.5 years ago for aortic stenosis and episodes of presyncope Maintaining sinus rhythm Echo January 2017 showed normal valve function normal LV function No cardiac symptoms Patient has noted increasing frequency of dry cough during the daytime possibly related to his weight gain and reflux. He will continue his Nexium daily and was given a prescription for Tessalon Perles prn We'll check chest x-ray today Past Medical History:  Diagnosis Date  . Constipation   . Deaf, left    WEARS HEARING AID RIGHT EAR   . GERD (gastroesophageal reflux disease)   . Hard of hearing   . Heart murmur    mod-severe aortic stenosis   . Hyperlipemia   . Hypertension   . Prostate enlargement   . Recurrent upper respiratory infection (URI)    12/2011  . S/P AVR (aortic valve replacement) 01/16/2012   [facility] Ease 33mm pericardial tissue valve    Past Surgical History:  Procedure Laterality Date  . AORTIC VALVE REPLACEMENT  01/17/2012   Procedure: AORTIC VALVE REPLACEMENT (AVR);  Surgeon: Ivin Poot, MD;  Location: [place];  Service: Open Heart Surgery;  Laterality: N/A;  . CARDIAC CATHETERIZATION  12/17/2011   mild nonobstructive CAD - 20% LAD stenosis, 30% OM stenosis, 20% Cfx, 20% RCA lesion  . LEFT HEART CATHETERIZATION WITH CORONARY ANGIOGRAM N/A 12/17/2011   Procedure: LEFT HEART CATHETERIZATION WITH CORONARY ANGIOGRAM;  Surgeon: Pixie Casino, MD;  Location[facility] CATH LAB;  Service: Cardiovascular;  Laterality: N/A;  . TEE WITHOUT CARDIOVERSION  01/2012   EF 50-55%; calcified AV annulus, severely calcified AV leaflets, mild regurg (AVR)    Family History  Problem Relation Age of Onset  . Coronary artery disease Father 35    MI/smoker  . Colon  cancer Brother   . Ovarian cancer Sister   . COPD Sister     Social History Social History  Substance Use Topics  . Smoking status: Never Smoker  . Smokeless tobacco: Never Used  . Alcohol use 0.5 - 1.0 oz/week    1 - 2 drink(s) per week     Comment: SOCIAL     Current Outpatient Prescriptions  Medication Sig Dispense Refill  . amoxicillin (AMOXIL) 500 MG capsule Take 4 capsules (2,000 mg total) by mouth once. TAKE 2 HOURS BEFORE ANY DENTAL PROCEDURE 4 capsule 4  . Ascorbic Acid (VITAMIN C PO) Take 1 tablet by mouth daily.    Marland Kitchen aspirin 81 MG tablet Take 81 mg by mouth daily.    Marland Kitchen atorvastatin (LIPITOR) 40 MG tablet Take 40 mg by mouth daily.    . beta carotene 30 MG capsule Take 30 mg by mouth daily.    . cholecalciferol (VITAMIN D) 1000 UNITS tablet Take 1,000 Units by mouth daily.    . Coenzyme Q10 (CO Q 10 PO) Take by mouth daily.    Marland Kitchen esomeprazole (NEXIUM) 40 MG capsule Take 40 mg by mouth daily before breakfast.    . glucosamine-chondroitin 500-400 MG tablet Take 1 tablet by mouth 2 (two) times daily.    . Lactobacillus (ACIDOPHILUS) TABS Take 1 tablet by mouth 1 day or 1 dose.     . Multiple Vitamin (MULTIVITAMIN) tablet Take 1 tablet by mouth daily.    Marland Kitchen  Multiple Vitamins-Minerals (ZINC PO) Take 1 tablet by mouth daily.    . Omega-3 Fatty Acids (OMEGA 3 PO) Take 1 capsule by mouth daily.    . Saw Palmetto, Serenoa repens, (SAW PALMETTO PO) Take 1 tablet by mouth daily.    Marland Kitchen Specialty Vitamins Products (PROSTATE PO) Take 1 tablet by mouth daily.    . tadalafil (CIALIS) 10 MG tablet Take 10 mg by mouth daily as needed. For e.d.    Marland Kitchen valsartan (DIOVAN) 80 MG tablet Take 80 mg by mouth daily.    . vitamin E 400 UNIT capsule Take 400 Units by mouth daily.     No current facility-administered medications for this visit.     No Known Allergies  Review of Systems  Gaining weight Planning on traveling to [place] from May until October 018  BP 130/69   Pulse 69   Resp 20    Ht 5\' 7"  (1.702 m)   Wt 240 lb (108.9 kg)   SpO2 96% Comment: RA  BMI 37.59 kg/m  Physical Exam      Exam    General- alert and comfortable   Lungs- clear without rales, wheezes   Cor- regular rate and rhythm, no murmur , gallop   Abdomen- soft, non-tender   Extremities - warm, non-tender, minimal edema   Neuro- oriented, appropriate, no focal weakness   Diagnostic Tests: Chest x-ray shows bronchitis R lung base  Impression: Patient doing well after aVR for aortic stenosis Other than weight gain his exam is unremarkable Maintaining sinus rhythm and blood pressure adequately controlled on Diovan  Plan: Return for review April 2018 We'll plan on getting echocardiogram to evaluate valve late 2018 Short course of prednisone 20/day for 5 days for bronchitis  Len Childs, MD [facility] [phone]

## 2016-08-09 ENCOUNTER — Other Ambulatory Visit: Payer: Self-pay | Admitting: *Deleted

## 2016-08-09 DIAGNOSIS — J40 Bronchitis, not specified as acute or chronic: Secondary | ICD-10-CM

## 2016-08-09 MED ORDER — PREDNISONE 10 MG PO TABS: 20.0000 mg | ORAL_TABLET | Freq: Every day | ORAL | 0 refills | Status: AC

## 2016-08-13 DIAGNOSIS — D485 Neoplasm of uncertain behavior of skin: Secondary | ICD-10-CM | POA: Diagnosis not present

## 2016-08-13 DIAGNOSIS — D225 Melanocytic nevi of trunk: Secondary | ICD-10-CM | POA: Diagnosis not present

## 2016-08-13 DIAGNOSIS — Z85828 Personal history of other malignant neoplasm of skin: Secondary | ICD-10-CM | POA: Diagnosis not present

## 2016-08-13 DIAGNOSIS — L814 Other melanin hyperpigmentation: Secondary | ICD-10-CM | POA: Diagnosis not present

## 2016-08-13 DIAGNOSIS — D1801 Hemangioma of skin and subcutaneous tissue: Secondary | ICD-10-CM | POA: Diagnosis not present

## 2016-08-13 DIAGNOSIS — L570 Actinic keratosis: Secondary | ICD-10-CM | POA: Diagnosis not present

## 2016-08-13 DIAGNOSIS — L821 Other seborrheic keratosis: Secondary | ICD-10-CM | POA: Diagnosis not present

## 2016-11-30 ENCOUNTER — Ambulatory Visit (INDEPENDENT_AMBULATORY_CARE_PROVIDER_SITE_OTHER): Payer: Medicare Other | Admitting: Internal Medicine

## 2016-11-30 ENCOUNTER — Encounter: Payer: Self-pay | Admitting: Internal Medicine

## 2016-11-30 VITALS — BP 114/58 | HR 66 | Ht 67.0 in | Wt 229.0 lb

## 2016-11-30 DIAGNOSIS — I6523 Occlusion and stenosis of bilateral carotid arteries: Secondary | ICD-10-CM | POA: Diagnosis not present

## 2016-11-30 DIAGNOSIS — Z952 Presence of prosthetic heart valve: Secondary | ICD-10-CM

## 2016-11-30 DIAGNOSIS — I10 Essential (primary) hypertension: Secondary | ICD-10-CM

## 2016-11-30 DIAGNOSIS — E782 Mixed hyperlipidemia: Secondary | ICD-10-CM

## 2016-11-30 NOTE — Patient Instructions (Addendum)
Your physician has requested that you have a carotid duplex. This test is an ultrasound of the carotid arteries in your neck. It looks at blood flow through these arteries that supply the brain with blood. Allow one hour for this exam. There are no restrictions or special instructions.  Your physician wants you to follow-up in: ONE YEAR with Dr. Hilty. You will receive a reminder letter in the mail two months in advance. If you don't receive a letter, please call our office to schedule the follow-up appointment.  

## 2016-11-30 NOTE — Progress Notes (Signed)
OFFICE NOTE  Chief Complaint:  Recent cough, URI  Primary Care Physician: Wenda Low, MD  HPI:  Andrew Bonilla is a 83 year old gentleman with a history of severe aortic stenosis who underwent [facility] Ease pericardial tissue valve placement in the aortic position Jan 16, 2012. Postoperatively he has done very well, had some shortness of breath and persistent cough which has improved, as well as some anemia which has also improved with iron supplementation.  Recently he had a presyncopal episode while he was in the shower. He was particularly warm shower which may play a role in it. Also has been having problems with coughing, which is paroxysmal.  He's had no further episodes since the syncopal episode but was seen by one of our physician assistants and he recommended an echocardiogram and monitor. The echocardiogram demonstrated a stable gradient across the aortic valve prosthesis with an EF of 50-55%. His monitor did not reveal any significant arrhythmias, pauses or any other findings that would explain his presyncopal episode.  Andrew Bonilla returns today for follow-up. He has been doing fairly well. He was increased for the past 6 months or so and denies any chest pain or worsening shortness of breath. He's had no presyncopal episodes. His cough is gone away. He is echo as mentioned was in January 2015 and showed no significant new findings. Unfortunately he's gained about 15-20 pounds and is in need of exercise and weight loss.  Andrew Bonilla has done well over the past year. He's had no further presyncopal episodes. He does get some shortness of breath when walking up hills. Recently he had some left chest discomfort. He reports however that it was tender to palpation and worse after lifting some heavy furniture. He does not note any worsening heaviness in his chest with exertion or particularly walking up steps. Review of recent lab work shows excellent cholesterol  control.  11/30/2016  Andrew Bonilla returns today for follow-up. Overall he's done well. Recently he and his wife went on a cruise in the [place].  Unfortunately he became ill and was seen in the emergency department after some sort of respiratory virus. Subsequently his wife developed a similar illness and then had a flare of diverticulitis. He denies any worsening shortness of breath or chest pain. He had cough associated with this however it is totally resolved. He underwent echo last year which demonstrated stable aortic valve gradients and normal LV function. He does have a history of bilateral carotid artery disease last assessed in 2013. At that time he had some moderate left carotid artery stenosis and very mild right carotid artery stenosis. He has been on intensive statin therapy, but is overdue for repeat carotid Dopplers. On the bright side, his weight is down 11 pounds since I last saw him.  PMHx:  Past Medical History:  Diagnosis Date  . Constipation   . Deaf, left    WEARS HEARING AID RIGHT EAR   . GERD (gastroesophageal reflux disease)   . Hard of hearing   . Heart murmur    mod-severe aortic stenosis   . Hyperlipemia   . Hypertension   . Prostate enlargement   . Recurrent upper respiratory infection (URI)    12/2011  . S/P AVR (aortic valve replacement) 01/16/2012   [facility] Ease 32mm pericardial tissue valve    Past Surgical History:  Procedure Laterality Date  . AORTIC VALVE REPLACEMENT  01/17/2012   Procedure: AORTIC VALVE REPLACEMENT (AVR);  Surgeon: Ivin Poot, MD;  Location: [facility] OR;  Service: Open Heart Surgery;  Laterality: N/A;  . CARDIAC CATHETERIZATION  12/17/2011   mild nonobstructive CAD - 20% LAD stenosis, 30% OM stenosis, 20% Cfx, 20% RCA lesion  . LEFT HEART CATHETERIZATION WITH CORONARY ANGIOGRAM N/A 12/17/2011   Procedure: LEFT HEART CATHETERIZATION WITH CORONARY ANGIOGRAM;  Surgeon: Pixie Casino, MD;  Location: [facility] CATH LAB;  Service:  Cardiovascular;  Laterality: N/A;  . TEE WITHOUT CARDIOVERSION  01/2012   EF 50-55%; calcified AV annulus, severely calcified AV leaflets, mild regurg (AVR)    FAMHx:  Family History  Problem Relation Age of Onset  . Coronary artery disease Father 67    MI/smoker  . Colon cancer Brother   . Ovarian cancer Sister   . COPD Sister     SOCHx:   reports that he has never smoked. He has never used smokeless tobacco. He reports that he drinks about 0.5 - 1.0 oz of alcohol per week . He reports that he does not use drugs.  ALLERGIES:  No Known Allergies  ROS: Pertinent items noted in HPI and remainder of comprehensive ROS otherwise negative.  HOME MEDS: Current Outpatient Prescriptions  Medication Sig Dispense Refill  . amoxicillin (AMOXIL) 500 MG capsule Take 4 capsules (2,000 mg total) by mouth once. TAKE 2 HOURS BEFORE ANY DENTAL PROCEDURE 4 capsule 4  . Ascorbic Acid (VITAMIN C PO) Take 1 tablet by mouth daily.    Marland Kitchen aspirin 81 MG tablet Take 81 mg by mouth daily.    Marland Kitchen atorvastatin (LIPITOR) 40 MG tablet Take 40 mg by mouth daily.    . benzonatate (TESSALON) 100 MG capsule Take 1 capsule (100 mg total) by mouth 3 (three) times daily as needed for cough. 60 capsule 0  . beta carotene 30 MG capsule Take 30 mg by mouth daily.    . cholecalciferol (VITAMIN D) 1000 UNITS tablet Take 1,000 Units by mouth daily.    . Coenzyme Q10 (CO Q 10 PO) Take by mouth daily.    Marland Kitchen esomeprazole (NEXIUM) 40 MG capsule Take 40 mg by mouth daily before breakfast.    . glucosamine-chondroitin 500-400 MG tablet Take 1 tablet by mouth 2 (two) times daily.    . hydrochlorothiazide (HYDRODIURIL) 25 MG tablet Take 25 mg by mouth daily.    . Lactobacillus (ACIDOPHILUS) TABS Take 1 tablet by mouth 1 day or 1 dose.     . Multiple Vitamin (MULTIVITAMIN) tablet Take 1 tablet by mouth daily.    . Multiple Vitamins-Minerals (ZINC PO) Take 1 tablet by mouth daily.    . Omega-3 Fatty Acids (OMEGA 3 PO) Take 1 capsule by  mouth daily.    . Saw Palmetto, Serenoa repens, (SAW PALMETTO PO) Take 1 tablet by mouth daily.    Marland Kitchen Specialty Vitamins Products (PROSTATE PO) Take 1 tablet by mouth daily.    . tadalafil (CIALIS) 10 MG tablet Take 10 mg by mouth daily as needed. For e.d.    Marland Kitchen valsartan (DIOVAN) 80 MG tablet Take 80 mg by mouth daily.    . vitamin E 400 UNIT capsule Take 400 Units by mouth daily.     No current facility-administered medications for this visit.     LABS/IMAGING: No results found for this or any previous visit (from the past 48 hour(s)). No results found.  VITALS: BP (!) 114/58   Pulse 66   Ht 5\' 7"  (1.702 m)   Wt 229 lb (103.9 kg)   BMI 35.87 kg/m  EXAM: GEN: Awake, NAD HEENT: PERRLA, EOMI, no bruits Lungs: Clear bilaterally Cardiovascular: Regular rate and rhythm, NL S1/S2, no M/R/G's Abdomen: Obese, soft, nontender, positive BS Extremities: Trace bilateral sock line edema Neurologic: Grossly nonfocal Psychiatric: Normal mood and affect  EKG: Normal sinus rhythm at 66  ASSESSMENT: 1. Severe aortic stenosis status post [facility] Ease 21 mm tissue valve in 2013 2. Hypertension - upper normal 3. Dyslipidemia 4. Obesity 5. Mild to moderate bilateral carotid artery disease  PLAN: 1.   Andrew Bonilla has managed to lose weight however this is due to him being sick recently. I've encouraged him to continue to work on intentional weight loss. Blood pressure is well-controlled today. His cholesterol is been at goal on a high intensity statin. He does have bilateral carotid artery disease which was mild to moderate in 2013. He is due for repeat carotid Dopplers. Follow-up annually or sooner as necessary.  Pixie Casino, MD, [facility] Attending Cardiologist Peggs C Thane Age 11/30/2016, 1:20 PM

## 2016-12-25 ENCOUNTER — Ambulatory Visit (HOSPITAL_COMMUNITY)
Admission: RE | Admit: 2016-12-25 | Discharge: 2016-12-25 | Disposition: A | Discharge status: Home / Self Care | Payer: Medicare Other | Source: Ambulatory Visit | Attending: Cardiovascular Disease | Admitting: Cardiovascular Disease

## 2016-12-25 DIAGNOSIS — E785 Hyperlipidemia, unspecified: Secondary | ICD-10-CM | POA: Insufficient documentation

## 2016-12-25 DIAGNOSIS — I10 Essential (primary) hypertension: Secondary | ICD-10-CM | POA: Diagnosis not present

## 2016-12-25 DIAGNOSIS — I6523 Occlusion and stenosis of bilateral carotid arteries: Secondary | ICD-10-CM | POA: Insufficient documentation

## 2016-12-26 ENCOUNTER — Ambulatory Visit (INDEPENDENT_AMBULATORY_CARE_PROVIDER_SITE_OTHER): Payer: Medicare Other | Admitting: Cardiothoracic Surgery

## 2016-12-26 ENCOUNTER — Encounter: Payer: Self-pay | Admitting: Cardiothoracic Surgery

## 2016-12-26 ENCOUNTER — Other Ambulatory Visit: Payer: Self-pay | Admitting: *Deleted

## 2016-12-26 VITALS — BP 123/72 | HR 75 | Resp 20 | Ht 67.0 in | Wt 226.0 lb

## 2016-12-26 DIAGNOSIS — Z952 Presence of prosthetic heart valve: Secondary | ICD-10-CM | POA: Diagnosis not present

## 2016-12-26 DIAGNOSIS — I6523 Occlusion and stenosis of bilateral carotid arteries: Secondary | ICD-10-CM

## 2016-12-26 DIAGNOSIS — I350 Nonrheumatic aortic (valve) stenosis: Secondary | ICD-10-CM

## 2016-12-26 NOTE — Progress Notes (Signed)
PCP is Andrew Low, MD Referring Provider is Pixie Casino, MD  Chief Complaint  Patient presents with  . Aortic Stenosis    4 month f/u HX of AVR    HPI: Patient returns for 6 month follow-up The patient had aortic stenosis had had a [facility] ease pericardial valve replacement 2013 Last echocardiogram January 2017 showed normal LV function, normal valvular function with mean gradient 10 mmHg. The patient is in a sinus rhythm. The patient has had some brief presyncopal episodes recently-once in the shower and once during intercourse. A carotid duplex scan was performed earlier this month which demonstrates stable mild-moderate carotid disease-40% on the left and 50% stenosis on the right. He is taking an aspirin and Lipitor as adequate therapy.  The patient has been recently seen by his cardiology and there is no indication of cardiac arrhythmia or coronary disease or new valvular disease as part etiology of his brief presyncopal episode.  Past Medical History:  Diagnosis Date  . Constipation   . Deaf, left    WEARS HEARING AID RIGHT EAR   . GERD (gastroesophageal reflux disease)   . Hard of hearing   . Heart murmur    mod-severe aortic stenosis   . Hyperlipemia   . Hypertension   . Prostate enlargement   . Recurrent upper respiratory infection (URI)    12/2011  . S/P AVR (aortic valve replacement) 01/16/2012   [facility] Ease 29mm pericardial tissue valve    Past Surgical History:  Procedure Laterality Date  . AORTIC VALVE REPLACEMENT  01/17/2012   Procedure: AORTIC VALVE REPLACEMENT (AVR);  Surgeon: Ivin Poot, MD;  Location: [place];  Service: Open Heart Surgery;  Laterality: N/A;  . CARDIAC CATHETERIZATION  12/17/2011   mild nonobstructive CAD - 20% LAD stenosis, 30% OM stenosis, 20% Cfx, 20% RCA lesion  . LEFT HEART CATHETERIZATION WITH CORONARY ANGIOGRAM N/A 12/17/2011   Procedure: LEFT HEART CATHETERIZATION WITH CORONARY ANGIOGRAM;  Surgeon: Pixie Casino, MD;   Location: [facility] CATH LAB;  Service: Cardiovascular;  Laterality: N/A;  . TEE WITHOUT CARDIOVERSION  01/2012   EF 50-55%; calcified AV annulus, severely calcified AV leaflets, mild regurg (AVR)    Family History  Problem Relation Age of Onset  . Coronary artery disease Father 32    MI/smoker  . Colon cancer Brother   . Ovarian cancer Sister   . COPD Sister     Social History Social History  Substance Use Topics  . Smoking status: Never Smoker  . Smokeless tobacco: Never Used  . Alcohol use 0.5 - 1.0 oz/week    1 - 2 drink(s) per week     Comment: SOCIAL     Current Outpatient Prescriptions  Medication Sig Dispense Refill  . amoxicillin (AMOXIL) 500 MG capsule Take 4 capsules (2,000 mg total) by mouth once. TAKE 2 HOURS BEFORE ANY DENTAL PROCEDURE 4 capsule 4  . Ascorbic Acid (VITAMIN C PO) Take 1 tablet by mouth daily.    Marland Kitchen aspirin 81 MG tablet Take 81 mg by mouth daily.    Marland Kitchen atorvastatin (LIPITOR) 40 MG tablet Take 40 mg by mouth daily.    . benzonatate (TESSALON) 100 MG capsule Take 1 capsule (100 mg total) by mouth 3 (three) times daily as needed for cough. 60 capsule 0  . beta carotene 30 MG capsule Take 30 mg by mouth daily.    . cholecalciferol (VITAMIN D) 1000 UNITS tablet Take 1,000 Units by mouth daily.    . Coenzyme  Q10 (CO Q 10 PO) Take by mouth daily.    Marland Kitchen esomeprazole (NEXIUM) 40 MG capsule Take 40 mg by mouth daily before breakfast.    . glucosamine-chondroitin 500-400 MG tablet Take 1 tablet by mouth 2 (two) times daily.    . hydrochlorothiazide (HYDRODIURIL) 25 MG tablet Take 25 mg by mouth daily.    . Lactobacillus (ACIDOPHILUS) TABS Take 1 tablet by mouth 1 day or 1 dose.     . Multiple Vitamin (MULTIVITAMIN) tablet Take 1 tablet by mouth daily.    . Multiple Vitamins-Minerals (ZINC PO) Take 1 tablet by mouth daily.    . Omega-3 Fatty Acids (OMEGA 3 PO) Take 1 capsule by mouth daily.    . Saw Palmetto, Serenoa repens, (SAW PALMETTO PO) Take 1 tablet by mouth  daily.    Marland Kitchen Specialty Vitamins Products (PROSTATE PO) Take 1 tablet by mouth daily.    . tadalafil (CIALIS) 10 MG tablet Take 10 mg by mouth daily as needed. For e.d.    Marland Kitchen valsartan (DIOVAN) 80 MG tablet Take 80 mg by mouth daily.    . vitamin E 400 UNIT capsule Take 400 Units by mouth daily.     No current facility-administered medications for this visit.     No Known Allergies  Review of Systems   The patient went on a [place] cruise developed viral illness with cough weight loss and weakness. He is recovering. He plans on traveling to [place] for his summer stay next month He will continue to try to lose weight which will help his shortness of breath   BP 123/72   Pulse 75   Resp 20   Ht 5\' 7"  (1.702 m)   Wt 226 lb (102.5 kg)   SpO2 98%   BMI 35.40 kg/m  Physical Exam On exam he is alert and comfortable Breath sounds are equal Heart rhythm is regular, no murmur or gallop No tibial edema Good peripheral pulses Neuro intact Abdomen soft obese  Diagnostic Tests: Carotid duplex scans personally reviewed and results counseled with patient  Impression: Doing well almost 5 years after aortic valve replacement for aortic stenosis Continue current therapy Patient recommended to lose 10-20 pounds over several months  Plan: Return for 6 month follow-up later this year Continue current meds  Len Childs, MD [facility] [phone]

## 2017-01-04 ENCOUNTER — Telehealth: Payer: Self-pay | Admitting: Internal Medicine

## 2017-05-14 ENCOUNTER — Telehealth: Payer: Self-pay | Admitting: Internal Medicine

## 2017-05-14 MED ORDER — IRBESARTAN 75 MG PO TABS
75.0000 mg | ORAL_TABLET | Freq: Every day | ORAL | 3 refills | Status: DC
Start: 2017-05-14 — End: 2017-09-10

## 2017-05-14 NOTE — Telephone Encounter (Signed)
Returned call to patient.  Patient aware to stop valsartan per recall.   Patient states he was told by Dr. Debara Pickett that he could take a combination drug that included his HCTZ as well.   Wondering if he can get that prescribed now instead.     Advised I would seek advice and call back

## 2017-05-14 NOTE — Telephone Encounter (Signed)
lmtcb

## 2017-05-14 NOTE — Telephone Encounter (Signed)
He will need to switch to irbesartan 75 mg qd.  Unfortunately it doesn't come mixed with hctz until the 150 mg dose and doesn't look like he needs that much.  Will have to stay at 2 tablets

## 2017-05-14 NOTE — Telephone Encounter (Signed)
New message     Pt c/o medication issue:  1. Name of Medication:  Valsartan   2. How are you currently taking this medication (dosage and times per day)?  1x a day 80 mg   3. Are you having a reaction (difficulty breathing--STAT)? No       4. What is your medication issue?  Medication was recalled , needs a replacement  [facility] [address]

## 2017-05-14 NOTE — Telephone Encounter (Signed)
Patient aware and verbalized understanding.   New rx sent to pharmacy.

## 2017-06-26 ENCOUNTER — Other Ambulatory Visit: Payer: Self-pay | Admitting: *Deleted

## 2017-06-26 ENCOUNTER — Ambulatory Visit (INDEPENDENT_AMBULATORY_CARE_PROVIDER_SITE_OTHER): Payer: Medicare Other | Admitting: Cardiothoracic Surgery

## 2017-06-26 ENCOUNTER — Encounter: Payer: Self-pay | Admitting: Cardiothoracic Surgery

## 2017-06-26 VITALS — BP 103/58 | HR 81 | Ht 67.0 in | Wt 229.0 lb

## 2017-06-26 DIAGNOSIS — Z952 Presence of prosthetic heart valve: Secondary | ICD-10-CM

## 2017-06-26 DIAGNOSIS — M1712 Unilateral primary osteoarthritis, left knee: Secondary | ICD-10-CM | POA: Diagnosis not present

## 2017-06-26 NOTE — Progress Notes (Signed)
PCP is Wenda Low, MD Referring Provider is Debara Pickett Nadean Corwin, MD  Chief Complaint  Patient presents with  . Follow-up    6 month follow up    HPI: Patient returns for 6 month follow-up. He is status post pericardial AVR in 2013 He has no cardiac complaints. He is currently having problems with arthritis of his left knee. He had some significant problems while he was spending time with his family in [place]  this past summer   He is receiving injections but may need total knee replacement by Dr. Maureen Ralphs His last echocardiogram was almost 2 years ago-normal LVEF, normal functioning AVR with a 11 mm mercury mean gradient. The patient denies symptoms of dyspnea on exertion or angina. He remains in sinus rhythm. Blood pressure is normal.  We will set the patient up to have a echocardiogram read by Dr. Debara Pickett his primary cardiologist for preoperative orthopedic surgery clearance. Past Medical History:  Diagnosis Date  . Constipation   . Deaf, left    WEARS HEARING AID RIGHT EAR   . GERD (gastroesophageal reflux disease)   . Hard of hearing   . Heart murmur    mod-severe aortic stenosis   . Hyperlipemia   . Hypertension   . Prostate enlargement   . Recurrent upper respiratory infection (URI)    12/2011  . S/P AVR (aortic valve replacement) 01/16/2012   [facility] Ease 39mm pericardial tissue valve    Past Surgical History:  Procedure Laterality Date  . AORTIC VALVE REPLACEMENT  01/17/2012   Procedure: AORTIC VALVE REPLACEMENT (AVR);  Surgeon: Ivin Poot, MD;  Location: [place];  Service: Open Heart Surgery;  Laterality: N/A;  . CARDIAC CATHETERIZATION  12/17/2011   mild nonobstructive CAD - 20% LAD stenosis, 30% OM stenosis, 20% Cfx, 20% RCA lesion  . LEFT HEART CATHETERIZATION WITH CORONARY ANGIOGRAM N/A 12/17/2011   Procedure: LEFT HEART CATHETERIZATION WITH CORONARY ANGIOGRAM;  Surgeon: Pixie Casino, MD;  Location: [facility] CATH LAB;  Service: Cardiovascular;  Laterality:  N/A;  . TEE WITHOUT CARDIOVERSION  01/2012   EF 50-55%; calcified AV annulus, severely calcified AV leaflets, mild regurg (AVR)    Family History  Problem Relation Age of Onset  . Coronary artery disease Father 74       MI/smoker  . Colon cancer Brother   . Ovarian cancer Sister   . COPD Sister     Social History Social History  Substance Use Topics  . Smoking status: Never Smoker  . Smokeless tobacco: Never Used  . Alcohol use 0.5 - 1.0 oz/week    1 - 2 drink(s) per week     Comment: SOCIAL     Current Outpatient Prescriptions  Medication Sig Dispense Refill  . amoxicillin (AMOXIL) 500 MG capsule Take 4 capsules (2,000 mg total) by mouth once. TAKE 2 HOURS BEFORE ANY DENTAL PROCEDURE 4 capsule 4  . Ascorbic Acid (VITAMIN C PO) Take 1 tablet by mouth daily.    Marland Kitchen aspirin 81 MG tablet Take 81 mg by mouth daily.    Marland Kitchen atorvastatin (LIPITOR) 40 MG tablet Take 40 mg by mouth daily.    . benzonatate (TESSALON) 100 MG capsule Take 1 capsule (100 mg total) by mouth 3 (three) times daily as needed for cough. 60 capsule 0  . beta carotene 30 MG capsule Take 30 mg by mouth daily.    . cholecalciferol (VITAMIN D) 1000 UNITS tablet Take 1,000 Units by mouth daily.    . Coenzyme Q10 (  CO Q 10 PO) Take by mouth daily.    Marland Kitchen esomeprazole (NEXIUM) 40 MG capsule Take 40 mg by mouth daily before breakfast.    . glucosamine-chondroitin 500-400 MG tablet Take 1 tablet by mouth 2 (two) times daily.    . hydrochlorothiazide (HYDRODIURIL) 25 MG tablet Take 25 mg by mouth daily.    . irbesartan (AVAPRO) 75 MG tablet Take 1 tablet (75 mg total) by mouth daily. 30 tablet 3  . Lactobacillus (ACIDOPHILUS) TABS Take 1 tablet by mouth 1 day or 1 dose.     . Multiple Vitamin (MULTIVITAMIN) tablet Take 1 tablet by mouth daily.    . Multiple Vitamins-Minerals (ZINC PO) Take 1 tablet by mouth daily.    . Omega-3 Fatty Acids (OMEGA 3 PO) Take 1 capsule by mouth daily.    . Saw Palmetto, Serenoa repens, (SAW  PALMETTO PO) Take 1 tablet by mouth daily.    Marland Kitchen Specialty Vitamins Products (PROSTATE PO) Take 1 tablet by mouth daily.    . tadalafil (CIALIS) 10 MG tablet Take 10 mg by mouth daily as needed. For e.d.    Marland Kitchen vitamin E 400 UNIT capsule Take 400 Units by mouth daily.     No current facility-administered medications for this visit.     No Known Allergies  Review of Systems  Weight stable   no symptoms of heart failure or angina No presyncope No recent hospitalizations  BP (!) 103/58   Pulse 81   Ht 5\' 7"  (1.702 m)   Wt 229 lb (103.9 kg)   SpO2 99%   BMI 35.87 kg/m  Physical Exam      Exam    General- alert and comfortable   Lungs- clear without rales, wheezes   Cor- regular rate and rhythm, no murmur , gallop   Abdomen- soft, non-tender   Extremities - warm, non-tender, minimal edema   Neuro- oriented, appropriate, no focal weakness   Diagnostic Tests:  none  Impression:  doing well after aVR for aortic stenosis-5 years postop   It appears he has left total knee replacement projected in the near future Will arrange for repeat echocardiogram [last study January 2017] and patient will need to be assessed by his cardiologist Dr. Debara Pickett as well before knee replacement surgery. From a surgical standpoint it appears he would be safe to proceed with total knee replacement at the appropriate time by Dr Maureen Ralphs  Plan:   Andrew Aquas Adelene Idler, MD [facility] [phone]

## 2017-06-28 NOTE — Progress Notes (Signed)
Andrew Bonilla will need a cardiac clearance appt priot to knee surgery, but after his echo.  Thanks.   Dr. Lemmie Evens

## 2017-07-05 ENCOUNTER — Ambulatory Visit (HOSPITAL_COMMUNITY): Payer: Medicare Other | Attending: Cardiology

## 2017-07-05 ENCOUNTER — Other Ambulatory Visit: Payer: Self-pay

## 2017-07-05 DIAGNOSIS — Z952 Presence of prosthetic heart valve: Secondary | ICD-10-CM

## 2017-07-05 DIAGNOSIS — R011 Cardiac murmur, unspecified: Secondary | ICD-10-CM | POA: Insufficient documentation

## 2017-07-05 DIAGNOSIS — E785 Hyperlipidemia, unspecified: Secondary | ICD-10-CM | POA: Insufficient documentation

## 2017-07-05 DIAGNOSIS — I358 Other nonrheumatic aortic valve disorders: Secondary | ICD-10-CM | POA: Diagnosis present

## 2017-07-05 DIAGNOSIS — Z48812 Encounter for surgical aftercare following surgery on the circulatory system: Secondary | ICD-10-CM | POA: Diagnosis not present

## 2017-07-05 DIAGNOSIS — I10 Essential (primary) hypertension: Secondary | ICD-10-CM | POA: Insufficient documentation

## 2017-07-05 DIAGNOSIS — I071 Rheumatic tricuspid insufficiency: Secondary | ICD-10-CM | POA: Diagnosis not present

## 2017-07-10 ENCOUNTER — Encounter: Payer: Self-pay | Admitting: Internal Medicine

## 2017-07-10 ENCOUNTER — Ambulatory Visit: Payer: Medicare Other | Admitting: Internal Medicine

## 2017-07-10 VITALS — BP 126/70 | HR 79 | Ht 67.0 in | Wt 228.0 lb

## 2017-07-10 DIAGNOSIS — E782 Mixed hyperlipidemia: Secondary | ICD-10-CM

## 2017-07-10 DIAGNOSIS — Z952 Presence of prosthetic heart valve: Secondary | ICD-10-CM

## 2017-07-10 DIAGNOSIS — I6523 Occlusion and stenosis of bilateral carotid arteries: Secondary | ICD-10-CM

## 2017-07-10 DIAGNOSIS — I10 Essential (primary) hypertension: Secondary | ICD-10-CM | POA: Diagnosis not present

## 2017-07-10 NOTE — Patient Instructions (Signed)
Your physician wants you to follow-up in: ONE YEAR with Dr. Hilty. You will receive a reminder letter in the mail two months in advance. If you don't receive a letter, please call our office to schedule the follow-up appointment.  

## 2017-07-10 NOTE — Progress Notes (Signed)
OFFICE NOTE  Chief Complaint:  Preoperative risk assessment  Primary Care Physician: Wenda Low, MD  HPI:  Andrew Bonilla is a 83 year old gentleman with a history of severe aortic stenosis who underwent [facility] Ease pericardial tissue valve placement in the aortic position Jan 16, 2012. Postoperatively he has done very well, had some shortness of breath and persistent cough which has improved, as well as some anemia which has also improved with iron supplementation.  Recently he had a presyncopal episode while he was in the shower. He was particularly warm shower which may play a role in it. Also has been having problems with coughing, which is paroxysmal.  He's had no further episodes since the syncopal episode but was seen by one of our physician assistants and he recommended an echocardiogram and monitor. The echocardiogram demonstrated a stable gradient across the aortic valve prosthesis with an EF of 50-55%. His monitor did not reveal any significant arrhythmias, pauses or any other findings that would explain his presyncopal episode.  Andrew Bonilla returns today for follow-up. He has been doing fairly well. He was increased for the past 6 months or so and denies any chest pain or worsening shortness of breath. He's had no presyncopal episodes. His cough is gone away. He is echo as mentioned was in January 2015 and showed no significant new findings. Unfortunately he's gained about 15-20 pounds and is in need of exercise and weight loss.  Andrew Bonilla has done well over the past year. He's had no further presyncopal episodes. He does get some shortness of breath when walking up hills. Recently he had some left chest discomfort. He reports however that it was tender to palpation and worse after lifting some heavy furniture. He does not note any worsening heaviness in his chest with exertion or particularly walking up steps. Review of recent lab work shows excellent cholesterol  control.  11/30/2016  Andrew Bonilla returns today for follow-up. Overall he's done well. Recently he and his wife went on a cruise in the [place].  Unfortunately he became ill and was seen in the emergency department after some sort of respiratory virus. Subsequently his wife developed a similar illness and then had a flare of diverticulitis. He denies any worsening shortness of breath or chest pain. He had cough associated with this however it is totally resolved. He underwent echo last year which demonstrated stable aortic valve gradients and normal LV function. He does have a history of bilateral carotid artery disease last assessed in 2013. At that time he had some moderate left carotid artery stenosis and very mild right carotid artery stenosis. He has been on intensive statin therapy, but is overdue for repeat carotid Dopplers. On the bright side, his weight is down 11 pounds since I last saw him.  07/10/2017  Andrew Bonilla was seen today in follow-up.  He is struggling with problems with his left knee.  He is told that he had a meniscal tear but also has bone-on-bone and may need knee replacement.  Currently he is undergoing some injections, but could be facing surgery with Dr. Maureen Ralphs.  As part of a preoperative evaluation I advised a repeat echocardiogram to reassess his aortic valve.  That was performed on July 05, 2017, this demonstrated an LVEF 60-65%, mild LVH, grade 1 diastolic dysfunction and a stable gradients across the aortic valve with a mean gradient of 14 mmHg and no evidence of obstruction.  Symptomatically he is done well, denying any chest pain  or worsening shortness of breath.  His long-standing cough has finally resolved.  The other issue is that he is recently gained a significant amount of weight.  He needs to work on this and certainly worsening his knee problems.  That being said, he can do 4 metabolic equivalents of activity, including walking up a flight of stairs and recently was  walking up a number of hills in [place] without any difficulty.  PMHx:  Past Medical History:  Diagnosis Date  . Constipation   . Deaf, left    WEARS HEARING AID RIGHT EAR   . GERD (gastroesophageal reflux disease)   . Hard of hearing   . Heart murmur    mod-severe aortic stenosis   . Hyperlipemia   . Hypertension   . Prostate enlargement   . Recurrent upper respiratory infection (URI)    12/2011  . S/P AVR (aortic valve replacement) 01/16/2012   [facility] Ease 64mm pericardial tissue valve    Past Surgical History:  Procedure Laterality Date  . CARDIAC CATHETERIZATION  12/17/2011   mild nonobstructive CAD - 20% LAD stenosis, 30% OM stenosis, 20% Cfx, 20% RCA lesion  . TEE WITHOUT CARDIOVERSION  01/2012   EF 50-55%; calcified AV annulus, severely calcified AV leaflets, mild regurg (AVR)    FAMHx:  Family History  Problem Relation Age of Onset  . Coronary artery disease Father 55       MI/smoker  . Colon cancer Brother   . Ovarian cancer Sister   . COPD Sister     SOCHx:   reports that  has never smoked. he has never used smokeless tobacco. He reports that he drinks about 0.5 - 1.0 oz of alcohol per week. He reports that he does not use drugs.  ALLERGIES:  No Known Allergies  ROS: Pertinent items noted in HPI and remainder of comprehensive ROS otherwise negative.  HOME MEDS: Current Outpatient Medications  Medication Sig Dispense Refill  . amoxicillin (AMOXIL) 500 MG capsule Take 4 capsules (2,000 mg total) by mouth once. TAKE 2 HOURS BEFORE ANY DENTAL PROCEDURE 4 capsule 4  . Ascorbic Acid (VITAMIN C PO) Take 1 tablet by mouth daily.    Marland Kitchen aspirin 81 MG tablet Take 81 mg by mouth daily.    Marland Kitchen atorvastatin (LIPITOR) 40 MG tablet Take 40 mg by mouth daily.    . benzonatate (TESSALON) 100 MG capsule Take 1 capsule (100 mg total) by mouth 3 (three) times daily as needed for cough. 60 capsule 0  . beta carotene 30 MG capsule Take 30 mg by mouth daily.    .  cholecalciferol (VITAMIN D) 1000 UNITS tablet Take 1,000 Units by mouth daily.    . Coenzyme Q10 (CO Q 10 PO) Take by mouth daily.    Marland Kitchen esomeprazole (NEXIUM) 40 MG capsule Take 40 mg by mouth daily before breakfast.    . glucosamine-chondroitin 500-400 MG tablet Take 1 tablet by mouth 2 (two) times daily.    . hydrochlorothiazide (HYDRODIURIL) 25 MG tablet Take 25 mg by mouth daily.    . irbesartan (AVAPRO) 75 MG tablet Take 1 tablet (75 mg total) by mouth daily. 30 tablet 3  . Lactobacillus (ACIDOPHILUS) TABS Take 1 tablet by mouth 1 day or 1 dose.     . Multiple Vitamin (MULTIVITAMIN) tablet Take 1 tablet by mouth daily.    . Multiple Vitamins-Minerals (ZINC PO) Take 1 tablet by mouth daily.    . Omega-3 Fatty Acids (OMEGA 3 PO) Take 1  capsule by mouth daily.    . Saw Palmetto, Serenoa repens, (SAW PALMETTO PO) Take 1 tablet by mouth daily.    Marland Kitchen Specialty Vitamins Products (PROSTATE PO) Take 1 tablet by mouth daily.    . tadalafil (CIALIS) 10 MG tablet Take 10 mg by mouth daily as needed. For e.d.    Marland Kitchen vitamin E 400 UNIT capsule Take 400 Units by mouth daily.     No current facility-administered medications for this visit.     LABS/IMAGING: No results found for this or any previous visit (from the past 48 hour(s)). No results found.  VITALS: BP 126/70   Pulse 79   Ht 5\' 7"  (1.702 m)   Wt 228 lb (103.4 kg)   SpO2 98%   BMI 35.71 kg/m   EXAM: General appearance: alert, no distress and moderately obese Neck: no carotid bruit, no JVD and thyroid not enlarged, symmetric, no tenderness/mass/nodules Lungs: clear to auscultation bilaterally Heart: regular rate and rhythm, S1, S2 normal, no murmur, click, rub or gallop Abdomen: soft, non-tender; bowel sounds normal; no masses,  no organomegaly Extremities: extremities normal, atraumatic, no cyanosis or edema Pulses: 2+ and symmetric Skin: Skin color, texture, turgor normal. No rashes or lesions Neurologic: Grossly normal Psych:  Pleasant  EKG: Sinus rhythm at 79-personally reviewed  ASSESSMENT: 1. Severe aortic stenosis status post [facility] Ease 21 mm tissue valve in 2013 2. Hypertension 3. Dyslipidemia 4. Obesity 5. Mild to moderate bilateral carotid artery disease  PLAN: 1.   Andrew Bonilla had no significant change in his echocardiogram with normal LV function and stable gradients across his aortic valve.  He is active enough to determine he is at acceptable risk for knee surgery should he proceed with that in the next year.  Blood pressure is fairly well controlled.  We will need to continue to survey his carotid artery disease and dyslipidemia as well.  Plan to follow-up annually or sooner as necessary.  Pixie Casino, MD, [facility] Attending Cardiologist [place] C Hilty 07/10/2017, 9:09 AM

## 2017-09-03 DIAGNOSIS — M1710 Unilateral primary osteoarthritis, unspecified knee: Secondary | ICD-10-CM

## 2017-09-03 DIAGNOSIS — M179 Osteoarthritis of knee, unspecified: Secondary | ICD-10-CM

## 2017-09-03 HISTORY — DX: Unilateral primary osteoarthritis, unspecified knee: M17.10

## 2017-09-03 HISTORY — DX: Osteoarthritis of knee, unspecified: M17.9

## 2017-09-10 ENCOUNTER — Other Ambulatory Visit: Payer: Self-pay | Admitting: Internal Medicine

## 2017-12-25 ENCOUNTER — Encounter: Payer: Self-pay | Admitting: Cardiothoracic Surgery

## 2017-12-25 ENCOUNTER — Ambulatory Visit: Payer: Medicare Other | Admitting: Cardiothoracic Surgery

## 2017-12-25 ENCOUNTER — Other Ambulatory Visit: Payer: Self-pay

## 2017-12-25 VITALS — BP 112/60 | HR 57 | Resp 16 | Ht 67.0 in | Wt 231.5 lb

## 2017-12-25 DIAGNOSIS — Z952 Presence of prosthetic heart valve: Secondary | ICD-10-CM | POA: Diagnosis not present

## 2017-12-25 NOTE — Progress Notes (Signed)
PCP is Wenda Low, MD Referring Provider is Debara Pickett Nadean Corwin, MD  Chief Complaint  Patient presents with  . Routine Post Op    6 month f/u s/p AVReplacement 01/17/12...ECHO 07/05/17    HPI: Patient returns for six-month follow-up now 6 years after AVR for aortic stenosis with a tricuspid valve.  Patient continues to do well and is ready to travel to [place] for the summer.  No symptoms of heart failure or angina.  Blood pressure remains good control.  He still continues to try to lose weight he is counseled on the importance of heart healthy activities including walking-continuous aerobic activity for 20 to 30 minutes 5 days a week.  Past Medical History:  Diagnosis Date  . Constipation   . Deaf, left    WEARS HEARING AID RIGHT EAR   . GERD (gastroesophageal reflux disease)   . Hard of hearing   . Heart murmur    mod-severe aortic stenosis   . Hyperlipemia   . Hypertension   . Prostate enlargement   . Recurrent upper respiratory infection (URI)    12/2011  . S/P AVR (aortic valve replacement) 01/16/2012   [facility] Ease 31mm pericardial tissue valve    Past Surgical History:  Procedure Laterality Date  . AORTIC VALVE REPLACEMENT  01/17/2012   Procedure: AORTIC VALVE REPLACEMENT (AVR);  Surgeon: Ivin Poot, MD;  Location: [facility];  Service: Open Heart Surgery;  Laterality: N/A;  . CARDIAC CATHETERIZATION  12/17/2011   mild nonobstructive CAD - 20% LAD stenosis, 30% OM stenosis, 20% Cfx, 20% RCA lesion  . LEFT HEART CATHETERIZATION WITH CORONARY ANGIOGRAM N/A 12/17/2011   Procedure: LEFT HEART CATHETERIZATION WITH CORONARY ANGIOGRAM;  Surgeon: Pixie Casino, MD;  Location: [facility] CATH LAB;  Service: Cardiovascular;  Laterality: N/A;  . TEE WITHOUT CARDIOVERSION  01/2012   EF 50-55%; calcified AV annulus, severely calcified AV leaflets, mild regurg (AVR)    Family History  Problem Relation Age of Onset  . Coronary artery disease Father 97       MI/smoker  . Colon cancer  Brother   . Ovarian cancer Sister   . COPD Sister     Social History Social History   Tobacco Use  . Smoking status: Never Smoker  . Smokeless tobacco: Never Used  Substance Use Topics  . Alcohol use: Yes    Alcohol/week: 0.5 - 1.0 oz    Types: 1 - 2 drink(s) per week    Comment: SOCIAL   . Drug use: No    Current Outpatient Medications  Medication Sig Dispense Refill  . amoxicillin (AMOXIL) 500 MG capsule Take 4 capsules (2,000 mg total) by mouth once. TAKE 2 HOURS BEFORE ANY DENTAL PROCEDURE 4 capsule 4  . Ascorbic Acid (VITAMIN C PO) Take 1 tablet by mouth daily.    Marland Kitchen aspirin 81 MG tablet Take 81 mg by mouth daily.    Marland Kitchen atorvastatin (LIPITOR) 40 MG tablet Take 40 mg by mouth daily.    . benzonatate (TESSALON) 100 MG capsule Take 1 capsule (100 mg total) by mouth 3 (three) times daily as needed for cough. 60 capsule 0  . beta carotene 30 MG capsule Take 30 mg by mouth daily.    . cholecalciferol (VITAMIN D) 1000 UNITS tablet Take 1,000 Units by mouth daily.    . Coenzyme Q10 (CO Q 10 PO) Take by mouth daily.    Marland Kitchen esomeprazole (NEXIUM) 40 MG capsule Take 40 mg by mouth daily before breakfast.    .  glucosamine-chondroitin 500-400 MG tablet Take 1 tablet by mouth 2 (two) times daily.    . hydrochlorothiazide (HYDRODIURIL) 25 MG tablet Take 25 mg by mouth daily.    . irbesartan (AVAPRO) 75 MG tablet TAKE 1 TABLET BY MOUTH EVERY DAY 30 tablet 11  . Lactobacillus (ACIDOPHILUS) TABS Take 1 tablet by mouth 1 day or 1 dose.     . Multiple Vitamin (MULTIVITAMIN) tablet Take 1 tablet by mouth daily.    . Multiple Vitamins-Minerals (ZINC PO) Take 1 tablet by mouth daily.    . Omega-3 Fatty Acids (OMEGA 3 PO) Take 1 capsule by mouth daily.    . Saw Palmetto, Serenoa repens, (SAW PALMETTO PO) Take 2 tablets by mouth daily.     Marland Kitchen Specialty Vitamins Products (PROSTATE PO) Take 1 tablet by mouth daily.    . tadalafil (CIALIS) 10 MG tablet Take 10 mg by mouth daily as needed. For e.d.    Marland Kitchen  vitamin E 400 UNIT capsule Take 400 Units by mouth daily.     No current facility-administered medications for this visit.     No Known Allergies  Review of Systems  Unable to lose weight No accidental complaints No syncope or falls Left knee arthritis limits his activity and he anticipates knee replacement in the next 2 years by Dr. Binnie Rail  BP 112/60 (BP Location: Left Arm, Patient Position: Sitting, Cuff Size: Large)   Pulse (!) 57   Resp 16   Ht 5\' 7"  (1.702 m)   Wt 231 lb 8 oz (105 kg)   SpO2 98% Comment: ON RA  BMI 36.26 kg/m  Physical Exam      Exam    General- alert and comfortable    Neck- no JVD, no cervical adenopathy palpable, no carotid bruit   Lungs- clear without rales, wheezes   Cor- regular rate and rhythm, no murmur , gallop   Abdomen- soft, non-tender   Extremities - warm, non-tender, minimal edema   Neuro- oriented, appropriate, no focal weakness   Diagnostic Tests: None echocardiogram performed late 2018 showed normal functioning prosthetic AVR with minimal gradient and no AI   Impression: Doing well 6 years after AVR Continue current medications Importance of antibiotic prophylaxis for any significant dental procedures reviewed with patient  Plan: Return for follow-up in 6 months.   Len Childs, MD [facility] [phone]

## 2017-12-31 ENCOUNTER — Ambulatory Visit (HOSPITAL_COMMUNITY)
Admission: RE | Admit: 2017-12-31 | Discharge: 2017-12-31 | Disposition: A | Discharge status: Home / Self Care | Payer: Medicare Other | Source: Ambulatory Visit | Attending: Internal Medicine | Admitting: Internal Medicine

## 2017-12-31 DIAGNOSIS — I6523 Occlusion and stenosis of bilateral carotid arteries: Secondary | ICD-10-CM

## 2018-01-07 ENCOUNTER — Telehealth: Payer: Self-pay | Admitting: *Deleted

## 2018-01-07 DIAGNOSIS — I6523 Occlusion and stenosis of bilateral carotid arteries: Secondary | ICD-10-CM

## 2018-01-07 NOTE — Telephone Encounter (Signed)
-----   Message from Pixie Casino, MD sent at 01/01/2018 12:56 PM EDT ----- Mild right and moderate left carotid artery stenosis. Repeat in 1 year.  Dr. Lemmie Evens

## 2018-01-07 NOTE — Telephone Encounter (Signed)
Patient made aware of results and verbalized his understanding. Repeat carotid orders has been placed for one year.

## 2018-01-13 ENCOUNTER — Telehealth: Payer: Self-pay | Admitting: Internal Medicine

## 2018-01-13 ENCOUNTER — Telehealth: Payer: Self-pay

## 2018-01-13 NOTE — Telephone Encounter (Signed)
°*  STAT* If patient is at the pharmacy, call can be transferred to refill team.   1. Which medications need to be refilled? (please list name of each medication and dose if known) Irbesartan  2. Which pharmacy/location (including street and city if local pharmacy) is medication to be sent to? [facility]- [phone] 3. Do they need a 30 day or 90 day supply? 90 and refills

## 2018-01-13 NOTE — Telephone Encounter (Signed)
Andrew Bonilla called and stated that he was in need of a refill on his irbesartan.  Advised patient to contact his [facility], Dr. Lysbeth Penner office for a refill. Patient acknowledged receipt.

## 2018-01-14 ENCOUNTER — Other Ambulatory Visit: Payer: Self-pay

## 2018-01-14 MED ORDER — IRBESARTAN 75 MG PO TABS: 75.0000 mg | ORAL_TABLET | Freq: Every day | ORAL | 6 refills | Status: DC

## 2018-01-14 NOTE — Telephone Encounter (Signed)
Submitted to pharmacy 

## 2018-06-03 NOTE — Patient Instructions (Addendum)
Andrew Bonilla  06/03/2018   Your procedure is scheduled on: 06-09-18  Report to [facility] Main  Entrance              Report to admitting at      0800 AM    Call this number if you have problems the morning of surgery [phone]    Remember: Do not eat food or drink liquids :After Midnight. BRUSH YOUR TEETH MORNING OF SURGERY AND RINSE YOUR MOUTH OUT, NO CHEWING GUM CANDY OR MINTS.     Take these medicines the morning of surgery with A SIP OF WATER: NEXIUM, LIPITOR                                You may not have any metal on your body including hair pins and              piercings  Do not wear jewelry,  lotions, powders or perfumes, deodorant                  Men may shave face and neck.   Do not bring valuables to the hospital. [place].  Contacts, dentures or bridgework may not be worn into surgery.  Leave suitcase in the car. After surgery it may be brought to your room.                  Please read over the following fact sheets you were given: _____________________________________________________________________   [facility] - Preparing for Surgery Before surgery, you can play an important role.  Because skin is not sterile, your skin needs to be as free of germs as possible.  You can reduce the number of germs on your skin by washing with CHG (chlorahexidine gluconate) soap before surgery.  CHG is an antiseptic cleaner which kills germs and bonds with the skin to continue killing germs even after washing. Please DO NOT use if you have an allergy to CHG or antibacterial soaps.  If your skin becomes reddened/irritated stop using the CHG and inform your nurse when you arrive at Short Stay. Do not shave (including legs and underarms) for at least 48 hours prior to the first CHG shower.  You may shave your face/neck. Please follow these instructions carefully:  1.  Shower with CHG Soap the night  before surgery and the  morning of Surgery.  2.  If you choose to wash your hair, wash your hair first as usual with your  normal  shampoo.  3.  After you shampoo, rinse your hair and body thoroughly to remove the  shampoo.                           4.  Use CHG as you would any other liquid soap.  You can apply chg directly  to the skin and wash                       Gently with a scrungie or clean washcloth.  5.  Apply the CHG Soap to your body ONLY FROM THE NECK DOWN.   Do not use on face/ open  Wound or open sores. Avoid contact with eyes, ears mouth and genitals (private parts).                       Wash face,  Genitals (private parts) with your normal soap.             6.  Wash thoroughly, paying special attention to the area where your surgery  will be performed.  7.  Thoroughly rinse your body with warm water from the neck down.  8.  DO NOT shower/wash with your normal soap after using and rinsing off  the CHG Soap.                9.  Pat yourself dry with a clean towel.            10.  Wear clean pajamas.            11.  Place clean sheets on your bed the night of your first shower and do not  sleep with pets. Day of Surgery : Do not apply any lotions/deodorants the morning of surgery.  Please wear clean clothes to the hospital/surgery center.  FAILURE TO FOLLOW THESE INSTRUCTIONS MAY RESULT IN THE CANCELLATION OF YOUR SURGERY PATIENT SIGNATURE_________________________________  NURSE SIGNATURE__________________________________  ________________________________________________________________________  WHAT IS A BLOOD TRANSFUSION? Blood Transfusion Information  A transfusion is the replacement of blood or some of its parts. Blood is made up of multiple cells which provide different functions.  Red blood cells carry oxygen and are used for blood loss replacement.  White blood cells fight against infection.  Platelets control bleeding.  Plasma helps clot  blood.  Other blood products are available for specialized needs, such as hemophilia or other clotting disorders. BEFORE THE TRANSFUSION  Who gives blood for transfusions?   Healthy volunteers who are fully evaluated to make sure their blood is safe. This is blood bank blood. Transfusion therapy is the safest it has ever been in the practice of medicine. Before blood is taken from a donor, a complete history is taken to make sure that person has no history of diseases nor engages in risky social behavior (examples are intravenous drug use or sexual activity with multiple partners). The donor's travel history is screened to minimize risk of transmitting infections, such as malaria. The donated blood is tested for signs of infectious diseases, such as HIV and hepatitis. The blood is then tested to be sure it is compatible with you in order to minimize the chance of a transfusion reaction. If you or a relative donates blood, this is often done in anticipation of surgery and is not appropriate for emergency situations. It takes many days to process the donated blood. RISKS AND COMPLICATIONS Although transfusion therapy is very safe and saves many lives, the main dangers of transfusion include:   Getting an infectious disease.  Developing a transfusion reaction. This is an allergic reaction to something in the blood you were given. Every precaution is taken to prevent this. The decision to have a blood transfusion has been considered carefully by your caregiver before blood is given. Blood is not given unless the benefits outweigh the risks. AFTER THE TRANSFUSION  Right after receiving a blood transfusion, you will usually feel much better and more energetic. This is especially true if your red blood cells have gotten low (anemic). The transfusion raises the level of the red blood cells which carry oxygen, and this usually causes an energy increase.  The  nurse administering the transfusion will monitor  you carefully for complications. HOME CARE INSTRUCTIONS  No special instructions are needed after a transfusion. You may find your energy is better. Speak with your caregiver about any limitations on activity for underlying diseases you may have. SEEK MEDICAL CARE IF:   Your condition is not improving after your transfusion.  You develop redness or irritation at the intravenous (IV) site. SEEK IMMEDIATE MEDICAL CARE IF:  Any of the following symptoms occur over the next 12 hours:  Shaking chills.  You have a temperature by mouth above 102 F (38.9 C), not controlled by medicine.  Chest, back, or muscle pain.  People around you feel you are not acting correctly or are confused.  Shortness of breath or difficulty breathing.  Dizziness and fainting.  You get a rash or develop hives.  You have a decrease in urine output.  Your urine turns a dark color or changes to pink, red, or brown. Any of the following symptoms occur over the next 10 days:  You have a temperature by mouth above 102 F (38.9 C), not controlled by medicine.  Shortness of breath.  Weakness after normal activity.  The white part of the eye turns yellow (jaundice).  You have a decrease in the amount of urine or are urinating less often.  Your urine turns a dark color or changes to pink, red, or brown. Document Released: 08/17/2000 Document Revised: 11/12/2011 Document Reviewed: 04/05/2008 ExitCare Patient Information 2014 [place].  _______________________________________________________________________  Incentive Spirometer  An incentive spirometer is a tool that can help keep your lungs clear and active. This tool measures how well you are filling your lungs with each breath. Taking long deep breaths may help reverse or decrease the chance of developing breathing (pulmonary) problems (especially infection) following:  A long period of time when you are unable to move or be active. BEFORE THE  PROCEDURE   If the spirometer includes an indicator to show your best effort, your nurse or respiratory therapist will set it to a desired goal.  If possible, sit up straight or lean slightly forward. Try not to slouch.  Hold the incentive spirometer in an upright position. INSTRUCTIONS FOR USE  1. Sit on the edge of your bed if possible, or sit up as far as you can in bed or on a chair. 2. Hold the incentive spirometer in an upright position. 3. Breathe out normally. 4. Place the mouthpiece in your mouth and seal your lips tightly around it. 5. Breathe in slowly and as deeply as possible, raising the piston or the ball toward the top of the column. 6. Hold your breath for 3-5 seconds or for as long as possible. Allow the piston or ball to fall to the bottom of the column. 7. Remove the mouthpiece from your mouth and breathe out normally. 8. Rest for a few seconds and repeat Steps 1 through 7 at least 10 times every 1-2 hours when you are awake. Take your time and take a few normal breaths between deep breaths. 9. The spirometer may include an indicator to show your best effort. Use the indicator as a goal to work toward during each repetition. 10. After each set of 10 deep breaths, practice coughing to be sure your lungs are clear. If you have an incision (the cut made at the time of surgery), support your incision when coughing by placing a pillow or rolled up towels firmly against it. Once you are able to get  out of bed, walk around indoors and cough well. You may stop using the incentive spirometer when instructed by your caregiver.  RISKS AND COMPLICATIONS  Take your time so you do not get dizzy or light-headed.  If you are in pain, you may need to take or ask for pain medication before doing incentive spirometry. It is harder to take a deep breath if you are having pain. AFTER USE  Rest and breathe slowly and easily.  It can be helpful to keep track of a log of your progress. Your  caregiver can provide you with a simple table to help with this. If you are using the spirometer at home, follow these instructions: [place] IF:   You are having difficultly using the spirometer.  You have trouble using the spirometer as often as instructed.  Your pain medication is not giving enough relief while using the spirometer.  You develop fever of 100.5 F (38.1 C) or higher. SEEK IMMEDIATE MEDICAL CARE IF:   You cough up bloody sputum that had not been present before.  You develop fever of 102 F (38.9 C) or greater.  You develop worsening pain at or near the incision site. MAKE SURE YOU:   Understand these instructions.  Will watch your condition.  Will get help right away if you are not doing well or get worse. Document Released: 12/31/2006 Document Revised: 11/12/2011 Document Reviewed: 03/03/2007 [employer] Patient Information 2014 [place], [place].   ________________________________________________________________________

## 2018-06-03 NOTE — Progress Notes (Addendum)
Clearance Dr. Debara Pickett 07-10-18 in epic note  Clearance Dr. Lysle Rubens 05-19-18 on chart and LOV 05-19-18   ekg 07-10-17 epic  Echo 07-05-17 epic  LOV DR Lawson Fiscal 12-25-17 in epic

## 2018-06-04 ENCOUNTER — Encounter (HOSPITAL_COMMUNITY)
Admission: RE | Admit: 2018-06-04 | Discharge: 2018-06-04 | Disposition: A | Discharge status: Home / Self Care | Payer: Medicare Other | Source: Ambulatory Visit | Attending: Orthopedic Surgery | Admitting: Orthopedic Surgery

## 2018-06-04 ENCOUNTER — Encounter (HOSPITAL_COMMUNITY): Payer: Self-pay

## 2018-06-04 ENCOUNTER — Other Ambulatory Visit: Payer: Self-pay

## 2018-06-04 DIAGNOSIS — Z79899 Other long term (current) drug therapy: Secondary | ICD-10-CM | POA: Diagnosis not present

## 2018-06-04 DIAGNOSIS — Z7982 Long term (current) use of aspirin: Secondary | ICD-10-CM | POA: Diagnosis not present

## 2018-06-04 DIAGNOSIS — E782 Mixed hyperlipidemia: Secondary | ICD-10-CM | POA: Diagnosis not present

## 2018-06-04 DIAGNOSIS — I10 Essential (primary) hypertension: Secondary | ICD-10-CM | POA: Insufficient documentation

## 2018-06-04 DIAGNOSIS — Z01818 Encounter for other preprocedural examination: Secondary | ICD-10-CM | POA: Diagnosis present

## 2018-06-04 DIAGNOSIS — M1712 Unilateral primary osteoarthritis, left knee: Secondary | ICD-10-CM | POA: Insufficient documentation

## 2018-06-04 HISTORY — DX: Unspecified osteoarthritis, unspecified site: M19.90

## 2018-06-04 HISTORY — DX: Pneumonia, unspecified organism: J18.9

## 2018-06-04 LAB — COMPREHENSIVE METABOLIC PANEL
ALBUMIN: 4.1 g/dL (ref 3.5–5.0)
ALT: 22 U/L (ref 0–44)
AST: 26 U/L (ref 15–41)
Alkaline Phosphatase: 49 U/L (ref 38–126)
Anion gap: 9 (ref 5–15)
BUN: 15 mg/dL (ref 8–23)
CHLORIDE: 96 mmol/L — AB (ref 98–111)
CO2: 30 mmol/L (ref 22–32)
Calcium: 9.5 mg/dL (ref 8.9–10.3)
Creatinine, Ser: 0.84 mg/dL (ref 0.61–1.24)
GFR calc Af Amer: >60 mL/min (ref >60)
GFR calc non Af Amer: >60 mL/min (ref >60)
GLUCOSE: 91 mg/dL (ref 70–99)
Potassium: 4.2 mmol/L (ref 3.5–5.1)
Sodium: 135 mmol/L (ref 135–145)
Total Bilirubin: 1.2 mg/dL (ref 0.3–1.2)
Total Protein: 7.1 g/dL (ref 6.5–8.1)

## 2018-06-04 LAB — CBC
HEMATOCRIT: 38.4 % — AB (ref 39.0–52.0)
Hemoglobin: 13.3 g/dL (ref 13.0–17.0)
MCH: 30.9 pg (ref 26.0–34.0)
MCHC: 34.6 g/dL (ref 30.0–36.0)
MCV: 89.1 fL (ref 78.0–100.0)
PLATELETS: 302 10*3/uL (ref 150–400)
RBC: 4.31 MIL/uL (ref 4.22–5.81)
RDW: 12.7 % (ref 11.5–15.5)
WBC: 6.8 10*3/uL (ref 4.0–10.5)

## 2018-06-04 LAB — ABO/RH: ABO/RH(D): A POS

## 2018-06-04 LAB — SURGICAL PCR SCREEN
MRSA, PCR: NEGATIVE
STAPHYLOCOCCUS AUREUS: NEGATIVE

## 2018-06-04 LAB — PROTIME-INR
INR: 1.03
Prothrombin Time: 13.4 seconds (ref 11.4–15.2)

## 2018-06-04 LAB — APTT: APTT: 34 s (ref 24–36)

## 2018-06-08 MED ORDER — TRANEXAMIC ACID 1000 MG/10ML IV SOLN
2000.0000 mg | INTRAVENOUS | Status: DC
  Filled 2018-06-08: qty 20

## 2018-06-08 MED ORDER — BUPIVACAINE LIPOSOME 1.3 % IJ SUSP
20.0000 mL | Freq: Once | INTRAMUSCULAR | Status: DC
  Filled 2018-06-08: qty 20

## 2018-06-08 NOTE — H&P (Signed)
TOTAL KNEE ADMISSION H&P  Patient is being admitted for left total knee arthroplasty.  Subjective:  Chief Complaint:left knee pain.  HPI: Andrew Bonilla, 84 y.o. male, has a history of pain and functional disability in the left knee due to arthritis and has failed non-surgical conservative treatments for greater than 12 weeks to includeNSAID's and/or analgesics, corticosteriod injections, viscosupplementation injections, flexibility and strengthening excercises, use of assistive devices and activity modification.  Onset of symptoms was gradual, starting >10 years ago with gradually worsening course since that time. The patient noted no past surgery on the left knee(s).  Patient currently rates pain in the left knee(s) at 5 out of 10 with activity. Patient has night pain, worsening of pain with activity and weight bearing, pain that interferes with activities of daily living, pain with passive range of motion, crepitus and joint swelling.  Patient has evidence of subchondral cysts, subchondral sclerosis, periarticular osteophytes and joint space narrowing by imaging studies. There is no active infection.  Patient Active Problem List   Diagnosis Date Noted  . Bilateral carotid artery stenosis 11/30/2016  . Chest wall pain 09/13/2015  . Obesity (BMI 35.0-39.9 without comorbidity) 08/04/2014  . Cough 08/21/2013  . Near syncope 08/21/2013  . Bradycardia 08/21/2013  . Essential hypertension 12/15/2011  . Mixed hyperlipidemia 12/15/2011  . Constipation 12/15/2011  . S/P AVR (aortic valve replacement) 12/15/2011   Past Medical History:  Diagnosis Date  . Arthritis   . Constipation   . Deaf, left    WEARS HEARING AID RIGHT EAR   . GERD (gastroesophageal reflux disease)   . Hard of hearing   . Heart murmur    mod-severe aortic stenosis   . Hyperlipemia   . Hypertension   . Pneumonia    in college  . Prostate enlargement   . Recurrent upper respiratory infection (URI)    12/2011  . S/P  AVR (aortic valve replacement) 01/16/2012   [facility] Ease 30mm pericardial tissue valve    Past Surgical History:  Procedure Laterality Date  . AORTIC VALVE REPLACEMENT  01/17/2012   Procedure: AORTIC VALVE REPLACEMENT (AVR);  Surgeon: Ivin Poot, MD;  Location: [place];  Service: Open Heart Surgery;  Laterality: N/A;  . CARDIAC CATHETERIZATION  12/17/2011   mild nonobstructive CAD - 20% LAD stenosis, 30% OM stenosis, 20% Cfx, 20% RCA lesion  . JOINT REPLACEMENT     Left total knee Dr. Wynelle Link 06-09-18  . LEFT HEART CATHETERIZATION WITH CORONARY ANGIOGRAM N/A 12/17/2011   Procedure: LEFT HEART CATHETERIZATION WITH CORONARY ANGIOGRAM;  Surgeon: Pixie Casino, MD;  Location: [facility] C[facility] LAB;  Service: Cardiovascular;  Laterality: N/A;  . TEE WITHOUT CARDIOVERSION  01/2012   EF 50-55%; calcified AV annulus, severely calcified AV leaflets, mild regurg (AVR)    Current Facility-Administered Medications  Medication Dose Route Frequency Provider Last Rate Last Dose  . [START ON 06/09/2018] bupivacaine liposome (EXPAREL) 1.3 % injection 266 mg  20 mL Other Once Gaynelle Arabian, MD       Current Outpatient Medications  Medication Sig Dispense Refill Last Dose  . amoxicillin (AMOXIL) 500 MG capsule Take 4 capsules (2,000 mg total) by mouth once. TAKE 2 HOURS BEFORE ANY DENTAL PROCEDURE 4 capsule 4 Taking  . aspirin 81 MG tablet Take 81 mg by mouth daily.   Taking  . atorvastatin (LIPITOR) 40 MG tablet Take 40 mg by mouth daily.   Taking  . beta carotene 30 MG capsule Take 30 mg by mouth daily.  Taking  . CALCIUM-MAGNESIUM-ZINC PO Take 1 tablet by mouth daily.     . cholecalciferol (VITAMIN D) 1000 UNITS tablet Take 1,000 Units by mouth daily.   Taking  . Coenzyme Q10 (CO Q 10 PO) Take 1 capsule by mouth daily.    Taking  . esomeprazole (NEXIUM) 40 MG capsule Take 40 mg by mouth 2 (two) times daily before a meal.    Taking  . glucosamine-chondroitin 500-400 MG tablet Take 2 tablets by mouth daily.     Taking  . hydrochlorothiazide (HYDRODIURIL) 25 MG tablet Take 25 mg by mouth daily.   Taking  . irbesartan (AVAPRO) 75 MG tablet Take 1 tablet (75 mg total) by mouth daily. 30 tablet 6   . Multiple Vitamin (MULTIVITAMIN) tablet Take 1 tablet by mouth daily.   Taking  . Omega 3 1000 MG CAPS Take 1,000 mg by mouth daily.    Taking  . Saw Palmetto, Serenoa repens, (SAW PALMETTO PO) Take 2 tablets by mouth daily.    Taking  . TURMERIC PO Take 1 tablet by mouth daily.     . vitamin E 400 UNIT capsule Take 400 Units by mouth daily.   Taking  . benzonatate (TESSALON) 100 MG capsule Take 1 capsule (100 mg total) by mouth 3 (three) times daily as needed for cough. (Patient not taking: Reported on 05/30/2018) 60 capsule 0 Not Taking at Unknown time  . tadalafil (CIALIS) 10 MG tablet Take 10 mg by mouth daily as needed for erectile dysfunction.    Taking   No Known Allergies  Social History   Tobacco Use  . Smoking status: Never Smoker  . Smokeless tobacco: Never Used  Substance Use Topics  . Alcohol use: Not Currently    Alcohol/week: 1.0 - 2.0 standard drinks    Types: 1 - 2 Standard drinks or equivalent per week    Comment: SOCIAL     Family History  Problem Relation Age of Onset  . Coronary artery disease Father 72       MI/smoker  . Colon cancer Brother   . Ovarian cancer Sister   . COPD Sister      Review of Systems  Constitutional: Negative.   HENT: Positive for hearing loss. Negative for congestion, ear discharge, ear pain, nosebleeds, sinus pain, sore throat and tinnitus.   Eyes: Negative.   Respiratory: Positive for shortness of breath. Negative for cough, hemoptysis, sputum production, wheezing and stridor.        SOB with exertion  Cardiovascular: Negative.   Gastrointestinal: Negative.   Genitourinary: Positive for frequency.  Musculoskeletal: Positive for back pain, joint pain and myalgias. Negative for falls and neck pain.  Skin: Negative.   Neurological: Negative.    Endo/Heme/Allergies: Negative.   Psychiatric/Behavioral: Negative.     Objective:  Physical Exam  Constitutional: He is oriented to person, place, and time. He appears well-developed. No distress.  Morbidly obese  HENT:  Head: Normocephalic and atraumatic.  Right Ear: External ear normal.  Left Ear: External ear normal.  Nose: Nose normal.  Mouth/Throat: Oropharynx is clear and moist.  Eyes: Conjunctivae and EOM are normal. Right eye exhibits no discharge. Left eye exhibits no discharge.  Neck: Normal range of motion. Neck supple. No tracheal deviation present. No thyromegaly present.  Cardiovascular: Normal rate, regular rhythm, normal heart sounds and intact distal pulses.  No murmur heard. Respiratory: Effort normal and breath sounds normal. No respiratory distress. He has no wheezes.  GI: Soft. Bowel sounds are  normal. He exhibits no distension. There is no tenderness.  Musculoskeletal:       Right hip: Normal.       Left hip: Normal.  His LEFT knee shows no effusion. Range of motion is 5-125. There is moderate crepitus on range of motion but no tenderness or instability. Gait pattern is normal.  Neurological: He is alert and oriented to person, place, and time. He has normal strength. No sensory deficit.  Skin: No rash noted. He is not diaphoretic. No erythema.  Psychiatric: He has a normal mood and affect. His behavior is normal.    Ht: 5 ft 7 in  Wt: 227 lbs  BMI: 35.6  BP: 138/82 sitting L arm Pulse: 76 bpm regular   Imaging Review Plain radiographs demonstrate severe degenerative joint disease of the left knee(s). The overall alignment ismild varus. The bone quality appears to be good for age and reported activity level.   Preoperative templating of the joint replacement has been completed, documented, and submitted to the Operating Room personnel in order to optimize intra-operative equipment management.   Anticipated LOS equal to or greater than 2 midnights  due to - Age 74 and older with one or more of the following:  - Obesity  - Expected need for hospital services (PT, OT, Nursing) required for safe  discharge  - Anticipated need for postoperative skilled nursing care or inpatient rehab  - Active co-morbidities: Hx aortic stenosis s/p aortic valve replacement 2013 OR   - Unanticipated findings during/Post Surgery: None  - Patient is a high risk of re-admission due to: None     Assessment/Plan:  End stage primary osteoarthritis, left knee   The patient history, physical examination, clinical judgment of the provider and imaging studies are consistent with end stage degenerative joint disease of the left knee(s) and total knee arthroplasty is deemed medically necessary. The treatment options including medical management, injection therapy arthroscopy and arthroplasty were discussed at length. The risks and benefits of total knee arthroplasty were presented and reviewed. The risks due to aseptic loosening, infection, stiffness, patella tracking problems, thromboembolic complications and other imponderables were discussed. The patient acknowledged the explanation, agreed to proceed with the plan and consent was signed. Patient is being admitted for inpatient treatment for surgery, pain control, PT, OT, prophylactic antibiotics, VTE prophylaxis, progressive ambulation and ADL's and discharge planning. The patient is planning to be discharged home.    Therapy Plans: outpatient therapy Disposition: Home with wife DME needed: has equipment PCP: Dr. Deforest Hoyles Cardio: Dr. Osker Mason, PA-C

## 2018-06-09 ENCOUNTER — Encounter (HOSPITAL_COMMUNITY)
Admission: AD | Disposition: A | Discharge status: Home / Self Care | Payer: Self-pay | Source: Ambulatory Visit | Attending: Student

## 2018-06-09 ENCOUNTER — Other Ambulatory Visit: Payer: Self-pay

## 2018-06-09 ENCOUNTER — Observation Stay (HOSPITAL_COMMUNITY)
Admission: AD | Admit: 2018-06-09 | Discharge: 2018-06-11 | Disposition: A | Discharge status: Home / Self Care | Payer: Medicare Other | Source: Ambulatory Visit | Attending: Student | Admitting: Student

## 2018-06-09 ENCOUNTER — Ambulatory Visit (HOSPITAL_COMMUNITY): Payer: Medicare Other | Admitting: Registered Nurse

## 2018-06-09 ENCOUNTER — Encounter (HOSPITAL_COMMUNITY): Payer: Self-pay | Admitting: *Deleted

## 2018-06-09 DIAGNOSIS — Z952 Presence of prosthetic heart valve: Secondary | ICD-10-CM | POA: Diagnosis not present

## 2018-06-09 DIAGNOSIS — Z79899 Other long term (current) drug therapy: Secondary | ICD-10-CM | POA: Insufficient documentation

## 2018-06-09 DIAGNOSIS — M179 Osteoarthritis of knee, unspecified: Secondary | ICD-10-CM | POA: Diagnosis present

## 2018-06-09 DIAGNOSIS — Z6835 Body mass index (BMI) 35.0-35.9, adult: Secondary | ICD-10-CM | POA: Insufficient documentation

## 2018-06-09 DIAGNOSIS — E785 Hyperlipidemia, unspecified: Secondary | ICD-10-CM | POA: Insufficient documentation

## 2018-06-09 DIAGNOSIS — K219 Gastro-esophageal reflux disease without esophagitis: Secondary | ICD-10-CM | POA: Diagnosis not present

## 2018-06-09 DIAGNOSIS — M1712 Unilateral primary osteoarthritis, left knee: Secondary | ICD-10-CM | POA: Diagnosis not present

## 2018-06-09 DIAGNOSIS — Z7982 Long term (current) use of aspirin: Secondary | ICD-10-CM | POA: Diagnosis not present

## 2018-06-09 DIAGNOSIS — E669 Obesity, unspecified: Secondary | ICD-10-CM | POA: Diagnosis not present

## 2018-06-09 DIAGNOSIS — I10 Essential (primary) hypertension: Secondary | ICD-10-CM | POA: Diagnosis not present

## 2018-06-09 DIAGNOSIS — M1710 Unilateral primary osteoarthritis, unspecified knee: Secondary | ICD-10-CM | POA: Diagnosis present

## 2018-06-09 HISTORY — PX: TOTAL KNEE ARTHROPLASTY: SHX125

## 2018-06-09 LAB — TYPE AND SCREEN
ABO/RH(D): A POS
ANTIBODY SCREEN: NEGATIVE

## 2018-06-09 SURGERY — ARTHROPLASTY, KNEE, TOTAL
Anesthesia: Spinal | Site: Knee | Laterality: Left

## 2018-06-09 MED ORDER — RIVAROXABAN 10 MG PO TABS
10.0000 mg | ORAL_TABLET | Freq: Every day | ORAL | Status: DC
  Administered 2018-06-10 – 2018-06-11 (×2): 10 mg via ORAL
  Filled 2018-06-09 (×2): qty 1

## 2018-06-09 MED ORDER — ACETAMINOPHEN 500 MG PO TABS
1000.0000 mg | ORAL_TABLET | Freq: Four times a day (QID) | ORAL | Status: AC
  Administered 2018-06-09 – 2018-06-10 (×4): 1000 mg via ORAL
  Filled 2018-06-09 (×4): qty 2

## 2018-06-09 MED ORDER — MIDAZOLAM HCL 2 MG/2ML IJ SOLN: 1.0000 mg | INTRAMUSCULAR | Status: DC

## 2018-06-09 MED ORDER — PHENOL 1.4 % MT LIQD: 1.0000 | OROMUCOSAL | Status: DC | PRN

## 2018-06-09 MED ORDER — METOCLOPRAMIDE HCL 5 MG PO TABS: 5.0000 mg | ORAL_TABLET | Freq: Three times a day (TID) | ORAL | Status: DC | PRN

## 2018-06-09 MED ORDER — DIPHENHYDRAMINE HCL 12.5 MG/5ML PO ELIX: 12.5000 mg | ORAL_SOLUTION | ORAL | Status: DC | PRN

## 2018-06-09 MED ORDER — SODIUM CHLORIDE 0.9 % IJ SOLN
INTRAMUSCULAR | Status: AC
  Filled 2018-06-09: qty 10

## 2018-06-09 MED ORDER — OXYCODONE HCL 5 MG PO TABS: 10.0000 mg | ORAL_TABLET | ORAL | Status: DC | PRN

## 2018-06-09 MED ORDER — ACETAMINOPHEN 10 MG/ML IV SOLN
1000.0000 mg | Freq: Four times a day (QID) | INTRAVENOUS | Status: DC
  Administered 2018-06-09: 1000 mg via INTRAVENOUS
  Filled 2018-06-09: qty 100

## 2018-06-09 MED ORDER — DEXAMETHASONE SODIUM PHOSPHATE 10 MG/ML IJ SOLN
8.0000 mg | Freq: Once | INTRAMUSCULAR | Status: AC
  Administered 2018-06-09: 8 mg via INTRAVENOUS

## 2018-06-09 MED ORDER — TRANEXAMIC ACID 1000 MG/10ML IV SOLN
1000.0000 mg | Freq: Once | INTRAVENOUS | Status: AC
  Administered 2018-06-09: 1000 mg via INTRAVENOUS
  Filled 2018-06-09: qty 1000

## 2018-06-09 MED ORDER — SODIUM CHLORIDE 0.9 % IJ SOLN
INTRAMUSCULAR | Status: AC
  Filled 2018-06-09: qty 50

## 2018-06-09 MED ORDER — METHOCARBAMOL 500 MG PO TABS: 500.0000 mg | ORAL_TABLET | Freq: Four times a day (QID) | ORAL | Status: DC | PRN

## 2018-06-09 MED ORDER — IRBESARTAN 75 MG PO TABS
75.0000 mg | ORAL_TABLET | Freq: Every day | ORAL | Status: DC
  Administered 2018-06-10 – 2018-06-11 (×2): 75 mg via ORAL
  Filled 2018-06-09 (×2): qty 1

## 2018-06-09 MED ORDER — POLYETHYLENE GLYCOL 3350 17 G PO PACK
17.0000 g | PACK | Freq: Every day | ORAL | Status: DC | PRN
  Administered 2018-06-10 – 2018-06-11 (×2): 17 g via ORAL
  Filled 2018-06-09 (×2): qty 1

## 2018-06-09 MED ORDER — HYDROCHLOROTHIAZIDE 25 MG PO TABS
25.0000 mg | ORAL_TABLET | Freq: Every day | ORAL | Status: DC
  Administered 2018-06-10 – 2018-06-11 (×2): 25 mg via ORAL
  Filled 2018-06-09 (×2): qty 1

## 2018-06-09 MED ORDER — FENTANYL CITRATE (PF) 100 MCG/2ML IJ SOLN
50.0000 ug | INTRAMUSCULAR | Status: DC
  Administered 2018-06-09: 25 ug via INTRAVENOUS

## 2018-06-09 MED ORDER — ONDANSETRON HCL 4 MG/2ML IJ SOLN
INTRAMUSCULAR | Status: AC
  Filled 2018-06-09: qty 2

## 2018-06-09 MED ORDER — METOCLOPRAMIDE HCL 5 MG/ML IJ SOLN: 5.0000 mg | Freq: Three times a day (TID) | INTRAMUSCULAR | Status: DC | PRN

## 2018-06-09 MED ORDER — SODIUM CHLORIDE 0.9 % IR SOLN
Status: DC | PRN
  Administered 2018-06-09: 1000 mL

## 2018-06-09 MED ORDER — STERILE WATER FOR IRRIGATION IR SOLN
Status: DC | PRN
  Administered 2018-06-09: 2000 mL

## 2018-06-09 MED ORDER — BUPIVACAINE IN DEXTROSE 0.75-8.25 % IT SOLN
INTRATHECAL | Status: DC | PRN
  Administered 2018-06-09: 1.8 mL via INTRATHECAL

## 2018-06-09 MED ORDER — HYDROMORPHONE HCL 1 MG/ML IJ SOLN
0.5000 mg | INTRAMUSCULAR | Status: DC | PRN
  Administered 2018-06-11: 1 mg via INTRAVENOUS
  Filled 2018-06-09: qty 1

## 2018-06-09 MED ORDER — LACTATED RINGERS IV SOLN
INTRAVENOUS | Status: DC
  Administered 2018-06-09: 09:00:00 via INTRAVENOUS

## 2018-06-09 MED ORDER — PROPOFOL 500 MG/50ML IV EMUL
INTRAVENOUS | Status: DC | PRN
  Administered 2018-06-09: 40 ug/kg/min via INTRAVENOUS

## 2018-06-09 MED ORDER — SODIUM CHLORIDE 0.9 % IJ SOLN
INTRAMUSCULAR | Status: DC | PRN
  Administered 2018-06-09: 60 mL

## 2018-06-09 MED ORDER — CEFAZOLIN SODIUM-DEXTROSE 2-4 GM/100ML-% IV SOLN
2.0000 g | INTRAVENOUS | Status: AC
  Administered 2018-06-09: 2 g via INTRAVENOUS
  Filled 2018-06-09: qty 100

## 2018-06-09 MED ORDER — ROPIVACAINE HCL 7.5 MG/ML IJ SOLN
INTRAMUSCULAR | Status: DC | PRN
  Administered 2018-06-09: 20 mL via PERINEURAL

## 2018-06-09 MED ORDER — PANTOPRAZOLE SODIUM 40 MG PO TBEC
80.0000 mg | DELAYED_RELEASE_TABLET | Freq: Two times a day (BID) | ORAL | Status: DC
  Administered 2018-06-09 – 2018-06-11 (×4): 80 mg via ORAL
  Filled 2018-06-09 (×5): qty 2

## 2018-06-09 MED ORDER — BISACODYL 10 MG RE SUPP: 10.0000 mg | Freq: Every day | RECTAL | Status: DC | PRN

## 2018-06-09 MED ORDER — ATORVASTATIN CALCIUM 40 MG PO TABS
40.0000 mg | ORAL_TABLET | Freq: Every day | ORAL | Status: DC
  Administered 2018-06-10 – 2018-06-11 (×2): 40 mg via ORAL
  Filled 2018-06-09 (×2): qty 1

## 2018-06-09 MED ORDER — ONDANSETRON HCL 4 MG/2ML IJ SOLN: 4.0000 mg | Freq: Four times a day (QID) | INTRAMUSCULAR | Status: DC | PRN

## 2018-06-09 MED ORDER — DEXAMETHASONE SODIUM PHOSPHATE 10 MG/ML IJ SOLN
10.0000 mg | Freq: Once | INTRAMUSCULAR | Status: AC
  Administered 2018-06-10: 10 mg via INTRAVENOUS
  Filled 2018-06-09: qty 1

## 2018-06-09 MED ORDER — BUPIVACAINE LIPOSOME 1.3 % IJ SUSP
INTRAMUSCULAR | Status: DC | PRN
  Administered 2018-06-09: 20 mL

## 2018-06-09 MED ORDER — DEXAMETHASONE SODIUM PHOSPHATE 10 MG/ML IJ SOLN
INTRAMUSCULAR | Status: AC
  Filled 2018-06-09: qty 1

## 2018-06-09 MED ORDER — FLEET ENEMA 7-19 GM/118ML RE ENEM: 1.0000 | ENEMA | Freq: Once | RECTAL | Status: DC | PRN

## 2018-06-09 MED ORDER — PROPOFOL 10 MG/ML IV BOLUS
INTRAVENOUS | Status: AC
  Filled 2018-06-09: qty 40

## 2018-06-09 MED ORDER — ONDANSETRON HCL 4 MG PO TABS: 4.0000 mg | ORAL_TABLET | Freq: Four times a day (QID) | ORAL | Status: DC | PRN

## 2018-06-09 MED ORDER — OXYCODONE HCL 5 MG PO TABS
5.0000 mg | ORAL_TABLET | ORAL | Status: DC | PRN
  Administered 2018-06-09 – 2018-06-10 (×8): 5 mg via ORAL
  Administered 2018-06-11 (×3): 10 mg via ORAL
  Filled 2018-06-09 (×3): qty 1
  Filled 2018-06-09: qty 2
  Filled 2018-06-09 (×2): qty 1
  Filled 2018-06-09: qty 2
  Filled 2018-06-09 (×2): qty 1
  Filled 2018-06-09: qty 2
  Filled 2018-06-09: qty 1

## 2018-06-09 MED ORDER — METHOCARBAMOL 500 MG IVPB - SIMPLE MED
500.0000 mg | Freq: Four times a day (QID) | INTRAVENOUS | Status: DC | PRN
  Filled 2018-06-09: qty 50

## 2018-06-09 MED ORDER — TRANEXAMIC ACID 1000 MG/10ML IV SOLN
1000.0000 mg | INTRAVENOUS | Status: AC
  Administered 2018-06-09: 1000 mg via INTRAVENOUS
  Filled 2018-06-09: qty 10

## 2018-06-09 MED ORDER — MENTHOL 3 MG MT LOZG: 1.0000 | LOZENGE | OROMUCOSAL | Status: DC | PRN

## 2018-06-09 MED ORDER — EPHEDRINE SULFATE-NACL 50-0.9 MG/10ML-% IV SOSY
PREFILLED_SYRINGE | INTRAVENOUS | Status: DC | PRN
  Administered 2018-06-09 (×2): 5 mg via INTRAVENOUS

## 2018-06-09 MED ORDER — SODIUM CHLORIDE 0.9 % IV SOLN
INTRAVENOUS | Status: DC
  Administered 2018-06-09 – 2018-06-10 (×2): via INTRAVENOUS

## 2018-06-09 MED ORDER — CHLORHEXIDINE GLUCONATE 4 % EX LIQD: 60.0000 mL | Freq: Once | CUTANEOUS | Status: DC

## 2018-06-09 MED ORDER — ONDANSETRON HCL 4 MG/2ML IJ SOLN
INTRAMUSCULAR | Status: DC | PRN
  Administered 2018-06-09: 4 mg via INTRAVENOUS

## 2018-06-09 MED ORDER — CEFAZOLIN SODIUM-DEXTROSE 2-4 GM/100ML-% IV SOLN
2.0000 g | Freq: Four times a day (QID) | INTRAVENOUS | Status: AC
  Administered 2018-06-09 (×2): 2 g via INTRAVENOUS
  Filled 2018-06-09 (×2): qty 100

## 2018-06-09 MED ORDER — FENTANYL CITRATE (PF) 100 MCG/2ML IJ SOLN
INTRAMUSCULAR | Status: AC
  Administered 2018-06-09: 25 ug via INTRAVENOUS
  Filled 2018-06-09: qty 2

## 2018-06-09 MED ORDER — DOCUSATE SODIUM 100 MG PO CAPS
100.0000 mg | ORAL_CAPSULE | Freq: Two times a day (BID) | ORAL | Status: DC
  Administered 2018-06-09 – 2018-06-11 (×4): 100 mg via ORAL
  Filled 2018-06-09 (×4): qty 1

## 2018-06-09 MED ORDER — FENTANYL CITRATE (PF) 100 MCG/2ML IJ SOLN: 25.0000 ug | INTRAMUSCULAR | Status: DC | PRN

## 2018-06-09 SURGICAL SUPPLY — 55 items
BAG SPEC THK2 15X12 ZIP CLS (MISCELLANEOUS) ×1
BAG ZIPLOCK 12X15 (MISCELLANEOUS) ×2 IMPLANT
BANDAGE ACE 6X5 VEL STRL LF (GAUZE/BANDAGES/DRESSINGS) ×2 IMPLANT
BLADE SAG 18X100X1.27 (BLADE) ×2 IMPLANT
BLADE SAW SGTL 11.0X1.19X90.0M (BLADE) ×2 IMPLANT
BOWL SMART MIX CTS (DISPOSABLE) ×2 IMPLANT
CEMENT HV SMART SET (Cement) ×4 IMPLANT
CEMENT TIBIA MBT SIZE 4 (Knees) IMPLANT
COVER SURGICAL LIGHT HANDLE (MISCELLANEOUS) ×2 IMPLANT
CUFF TOURN SGL QUICK 34 (TOURNIQUET CUFF) ×2
CUFF TRNQT CYL 34X4X40X1 (TOURNIQUET CUFF) ×1 IMPLANT
DECANTER SPIKE VIAL GLASS SM (MISCELLANEOUS) ×2 IMPLANT
DRAPE U-SHAPE 47X51 STRL (DRAPES) ×2 IMPLANT
DRSG ADAPTIC 3X8 NADH LF (GAUZE/BANDAGES/DRESSINGS) ×2 IMPLANT
DRSG PAD ABDOMINAL 8X10 ST (GAUZE/BANDAGES/DRESSINGS) ×2 IMPLANT
DURAPREP 26ML APPLICATOR (WOUND CARE) ×2 IMPLANT
ELECT REM PT RETURN 15FT ADLT (MISCELLANEOUS) ×2 IMPLANT
EVACUATOR 1/8 PVC DRAIN (DRAIN) ×2 IMPLANT
FEMUR SIGMA PS SZ 4.0 L (Knees) ×1 IMPLANT
GAUZE SPONGE 4X4 12PLY STRL (GAUZE/BANDAGES/DRESSINGS) ×2 IMPLANT
GLOVE BIO SURGEON STRL SZ7 (GLOVE) ×1 IMPLANT
GLOVE BIO SURGEON STRL SZ8 (GLOVE) ×2 IMPLANT
GLOVE BIOGEL PI IND STRL 6.5 (GLOVE) ×1 IMPLANT
GLOVE BIOGEL PI IND STRL 7.0 (GLOVE) ×1 IMPLANT
GLOVE BIOGEL PI IND STRL 8 (GLOVE) ×1 IMPLANT
GLOVE BIOGEL PI INDICATOR 6.5 (GLOVE) ×7
GLOVE BIOGEL PI INDICATOR 7.0 (GLOVE)
GLOVE BIOGEL PI INDICATOR 8 (GLOVE) ×1
GLOVE SURG SS PI 6.5 STRL IVOR (GLOVE) ×2 IMPLANT
GOWN STRL REUS W/TWL LRG LVL3 (GOWN DISPOSABLE) ×4 IMPLANT
GOWN STRL REUS W/TWL XL LVL3 (GOWN DISPOSABLE) ×2 IMPLANT
HANDPIECE INTERPULSE COAX TIP (DISPOSABLE) ×2
HOLDER FOLEY CATH W/STRAP (MISCELLANEOUS) IMPLANT
IMMOBILIZER KNEE 20 (SOFTGOODS) ×2
IMMOBILIZER KNEE 20 THIGH 36 (SOFTGOODS) ×1 IMPLANT
MANIFOLD NEPTUNE II (INSTRUMENTS) ×2 IMPLANT
NS IRRIG 1000ML POUR BTL (IV SOLUTION) ×2 IMPLANT
PACK TOTAL KNEE CUSTOM (KITS) ×2 IMPLANT
PADDING CAST COTTON 6X4 STRL (CAST SUPPLIES) ×4 IMPLANT
PATELLA DOME PFC 41MM (Knees) ×1 IMPLANT
PIN STEINMAN FIXATION KNEE (PIN) ×1 IMPLANT
PLATE ROT INSERT 15MM SIZE 4 (Plate) ×1 IMPLANT
POSITIONER SURGICAL ARM (MISCELLANEOUS) ×2 IMPLANT
SET HNDPC FAN SPRY TIP SCT (DISPOSABLE) ×1 IMPLANT
STRIP CLOSURE SKIN 1/2X4 (GAUZE/BANDAGES/DRESSINGS) ×3 IMPLANT
SUT MNCRL AB 4-0 PS2 18 (SUTURE) ×2 IMPLANT
SUT STRATAFIX 0 PDS 27 VIOLET (SUTURE) ×2
SUT VIC AB 2-0 CT1 27 (SUTURE) ×6
SUT VIC AB 2-0 CT1 TAPERPNT 27 (SUTURE) ×3 IMPLANT
SUTURE STRATFX 0 PDS 27 VIOLET (SUTURE) ×1 IMPLANT
TIBIA MBT CEMENT SIZE 4 (Knees) ×2 IMPLANT
TRAY FOLEY MTR SLVR 16FR STAT (SET/KITS/TRAYS/PACK) ×2 IMPLANT
WATER STERILE IRR 1000ML POUR (IV SOLUTION) ×4 IMPLANT
WRAP KNEE MAXI GEL POST OP (GAUZE/BANDAGES/DRESSINGS) ×2 IMPLANT
YANKAUER SUCT BULB TIP 10FT TU (MISCELLANEOUS) ×2 IMPLANT

## 2018-06-09 NOTE — Progress Notes (Signed)
Assisted Dr. Hollis with left, ultrasound guided, adductor canal block. Side rails up, monitors on throughout procedure. See vital signs in flow sheet. Tolerated Procedure well.  

## 2018-06-09 NOTE — Evaluation (Signed)
Physical Therapy Evaluation Patient Details Name: Andrew Bonilla MRN: [id number] DOB: 1933/03/10 Today's Date: 06/09/2018   History of Present Illness  LTKA, H/O AVR.  Clinical Impression  The patient ambulated  X 45 '. Patient plans DC home with OPPT. Pt admitted with above diagnosis. Pt currently with functional limitations due to the deficits listed below (see PT Problem List).  Pt will benefit from skilled PT to increase their independence and safety with mobility to allow discharge to the venue listed below.        Follow Up Recommendations Outpatient PT;Follow surgeon's recommendation for DC plan and follow-up therapies    Equipment Recommendations  (may need 2  wheeled RW)    Recommendations for Other Services       Precautions / Restrictions Precautions Precautions: Knee;Fall Required Braces or Orthoses: Knee Immobilizer - Left Knee Immobilizer - Left: Discontinue post op day 2      Mobility  Bed Mobility Overal bed mobility: Needs Assistance Bed Mobility: Supine to Sit     Supine to sit: Min guard     General bed mobility comments: verbal cues for technique, HOB raised.  Transfers Overall transfer level: Needs assistance Equipment used: Rolling walker (2 wheeled) Transfers: Sit to/from Stand Sit to Stand: Min assist         General transfer comment: cues for safety and hand position and left leg  Ambulation/Gait Ambulation/Gait assistance: Min assist;+2 safety/equipment Gait Distance (Feet): 45 Feet Assistive device: Rolling walker (2 wheeled) Gait Pattern/deviations: Step-to pattern;Step-through pattern     General Gait Details: cues for sequence and posture  Stairs            Wheelchair Mobility    Modified Rankin (Stroke Patients Only)       Balance                                             Pertinent Vitals/Pain Pain Assessment: 0-10 Faces Pain Scale: Hurts a little bit Pain Location: left knee Pain  Descriptors / Indicators: Aching Pain Intervention(s): Monitored during session;Patient requesting pain meds-RN notified;Ice applied    Home Living Family/patient expects to be discharged to:: Private residence Living Arrangements: Spouse/significant other Available Help at Discharge: Family Type of Home: House Home Access: Stairs to enter Entrance Stairs-Rails: None Entrance Stairs-Number of Steps: 2 Home Layout: Multi-level;Able to live on main level with bedroom/bathroom Home Equipment: Gilford Rile - 4 wheels Additional Comments: 3 wheeled     Prior Function Level of Independence: Independent               Hand Dominance   Dominant Hand: Right    Extremity/Trunk Assessment   Upper Extremity Assessment Upper Extremity Assessment: Defer to OT evaluation    Lower Extremity Assessment Lower Extremity Assessment: LLE deficits/detail LLE Deficits / Details: performs SLR    Cervical / Trunk Assessment Cervical / Trunk Assessment: Normal  Communication   Communication: No difficulties  Cognition Arousal/Alertness: Awake/alert Behavior During Therapy: WFL for tasks assessed/performed Overall Cognitive Status: Within Functional Limits for tasks assessed                                        General Comments      Exercises     Assessment/Plan    PT Assessment Patient  needs continued PT services  PT Problem List Decreased strength;Decreased range of motion;Decreased activity tolerance;Decreased mobility;Decreased knowledge of precautions;Decreased safety awareness;Decreased knowledge of use of DME;Pain       PT Treatment Interventions DME instruction;Therapeutic exercise;Gait training;Stair training;Functional mobility training;Therapeutic activities;Patient/family education    PT Goals (Current goals can be found in the Care Plan section)  Acute Rehab PT Goals Patient Stated Goal: to get up. play tennis PT Goal Formulation: With  patient/family Time For Goal Achievement: 06/13/18 Potential to Achieve Goals: Good    Frequency 7X/week   Barriers to discharge        Co-evaluation               AM-PAC PT "6 Clicks" Daily Activity  Outcome Measure Difficulty turning over in bed (including adjusting bedclothes, sheets and blankets)?: A Little Difficulty moving from lying on back to sitting on the side of the bed? : A Little Difficulty sitting down on and standing up from a chair with arms (e.g., wheelchair, bedside commode, etc,.)?: A Lot Help needed moving to and from a bed to chair (including a wheelchair)?: A Lot Help needed walking in hospital room?: A Lot Help needed climbing 3-5 steps with a railing? : Total 6 Click Score: 13    End of Session Equipment Utilized During Treatment: Gait belt;Left knee immobilizer Activity Tolerance: Patient tolerated treatment well Patient left: in chair;with call bell/phone within reach;with family/visitor present Nurse Communication: Patient requests pain meds;Mobility status PT Visit Diagnosis: Unsteadiness on feet (R26.81);Pain Pain - Right/Left: Left Pain - part of body: Knee    Time: 1534-1600 PT Time Calculation (min) (ACUTE ONLY): 26 min   Charges:   PT Evaluation $PT Eval Low Complexity: 1 Low PT Treatments $Gait Training: 8-22 mins        Andrew Bonilla PT Acute Rehabilitation Services Pager [phone] Office [phone]   Claretha Cooper 06/09/2018, 4:29 PM

## 2018-06-09 NOTE — Anesthesia Procedure Notes (Signed)
Anesthesia Regional Block: Adductor canal block   Pre-Anesthetic Checklist: ,, timeout performed, Correct Patient, Correct Site, Correct Laterality, Correct Procedure, Correct Position, site marked, Risks and benefits discussed,  Surgical consent,  Pre-op evaluation,  At surgeon's request and post-op pain management  Laterality: Left  Prep: chloraprep       Needles:  Injection technique: Single-shot  Needle Type: Echogenic Stimulator Needle     Needle Length: 9cm  Needle Gauge: 21     Additional Needles:   Procedures:,,,, ultrasound used (permanent image in chart),,,,  Narrative:  Start time: 06/09/2018 9:55 AM End time: 06/09/2018 10:00 AM Injection made incrementally with aspirations every 5 mL.  Performed by: Personally  Anesthesiologist: Effie Berkshire, MD  Additional Notes: Patient tolerated the procedure well. Local anesthetic introduced in an incremental fashion under minimal resistance after negative aspirations. No paresthesias were elicited. After completion of the procedure, no acute issues were identified and patient continued to be monitored by RN.

## 2018-06-09 NOTE — Anesthesia Preprocedure Evaluation (Addendum)
Anesthesia Evaluation  Patient identified by MRN, date of birth, ID band Patient awake    Reviewed: Allergy & Precautions, NPO status , Patient's Chart, lab work & pertinent test results  Airway Mallampati: II  TM Distance: >3 FB Neck ROM: Full    Dental  (+) Teeth Intact, Dental Advisory Given   Pulmonary    breath sounds clear to auscultation       Cardiovascular hypertension, Pt. on medications + Valvular Problems/Murmurs  Rhythm:Regular Rate:Normal     Neuro/Psych negative neurological ROS     GI/Hepatic Neg liver ROS, GERD  Medicated,  Endo/Other  negative endocrine ROS  Renal/GU negative Renal ROS     Musculoskeletal  (+) Arthritis , Osteoarthritis,    Abdominal (+) + obese,   Peds  Hematology negative hematology ROS (+)   Anesthesia Other Findings - HLD  Reproductive/Obstetrics                           Lab Results  Component Value Date   WBC 6.8 06/04/2018   HGB 13.3 06/04/2018   HCT 38.4 (L) 06/04/2018   MCV 89.1 06/04/2018   PLT 302 06/04/2018   Lab Results  Component Value Date   INR 1.03 06/04/2018   INR 1.81 (H) 01/17/2012   INR 1.09 01/14/2012   Echo: - Left ventricle: The cavity size was normal. There was mild   concentric hypertrophy. Systolic function was normal. The   estimated ejection fraction was in the range of 60% to 65%. Wall   motion was normal; there were no regional wall motion   abnormalities. Doppler parameters are consistent with abnormal   left ventricular relaxation (grade 1 diastolic dysfunction).   Doppler parameters are consistent with elevated ventricular   end-diastolic filling pressure. - Aortic valve: Mean gradient (S): 14 mm Hg. Peak gradient (S): 22   mm Hg. - Mitral valve: Calcified annulus. Moderately thickened, moderately   calcified leaflets . There was trivial regurgitation. - Left atrium: The atrium was moderately dilated. -  Right ventricle: The cavity size was normal. Wall thickness was   normal. Systolic function was normal. - Tricuspid valve: There was mild regurgitation. - Pericardium, extracardiac: A trivial pericardial effusion was   identified posterior to the heart. Features were not consistent   with tamponade physiology.   Anesthesia Physical Anesthesia Plan  ASA: III  Anesthesia Plan: Spinal   Post-op Pain Management:  Regional for Post-op pain   Induction: Intravenous  PONV Risk Score and Plan: 2 and Ondansetron and Propofol infusion  Airway Management Planned: Natural Airway and Simple Face Mask  Additional Equipment: None  Intra-op Plan:   Post-operative Plan:   Informed Consent: I have reviewed the patients History and Physical, chart, labs and discussed the procedure including the risks, benefits and alternatives for the proposed anesthesia with the patient or authorized representative who has indicated his/her understanding and acceptance.     Plan Discussed with: CRNA  Anesthesia Plan Comments:        Anesthesia Quick Evaluation

## 2018-06-09 NOTE — Interval H&P Note (Signed)
History and Physical Interval Note:  06/09/2018 8:22 AM  Andrew Bonilla  has presented today for surgery, with the diagnosis of left knee osteoarthritis  The various methods of treatment have been discussed with the patient and family. After consideration of risks, benefits and other options for treatment, the patient has consented to  Procedure(s): LEFT TOTAL KNEE ARTHROPLASTY (Left) as a surgical intervention .  The patient's history has been reviewed, patient examined, no change in status, stable for surgery.  I have reviewed the patient's chart and labs.  Questions were answered to the patient's satisfaction.     [name] Maleyah Evans

## 2018-06-09 NOTE — Plan of Care (Signed)
Plan of care 

## 2018-06-09 NOTE — Anesthesia Postprocedure Evaluation (Signed)
Anesthesia Post Note  Patient: Andrew Bonilla  Procedure(s) Performed: LEFT TOTAL KNEE ARTHROPLASTY (Left Knee)     Patient location during evaluation: PACU Anesthesia Type: Spinal Level of consciousness: oriented and awake and alert Pain management: pain level controlled Vital Signs Assessment: post-procedure vital signs reviewed and stable Respiratory status: spontaneous breathing, respiratory function stable and patient connected to nasal cannula oxygen Cardiovascular status: blood pressure returned to baseline and stable Postop Assessment: no headache, no backache, no apparent nausea or vomiting, spinal receding and patient able to bend at knees Anesthetic complications: no    Last Vitals:  Vitals:   06/09/18 1315 06/09/18 1330  BP: (!) 146/69 140/70  Pulse: (!) 57 63  Resp: 17 16  Temp:  36.7 C  SpO2: 100% 100%    Last Pain:  Vitals:   06/09/18 1330  TempSrc:   PainSc: 0-No pain                 Effie Berkshire

## 2018-06-09 NOTE — Discharge Instructions (Addendum)
° °Dr. Frank Aluisio °Total Joint Specialist °[facility] °[address] °[place], [place] [address] °[phone] ° °TOTAL KNEE REPLACEMENT POSTOPERATIVE DIRECTIONS ° °Knee Rehabilitation, Guidelines Following Surgery  °Results after knee surgery are often greatly improved when you follow the exercise, range of motion and muscle strengthening exercises prescribed by your doctor. Safety measures are also important to protect the knee from further injury. Any time any of these exercises cause you to have increased pain or swelling in your knee joint, decrease the amount until you are comfortable again and slowly increase them. If you have problems or questions, call your caregiver or physical therapist for advice.  ° °HOME CARE INSTRUCTIONS  °Remove items at home which could result in a fall. This includes throw rugs or furniture in walking pathways.  °· ICE to the affected knee every three hours for 30 minutes at a time and then as needed for pain and swelling.  Continue to use ice on the knee for pain and swelling from surgery. You may notice swelling that will progress down to the foot and ankle.  This is normal after surgery.  Elevate the leg when you are not up walking on it.   °· Continue to use the breathing machine which will help keep your temperature down.  It is common for your temperature to cycle up and down following surgery, especially at night when you are not up moving around and exerting yourself.  The breathing machine keeps your lungs expanded and your temperature down. °· Do not place pillow under knee, focus on keeping the knee straight while resting ° °DIET °You may resume your previous home diet once your are discharged from the hospital. ° °DRESSING / WOUND CARE / SHOWERING °You may change your dressing every day with sterile gauze.  Please use good hand washing techniques before changing the dressing.  Do not use any lotions or creams on the incision until instructed by your  surgeon. °You may start showering once you are discharged home but do not submerge the incision under water. Just pat the incision dry and apply a dry gauze dressing on daily. °Change the surgical dressing daily and reapply a dry dressing each time. ° °ACTIVITY °Walk with your walker as instructed. °Use walker as long as suggested by your caregivers. °Avoid periods of inactivity such as sitting longer than an hour when not asleep. This helps prevent blood clots.  °You may resume a sexual relationship in one month or when given the OK by your doctor.  °You may return to work once you are cleared by your doctor.  °Do not drive a car for 6 weeks or until released by you surgeon.  °Do not drive while taking narcotics. ° °WEIGHT BEARING °Weight bearing as tolerated with assist device (walker, cane, etc) as directed, use it as long as suggested by your surgeon or therapist, typically at least 4-6 weeks. ° °POSTOPERATIVE CONSTIPATION PROTOCOL °Constipation - defined medically as fewer than three stools per week and severe constipation as less than one stool per week. ° °One of the most common issues patients have following surgery is constipation.  Even if you have a regular bowel pattern at home, your normal regimen is likely to be disrupted due to multiple reasons following surgery.  Combination of anesthesia, postoperative narcotics, change in appetite and fluid intake all can affect your bowels.  In order to avoid complications following surgery, here are some recommendations in order to help you during your recovery period. ° °  Colace (docusate) - Pick up an over-the-counter form of Colace or another stool softener and take twice a day as long as you are requiring postoperative pain medications.  Take with a full glass of water daily.  If you experience loose stools or diarrhea, hold the colace until you stool forms back up.  If your symptoms do not get better within 1 week or if they get worse, check with your  doctor. ° °Dulcolax (bisacodyl) - Pick up over-the-counter and take as directed by the product packaging as needed to assist with the movement of your bowels.  Take with a full glass of water.  Use this product as needed if not relieved by Colace only.  ° °MiraLax (polyethylene glycol) - Pick up over-the-counter to have on hand.  MiraLax is a solution that will increase the amount of water in your bowels to assist with bowel movements.  Take as directed and can mix with a glass of water, juice, soda, coffee, or tea.  Take if you go more than two days without a movement. °Do not use MiraLax more than once per day. Call your doctor if you are still constipated or irregular after using this medication for 7 days in a row. ° °If you continue to have problems with postoperative constipation, please contact the office for further assistance and recommendations.  If you experience "the worst abdominal pain ever" or develop nausea or vomiting, please contact the office immediatly for further recommendations for treatment. ° °ITCHING ° If you experience itching with your medications, try taking only a single pain pill, or even half a pain pill at a time.  You can also use Benadryl over the counter for itching or also to help with sleep.  ° °TED HOSE STOCKINGS °Wear the elastic stockings on both legs for three weeks following surgery during the day but you may remove then at night for sleeping. ° °MEDICATIONS °See your medication summary on the “After Visit Summary” that the nursing staff will review with you prior to discharge.  You may have some home medications which will be placed on hold until you complete the course of blood thinner medication.  It is important for you to complete the blood thinner medication as prescribed by your surgeon.  Continue your approved medications as instructed at time of discharge. ° °PRECAUTIONS °If you experience chest pain or shortness of breath - call 911 immediately for transfer to the  hospital emergency department.  °If you develop a fever greater that 101 F, purulent drainage from wound, increased redness or drainage from wound, foul odor from the wound/dressing, or calf pain - CONTACT YOUR SURGEON.   °                                                °FOLLOW-UP APPOINTMENTS °Make sure you keep all of your appointments after your operation with your surgeon and caregivers. You should call the office at the above phone number and make an appointment for approximately two weeks after the date of your surgery or on the date instructed by your surgeon outlined in the "After Visit Summary". ° ° °RANGE OF MOTION AND STRENGTHENING EXERCISES  °Rehabilitation of the knee is important following a knee injury or an operation. After just a few days of immobilization, the muscles of the thigh which control the knee become weakened and   shrink (atrophy). Knee exercises are designed to build up the tone and strength of the thigh muscles and to improve knee motion. Often times heat used for twenty to thirty minutes before working out will loosen up your tissues and help with improving the range of motion but do not use heat for the first two weeks following surgery. These exercises can be done on a training (exercise) mat, on the floor, on a table or on a bed. Use what ever works the best and is most comfortable for you Knee exercises include:  °Leg Lifts - While your knee is still immobilized in a splint or cast, you can do straight leg raises. Lift the leg to 60 degrees, hold for 3 sec, and slowly lower the leg. Repeat 10-20 times 2-3 times daily. Perform this exercise against resistance later as your knee gets better.  °Quad and Hamstring Sets - Tighten up the muscle on the front of the thigh (Quad) and hold for 5-10 sec. Repeat this 10-20 times hourly. Hamstring sets are done by pushing the foot backward against an object and holding for 5-10 sec. Repeat as with quad sets.  °· Leg Slides: Lying on your back,  slowly slide your foot toward your buttocks, bending your knee up off the floor (only go as far as is comfortable). Then slowly slide your foot back down until your leg is flat on the floor again. °· Angel Wings: Lying on your back spread your legs to the side as far apart as you can without causing discomfort.  °A rehabilitation program following serious knee injuries can speed recovery and prevent re-injury in the future due to weakened muscles. Contact your doctor or a physical therapist for more information on knee rehabilitation.  ° °IF YOU ARE TRANSFERRED TO A SKILLED REHAB FACILITY °If the patient is transferred to a skilled rehab facility following release from the hospital, a list of the current medications will be sent to the facility for the patient to continue.  When discharged from the skilled rehab facility, please have the facility set up the patient's Home Health Physical Therapy prior to being released. Also, the skilled facility will be responsible for providing the patient with their medications at time of release from the facility to include their pain medication, the muscle relaxants, and their blood thinner medication. If the patient is still at the rehab facility at time of the two week follow up appointment, the skilled rehab facility will also need to assist the patient in arranging follow up appointment in our office and any transportation needs. ° °MAKE SURE YOU:  °Understand these instructions.  °Get help right away if you are not doing well or get worse.  ° ° °Pick up stool softner and laxative for home use following surgery while on pain medications. °Do not submerge incision under water. °Please use good hand washing techniques while changing dressing each day. °May shower starting three days after surgery. °Please use a clean towel to pat the incision dry following showers. °Continue to use ice for pain and swelling after surgery. °Do not use any lotions or creams on the incision  until instructed by your surgeon. ° °Information on my medicine - XARELTO® (Rivaroxaban) ° °Why was Xarelto® prescribed for you? °Xarelto® was prescribed for you to reduce the risk of blood clots forming after orthopedic surgery. The medical term for these abnormal blood clots is venous thromboembolism (VTE). ° °What do you need to know about xarelto® ? °Take your Xarelto® ONCE DAILY   at the same time every day. °You may take it either with or without food. ° °If you have difficulty swallowing the tablet whole, you may crush it and mix in applesauce just prior to taking your dose. ° °Take Xarelto® exactly as prescribed by your doctor and DO NOT stop taking Xarelto® without talking to the doctor who prescribed the medication.  Stopping without other VTE prevention medication to take the place of Xarelto® may increase your risk of developing a clot. ° °After discharge, you should have regular check-up appointments with your healthcare provider that is prescribing your Xarelto®.   ° °What do you do if you miss a dose? °If you miss a dose, take it as soon as you remember on the same day then continue your regularly scheduled once daily regimen the next day. Do not take two doses of Xarelto® on the same day.  ° °Important Safety Information °A possible side effect of Xarelto® is bleeding. You should call your healthcare provider right away if you experience any of the following: °? Bleeding from an injury or your nose that does not stop. °? Unusual colored urine (red or dark brown) or unusual colored stools (red or black). °? Unusual bruising for unknown reasons. °? A serious fall or if you hit your head (even if there is no bleeding). ° °Some medicines may interact with Xarelto® and might increase your risk of bleeding while on Xarelto®. To help avoid this, consult your healthcare provider or pharmacist prior to using any new prescription or non-prescription medications, including herbals, vitamins, non-steroidal  anti-inflammatory drugs (NSAIDs) and supplements. ° °This website has more information on Xarelto®: www.xarelto.com. ° ° ° °

## 2018-06-09 NOTE — Transfer of Care (Signed)
Immediate Anesthesia Transfer of Care Note  Patient: Andrew Bonilla  Procedure(s) Performed: LEFT TOTAL KNEE ARTHROPLASTY (Left Knee)  Patient Location: PACU  Anesthesia Type:Spinal  Level of Consciousness: awake, alert , oriented and patient cooperative  Airway & Oxygen Therapy: Patient Spontanous Breathing and Patient connected to face mask oxygen  Post-op Assessment: Report given to RN and Post -op Vital signs reviewed and stable  Post vital signs: Reviewed and stable  Last Vitals:  Vitals Value Taken Time  BP 116/52 06/09/2018 12:03 PM  Temp    Pulse 55 06/09/2018 12:05 PM  Resp 15 06/09/2018 12:05 PM  SpO2 100 % 06/09/2018 12:05 PM  Vitals shown include unvalidated device data.  Last Pain:  Vitals:   06/09/18 1010  TempSrc:   PainSc: 0-No pain      Patients Stated Pain Goal: 4 (25/89/48 3475)  Complications: No apparent anesthesia complications

## 2018-06-09 NOTE — Anesthesia Procedure Notes (Signed)
Spinal  Start time: 06/09/2018 10:37 AM End time: 06/09/2018 10:40 AM Staffing Anesthesiologist: Effie Berkshire, MD Performed: anesthesiologist  Preanesthetic Checklist Completed: patient identified, site marked, surgical consent, pre-op evaluation, timeout performed, IV checked, risks and benefits discussed and monitors and equipment checked Spinal Block Patient position: sitting Prep: site prepped and draped and DuraPrep Location: L3-4 Injection technique: single-shot Needle Needle type: Pencan  Needle gauge: 24 G Needle length: 10 cm Needle insertion depth: 10 cm Additional Notes Patient tolerated well. No immediate complications.

## 2018-06-09 NOTE — Op Note (Signed)
OPERATIVE REPORT-TOTAL KNEE ARTHROPLASTY   Pre-operative diagnosis- Osteoarthritis  Left knee(s)  Post-operative diagnosis- Osteoarthritis Left knee(s)  Procedure-  Left  Total Knee Arthroplasty  Surgeon- Dione Plover. Evelynn Hench, MD  Assistant- Ardeen Jourdain, PA-C   Anesthesia-  Adductor canal block and spinal  EBL-50 mL   Drains Hemovac  Tourniquet time-  Total Tourniquet Time Documented: Thigh (Left) - 38 minutes Total: Thigh (Left) - 38 minutes     Complications- None  Condition-PACU - hemodynamically stable.   Brief Clinical Note   Andrew Bonilla is a 84 y.o. year old male with end stage OA of his left knee with progressively worsening pain and dysfunction. He has constant pain, with activity and at rest and significant functional deficits with difficulties even with ADLs. He has had extensive non-op management including analgesics, injections of cortisone and viscosupplements, and home exercise program, but remains in significant pain with significant dysfunction. Radiographs show bone on bone arthritis medial and patellofemoral. He presents now for left Total Knee Arthroplasty.    Procedure in detail---   The patient is brought into the operating room and positioned supine on the operating table. After successful administration of  Adductor canal block and spinal,   a tourniquet is placed high on the  Left thigh(s) and the lower extremity is prepped and draped in the usual sterile fashion. Time out is performed by the operating team and then the  Left lower extremity is wrapped in Esmarch, knee flexed and the tourniquet inflated to 300 mmHg.       A midline incision is made with a ten blade through the subcutaneous tissue to the level of the extensor mechanism. A fresh blade is used to make a medial parapatellar arthrotomy. Soft tissue over the proximal medial tibia is subperiosteally elevated to the joint line with a knife and into the semimembranosus bursa with a Cobb  elevator. Soft tissue over the proximal lateral tibia is elevated with attention being paid to avoiding the patellar tendon on the tibial tubercle. The patella is everted, knee flexed 90 degrees and the ACL and PCL are removed. Findings are bone on bone medial and patellofemoral with large global osteophytes.        The drill is used to create a starting hole in the distal femur and the canal is thoroughly irrigated with sterile saline to remove the fatty contents. The 5 degree Left  valgus alignment guide is placed into the femoral canal and the distal femoral cutting block is pinned to remove 10 mm off the distal femur. Resection is made with an oscillating saw.      The tibia is subluxed forward and the menisci are removed. The extramedullary alignment guide is placed referencing proximally at the medial aspect of the tibial tubercle and distally along the second metatarsal axis and tibial crest. The block is pinned to remove 6mm off the more deficient medial  side. Resection is made with an oscillating saw. Size 4is the most appropriate size for the tibia and the proximal tibia is prepared with the modular drill and keel punch for that size.      The femoral sizing guide is placed and size 4 is most appropriate. Rotation is marked off the epicondylar axis and confirmed by creating a rectangular flexion gap at 90 degrees. The size 4 cutting block is pinned in this rotation and the anterior, posterior and chamfer cuts are made with the oscillating saw. The intercondylar block is then placed and that cut is made.  Trial size 4 tibial component, trial size 4 posterior stabilized femur and a 15  mm posterior stabilized rotating platform insert trial is placed. Full extension is achieved with excellent varus/valgus and anterior/posterior balance throughout full range of motion. The patella is everted and thickness measured to be 27  mm. Free hand resection is taken to 15 mm, a 41 template is placed, lug holes  are drilled, trial patella is placed, and it tracks normally. Osteophytes are removed off the posterior femur with the trial in place. All trials are removed and the cut bone surfaces prepared with pulsatile lavage. Cement is mixed and once ready for implantation, the size 4 tibial implant, size  4 posterior stabilized femoral component, and the size 41 patella are cemented in place and the patella is held with the clamp. The trial insert is placed and the knee held in full extension. The Exparel (20 ml mixed with 60 ml saline) is injected into the extensor mechanism, posterior capsule, medial and lateral gutters and subcutaneous tissues.  All extruded cement is removed and once the cement is hard the permanent 15 mm posterior stabilized rotating platform insert is placed into the tibial tray.      The wound is copiously irrigated with saline solution and the extensor mechanism closed over a hemovac drain with #1 V-loc suture. The tourniquet is released for a total tourniquet time of 38  minutes. Flexion against gravity is 140 degrees and the patella tracks normally. Subcutaneous tissue is closed with 2.0 vicryl and subcuticular with running 4.0 Monocryl. The incision is cleaned and dried and steri-strips and a bulky sterile dressing are applied. The limb is placed into a knee immobilizer and the patient is awakened and transported to recovery in stable condition.      Please note that a surgical assistant was a medical necessity for this procedure in order to perform it in a safe and expeditious manner. Surgical assistant was necessary to retract the ligaments and vital neurovascular structures to prevent injury to them and also necessary for proper positioning of the limb to allow for anatomic placement of the prosthesis.   Dione Plover Blessing Ozga, MD    06/09/2018, 11:44 AM

## 2018-06-10 ENCOUNTER — Encounter (HOSPITAL_COMMUNITY): Payer: Self-pay | Admitting: Orthopedic Surgery

## 2018-06-10 DIAGNOSIS — M1710 Unilateral primary osteoarthritis, unspecified knee: Secondary | ICD-10-CM | POA: Diagnosis present

## 2018-06-10 DIAGNOSIS — M1712 Unilateral primary osteoarthritis, left knee: Secondary | ICD-10-CM | POA: Diagnosis not present

## 2018-06-10 DIAGNOSIS — M179 Osteoarthritis of knee, unspecified: Secondary | ICD-10-CM | POA: Diagnosis present

## 2018-06-10 LAB — CBC
HEMATOCRIT: 32.3 % — AB (ref 39.0–52.0)
Hemoglobin: 11.3 g/dL — ABNORMAL LOW (ref 13.0–17.0)
MCH: 30.8 pg (ref 26.0–34.0)
MCHC: 35 g/dL (ref 30.0–36.0)
MCV: 88 fL (ref 80.0–100.0)
Platelets: 283 10*3/uL (ref 150–400)
RBC: 3.67 MIL/uL — AB (ref 4.22–5.81)
RDW: 12.7 % (ref 11.5–15.5)
WBC: 13.6 10*3/uL — ABNORMAL HIGH (ref 4.0–10.5)

## 2018-06-10 LAB — BASIC METABOLIC PANEL
Anion gap: 11 (ref 5–15)
BUN: 11 mg/dL (ref 8–23)
CALCIUM: 9 mg/dL (ref 8.9–10.3)
CO2: 26 mmol/L (ref 22–32)
CREATININE: 0.86 mg/dL (ref 0.61–1.24)
Chloride: 98 mmol/L (ref 98–111)
GLUCOSE: 128 mg/dL — AB (ref 70–99)
Potassium: 3.4 mmol/L — ABNORMAL LOW (ref 3.5–5.1)
SODIUM: 135 mmol/L (ref 135–145)

## 2018-06-10 MED ORDER — POTASSIUM CHLORIDE CRYS ER 20 MEQ PO TBCR
40.0000 meq | EXTENDED_RELEASE_TABLET | Freq: Once | ORAL | Status: AC
  Administered 2018-06-10: 40 meq via ORAL
  Filled 2018-06-10: qty 2

## 2018-06-10 NOTE — Evaluation (Signed)
Occupational Therapy Evaluation Patient Details Name: Andrew Bonilla MRN: [id number] DOB: 09-12-1932 Today's Date: 06/10/2018    History of Present Illness LTKA, H/O AVR.   Clinical Impression   This 84 year old man was admitted for the above sx.  Wife present and will assist pt with adls at home.  Practiced toilet transfer; will continue to educate on safety with this as well as educate on shower transfer sequence. Goals are for supervision to min guard level in acute setting    Follow Up Recommendations  Supervision/Assistance - 24 hour    Equipment Recommendations  3 in 1 bedside commode    Recommendations for Other Services       Precautions / Restrictions Precautions Precautions: Knee;Fall Required Braces or Orthoses: Knee Immobilizer - Left Knee Immobilizer - Left: Discontinue post op day 2 Restrictions Weight Bearing Restrictions: No      Mobility Bed Mobility         Supine to sit: Min guard     General bed mobility comments: HOB 20; used rail.  Started bringing legs off then switched to more of a roll/sidelying to sit  Transfers   Equipment used: Rolling walker (2 wheeled)   Sit to Stand: Min guard;Min assist         General transfer comment: depending on surface height. Cues for UE/LE placement    Balance                                           ADL either performed or assessed with clinical judgement   ADL Overall ADL's : Needs assistance/impaired             Lower Body Bathing: Minimal assistance;Sit to/from stand       Lower Body Dressing: Maximal assistance;Sit to/from stand   Toilet Transfer: Min guard;Minimal assistance;Ambulation;BSC;RW             General ADL Comments: wife will assist with adls as needed. Pt completes UB with set up. Educated on sidestepping safely through tight spaces. Cues for walker distance.  Pt has a 6" ledge at shower.  Pt ambulated into bathroom with min guard assist      Vision         Perception     Praxis      Pertinent Vitals/Pain Pain Assessment: 0-10 Pain Score: 1  Pain Location: left knee Pain Descriptors / Indicators: Sore Pain Intervention(s): Limited activity within patient's tolerance;Monitored during session;Premedicated before session;Repositioned     Hand Dominance     Extremity/Trunk Assessment Upper Extremity Assessment Upper Extremity Assessment: Overall WFL for tasks assessed           Communication Communication Communication: HOH(r ear hears better)   Cognition Arousal/Alertness: Awake/alert Behavior During Therapy: WFL for tasks assessed/performed Overall Cognitive Status: Within Functional Limits for tasks assessed                                     General Comments       Exercises     Shoulder Instructions      Home Living Family/patient expects to be discharged to:: Private residence Living Arrangements: Spouse/significant other Available Help at Discharge: Family               Bathroom Shower/Tub: Hospital doctor  Toilet: Standard     Home Equipment: Walker - 4 wheels   Additional Comments: 3 wheeled       Prior Functioning/Environment Level of Independence: Independent                 OT Problem List: Decreased activity tolerance;Pain;Decreased knowledge of use of DME or AE;Decreased knowledge of precautions      OT Treatment/Interventions: Self-care/ADL training;DME and/or AE instruction;Patient/family education;Therapeutic activities    OT Goals(Current goals can be found in the care plan section) Acute Rehab OT Goals Patient Stated Goal: to get up. play tennis OT Goal Formulation: With patient Time For Goal Achievement: 06/24/18 Potential to Achieve Goals: Good ADL Goals Pt Will Transfer to Toilet: with supervision;ambulating;bedside commode Pt Will Perform Tub/Shower Transfer: Shower transfer;with min guard assist;3 in  1;ambulating Additional ADL Goal #1: pt will complete bed mobility at supervision level from flat bed in preparation for adls  OT Frequency: Min 2X/week   Barriers to D/C:            Co-evaluation              AM-PAC PT "6 Clicks" Daily Activity     Outcome Measure Help from another person eating meals?: None Help from another person taking care of personal grooming?: A Little Help from another person toileting, which includes using toliet, bedpan, or urinal?: A Little Help from another person bathing (including washing, rinsing, drying)?: A Little Help from another person to put on and taking off regular upper body clothing?: A Little Help from another person to put on and taking off regular lower body clothing?: A Lot 6 Click Score: 18   End of Session    Activity Tolerance: Patient tolerated treatment well Patient left: in chair;with call bell/phone within reach;with family/visitor present  OT Visit Diagnosis: Pain Pain - Right/Left: Left Pain - part of body: Knee                Time: 9826-4158 OT Time Calculation (min): 40 min Charges:  OT General Charges $OT Visit: 1 Visit OT Evaluation $OT Eval Low Complexity: 1 Low OT Treatments $Self Care/Home Management : 8-22 mins  Lesle Chris, OTR/L Acute Rehabilitation Services [phone] [facility] pager [phone] office 06/10/2018  Consuello Lassalle 06/10/2018, 10:14 AM

## 2018-06-10 NOTE — Progress Notes (Signed)
   Subjective: 1 Day Post-Op Procedure(s) (LRB): LEFT TOTAL KNEE ARTHROPLASTY (Left) Patient reports pain as moderate.   Patient seen in rounds by Dr. Wynelle Link. Patient is well, and has had no acute complaints or problems other than pain in the let knee. No issues overnight. Denies chest pain, SOB, or calf pain. Foley catheter removed this AM.  We will continue therapy today.   Objective: Vital signs in last 24 hours: Temp:  [97.6 F (36.4 C)-98.5 F (36.9 C)] 98.5 F (36.9 C) (10/08 0519) Pulse Rate:  [49-99] 97 (10/08 0519) Resp:  [11-32] 16 (10/07 2134) BP: (116-173)/(41-86) 148/69 (10/08 0519) SpO2:  [94 %-100 %] 97 % (10/08 0519) Weight:  [102 kg-102.1 kg] 102 kg (10/07 1400)  Intake/Output from previous day:  Intake/Output Summary (Last 24 hours) at 06/10/2018 0750 Last data filed at 06/10/2018 0600 Gross per 24 hour  Intake 3914.16 ml  Output 3230 ml  Net 684.16 ml    Labs: Recent Labs    06/10/18 0517  HGB 11.3*   Recent Labs    06/10/18 0517  WBC 13.6*  RBC 3.67*  HCT 32.3*  PLT 283   Recent Labs    06/10/18 0517  NA 135  K 3.4*  CL 98  CO2 26  BUN 11  CREATININE 0.86  GLUCOSE 128*  CALCIUM 9.0   Exam: General - Patient is Alert and Oriented Extremity - Neurologically intact Neurovascular intact Sensation intact distally Dorsiflexion/Plantar flexion intact Dressing - dressing C/D/I Motor Function - intact, moving foot and toes well on exam.   Past Medical History:  Diagnosis Date  . Arthritis   . Constipation   . Deaf, left    WEARS HEARING AID RIGHT EAR   . GERD (gastroesophageal reflux disease)   . Hard of hearing   . Heart murmur    mod-severe aortic stenosis   . Hyperlipemia   . Hypertension   . Pneumonia    in college  . Prostate enlargement   . Recurrent upper respiratory infection (URI)    12/2011  . S/P AVR (aortic valve replacement) 01/16/2012   [facility] Ease 38mm pericardial tissue valve    Assessment/Plan: 1  Day Post-Op Procedure(s) (LRB): LEFT TOTAL KNEE ARTHROPLASTY (Left) Principal Problem:   OA (osteoarthritis) of knee  Estimated body mass index is 35.22 kg/m as calculated from the following:   Height as of this encounter: 5\' 7"  (1.702 m).   Weight as of this encounter: 102 kg. Advance diet Up with therapy  Anticipated LOS equal to or greater than 2 midnights due to - Age 32 and older with one or more of the following:  - Obesity  - Expected need for hospital services (PT, OT, Nursing) required for safe  discharge  - Anticipated need for postoperative skilled nursing care or inpatient rehab  - Active co-morbidities: s/p aortic valve replacement and mod-severe aortic stenosis OR   - Unanticipated findings during/Post Surgery: None  - Patient is a high risk of re-admission due to: None    DVT Prophylaxis - Xarelto Weight bearing as tolerated. D/C O2 and pulse ox and try on room air. Hemovac pulled without difficulty, will continue therapy today.  Potassium at 3.4 this AM, will give one dose of 40 mEq KCl.  Plan is to go Home after hospital stay. Possible discharge tomorrow if meeting goals.   Theresa Duty, PA-C Orthopedic Surgery 06/10/2018, 7:50 AM

## 2018-06-10 NOTE — Care Management Note (Signed)
Case Management Note  Patient Details  Name: Andrew Bonilla MRN: [id number] Date of Birth: 1932-10-01  Subjective/Objective:    Spoke with patient at bedside. Confirmed plan for OP PT, already arranged.   Action/Plan: Needs a RW and 3n1, contacted [facility] to deliver to the room. [phone]                Expected Discharge Date:                  Expected Discharge Plan:  OP Rehab  In-House Referral:  NA  Discharge planning Services  CM Consult  Post Acute Care Choice:  Durable Medical Equipment Choice offered to:  Patient, Spouse  DME Arranged:  3-N-1, Walker rolling DME Agency:  [place]:  NA [place] Agency:  NA  Status of Service:  Completed, signed off  If discussed at [place] of Stay Meetings, dates discussed:    Additional Comments:  Guadalupe Maple, RN 06/10/2018, 10:18 AM

## 2018-06-10 NOTE — Progress Notes (Signed)
Physical Therapy Treatment Patient Details Name: Andrew Bonilla MRN: [id number] DOB: 06-27-1933 Today's Date: 06/10/2018    History of Present Illness LTKA, H/O AVR.    PT Comments    The patient is progressing very well. Plans Dc tomorrow after PT/ steps.   Follow Up Recommendations  Outpatient PT;Follow surgeon's recommendation for DC plan and follow-up therapies     Equipment Recommendations  Rolling walker with 5" wheels    Recommendations for Other Services       Precautions / Restrictions Precautions Precautions: Knee;Fall Precaution Comments: removed KI during ambualtion Required Braces or Orthoses: Knee Immobilizer - Left Knee Immobilizer - Left: Discontinue once straight leg raise with < 10 degree lag    Mobility  Bed Mobility   Bed Mobility: Sit to Supine     Supine to sit: Mod assist Sit to supine: Min assist   General bed mobility comments: patient used leg lifter to self assist back onto the bed  Transfers Overall transfer level: Needs assistance Equipment used: Rolling walker (2 wheeled) Transfers: Sit to/from Stand Sit to Stand: Min assist         General transfer comment: assist to rise from recliner.  Ambulation/Gait Ambulation/Gait assistance: Min assist Gait Distance (Feet): 200 Feet Assistive device: Rolling walker (2 wheeled) Gait Pattern/deviations: Step-to pattern;Step-through pattern     General Gait Details: removed KI during distance with patient having steady gait.   Stairs             Wheelchair Mobility    Modified Rankin (Stroke Patients Only)       Balance                                            Cognition Arousal/Alertness: Awake/alert                                            Exercises   General Comments        Pertinent Vitals/Pain Pain Score: 3  Faces Pain Scale: Hurts a little bit Pain Location: left knee Pain Descriptors / Indicators:  Discomfort;Sore Pain Intervention(s): Premedicated before session;Monitored during session;Ice applied    Home Living                      Prior Function            PT Goals (current goals can now be found in the care plan section) Progress towards PT goals: Progressing toward goals    Frequency    7X/week      PT Plan Current plan remains appropriate    Co-evaluation              AM-PAC PT "6 Clicks" Daily Activity  Outcome Measure  Difficulty turning over in bed (including adjusting bedclothes, sheets and blankets)?: A Lot Difficulty moving from lying on back to sitting on the side of the bed? : A Lot Difficulty sitting down on and standing up from a chair with arms (e.g., wheelchair, bedside commode, etc,.)?: A Lot Help needed moving to and from a bed to chair (including a wheelchair)?: A Little Help needed walking in hospital room?: A Little Help needed climbing 3-5 steps with a railing? : Total 6 Click Score: 13  End of Session Equipment Utilized During Treatment: Gait belt Activity Tolerance: Patient tolerated treatment well Patient left: with call bell/phone within reach;with family/visitor present;in bed Nurse Communication: Mobility status PT Visit Diagnosis: Unsteadiness on feet (R26.81);Pain Pain - Right/Left: Left Pain - part of body: Knee     Time: 9373-4287 PT Time Calculation (min) (ACUTE ONLY): 24 min  Charges:  $Gait Training: 23-37 mins $Therapeutic Exercise: 8-22 mins $Self Care/Home Management: [place] Pager [phone] Office [phone]    Claretha Cooper 06/10/2018, 4:30 PM

## 2018-06-10 NOTE — Care Management Obs Status (Signed)
[place] NOTIFICATION   Patient Details  Name: JAHNI NAZAR MRN: [id number] Date of Birth: [birth year]   Medicare Observation Status Notification Given:  Yes    Guadalupe Maple, RN 06/10/2018, 10:48 AM

## 2018-06-10 NOTE — Care Management CC44 (Signed)
Condition Code 44 Documentation Completed  Patient Details  Name: Andrew Bonilla MRN: [id number] Date of Birth: 06-15-1933   Condition Code 44 given:  Yes Patient signature on Condition Code 44 notice:  Yes Documentation of 2 MD's agreement:  Yes Code 44 added to claim:  Yes    Guadalupe Maple, RN 06/10/2018, 10:48 AM

## 2018-06-10 NOTE — Progress Notes (Signed)
Physical Therapy Treatment Patient Details Name: Andrew Bonilla  MRN: [id number] DOB: 09-24-1932 Today's Date: 06/10/2018    History of Present Illness LTKA, H/O AVR.    PT Comments    The  Patient requires moderate assistance  For bed mobility, especially onto and off of high bed.. Continue PT.  Follow Up Recommendations  Outpatient PT;Follow surgeon's recommendation for DC plan and follow-up therapies     Equipment Recommendations  Rolling walker with 5" wheels    Recommendations for Other Services       Precautions / Restrictions Precautions Precautions: Knee;Fall Required Braces or Orthoses: Knee Immobilizer - Left Knee Immobilizer - Left: Discontinue once straight leg raise with < 10 degree lag Restrictions Weight Bearing Restrictions: No    Mobility  Bed Mobility   Bed Mobility: Supine to Sit;Sit to Supine     Supine to sit: Mod assist Sit to supine: Mod assist   General bed mobility comments: patient assisted onto bed  needing assist for left leg. Used leg lifter to self assist to bed edge to the right.  extra time and effort, gradually able to get to position.   Transfers Overall transfer level: Needs assistance Equipment used: Rolling walker (2 wheeled) Transfers: Sit to/from Stand Sit to Stand: Mod assist         General transfer comment: assist to step up backwards onto a step to get onto high bed.  Ambulation/Gait Ambulation/Gait assistance: Min assist Gait Distance (Feet): 90 Feet Assistive device: Rolling walker (2 wheeled) Gait Pattern/deviations: Step-to pattern;Step-through pattern     General Gait Details: cues for sequence and posture   Stairs             Wheelchair Mobility    Modified Rankin (Stroke Patients Only)       Balance                                            Cognition Arousal/Alertness: Awake/alert Behavior During Therapy: WFL for tasks assessed/performed Overall Cognitive Status:  Within Functional Limits for tasks assessed                                        Exercises Total Joint Exercises Ankle Circles/Pumps: AROM;Both;10 reps;Supine Quad Sets: AROM;Both;10 reps;Supine Heel Slides: AAROM;Supine;Left;10 reps Hip ABduction/ADduction: AAROM;Supine;Left;10 reps Straight Leg Raises: AAROM;Supine;Left;10 reps Goniometric ROM: 5-60 knee flexion on left    General Comments        Pertinent Vitals/Pain Pain Assessment: 0-10 Pain Score: 3  Pain Location: left knee Pain Descriptors / Indicators: Aching Pain Intervention(s): Monitored during session;Premedicated before session    Home Living Family/patient expects to be discharged to:: Private residence Living Arrangements: Spouse/significant other Available Help at Discharge: Family         Home Equipment: Gilford Rile - 4 wheels Additional Comments: 3 wheeled     Prior Function Level of Independence: Independent          PT Goals (current goals can now be found in the care plan section) Acute Rehab PT Goals Patient Stated Goal: to get up. play tennis Progress towards PT goals: Progressing toward goals    Frequency    7X/week      PT Plan Current plan remains appropriate    Co-evaluation  AM-PAC PT "6 Clicks" Daily Activity  Outcome Measure  Difficulty turning over in bed (including adjusting bedclothes, sheets and blankets)?: A Lot Difficulty moving from lying on back to sitting on the side of the bed? : A Lot Difficulty sitting down on and standing up from a chair with arms (e.g., wheelchair, bedside commode, etc,.)?: A Little Help needed moving to and from a bed to chair (including a wheelchair)?: A Lot Help needed walking in hospital room?: A Lot Help needed climbing 3-5 steps with a railing? : Total 6 Click Score: 12    End of Session Equipment Utilized During Treatment: Gait belt;Left knee immobilizer Activity Tolerance: Patient tolerated  treatment well Patient left: in chair;with call bell/phone within reach;with family/visitor present Nurse Communication: Mobility status PT Visit Diagnosis: Unsteadiness on feet (R26.81);Pain Pain - Right/Left: Left Pain - part of body: Knee     Time: 2257-5051 PT Time Calculation (min) (ACUTE ONLY): 55 min  Charges:  $Gait Training: 8-22 mins $Therapeutic Exercise: 8-22 mins $Self Care/Home Management: [place] Pager [phone] Office [phone]    Claretha Cooper 06/10/2018, 1:23 PM

## 2018-06-11 DIAGNOSIS — M1712 Unilateral primary osteoarthritis, left knee: Secondary | ICD-10-CM | POA: Diagnosis not present

## 2018-06-11 LAB — CBC
HCT: 31.7 % — ABNORMAL LOW (ref 39.0–52.0)
HEMOGLOBIN: 10.8 g/dL — AB (ref 13.0–17.0)
MCH: 30.8 pg (ref 26.0–34.0)
MCHC: 34.1 g/dL (ref 30.0–36.0)
MCV: 90.3 fL (ref 80.0–100.0)
Platelets: 278 10*3/uL (ref 150–400)
RBC: 3.51 MIL/uL — AB (ref 4.22–5.81)
RDW: 12.8 % (ref 11.5–15.5)
WBC: 17.3 10*3/uL — ABNORMAL HIGH (ref 4.0–10.5)
nRBC: 0 % (ref 0.0–0.2)

## 2018-06-11 LAB — BASIC METABOLIC PANEL
Anion gap: 14 (ref 5–15)
BUN: 14 mg/dL (ref 8–23)
CHLORIDE: 96 mmol/L — AB (ref 98–111)
CO2: 27 mmol/L (ref 22–32)
CREATININE: 0.84 mg/dL (ref 0.61–1.24)
Calcium: 9.5 mg/dL (ref 8.9–10.3)
GFR calc Af Amer: >60 mL/min (ref >60)
GFR calc non Af Amer: >60 mL/min (ref >60)
GLUCOSE: 120 mg/dL — AB (ref 70–99)
POTASSIUM: 4 mmol/L (ref 3.5–5.1)
Sodium: 137 mmol/L (ref 135–145)

## 2018-06-11 MED ORDER — OXYCODONE HCL 5 MG PO TABS: 5.0000 mg | ORAL_TABLET | Freq: Four times a day (QID) | ORAL | 0 refills | Status: DC | PRN

## 2018-06-11 MED ORDER — METHOCARBAMOL 500 MG PO TABS: 500.0000 mg | ORAL_TABLET | Freq: Four times a day (QID) | ORAL | 0 refills | Status: DC | PRN

## 2018-06-11 MED ORDER — RIVAROXABAN 10 MG PO TABS: 10.0000 mg | ORAL_TABLET | Freq: Every day | ORAL | 0 refills | Status: DC

## 2018-06-11 NOTE — Progress Notes (Signed)
Physical Therapy Treatment Patient Details Name: Andrew Bonilla MRN: [id number] DOB: 12-21-1932 Today's Date: 06/11/2018    History of Present Illness LTKA, H/O AVR.    PT Comments    The  Patient is more sluggish today. Had IV medication overnoght. Patient and wife were able  To perform 1 step upo using RW. Will review HEP  And safety next visit when daughter present.. Then will DC home  Follow Up Recommendations  Outpatient PT;Follow surgeon's recommendation for DC plan and follow-up therapies     Equipment Recommendations  Rolling walker with 5" wheels    Recommendations for Other Services       Precautions / Restrictions Precautions Precautions: Knee;Fall Precaution Comments: used KI; pt had difficulty with SLR this am,  Required Braces or Orthoses: Knee Immobilizer - Left Knee Immobilizer - Left: Discontinue once straight leg raise with < 10 degree lag Restrictions Weight Bearing Restrictions: No    Mobility  Bed Mobility         Supine to sit: Supervision     General bed mobility comments: in recliner  Transfers Overall transfer level: Needs assistance Equipment used: Rolling walker (2 wheeled) Transfers: Sit to/from Stand Sit to Stand: Min assist         General transfer comment: patient uses leg lifter to lower the leg to floor for safety; cues for LE placement,  Wife present and assisted patient to stand up from recliner, gait belt utilized  Ambulation/Gait Ambulation/Gait assistance: Min Web designer (Feet): 100 Feet Assistive device: Rolling walker (2 wheeled) Gait Pattern/deviations: Step-to pattern;Step-through pattern     General Gait Details: ambulated with KI today.    Stairs Stairs: Yes Stairs assistance: Min assist Stair Management: No rails;Forwards;With walker Number of Stairs: 1 General stair comments: spouse present and  assisted with 1 step forward.    Wheelchair Mobility    Modified Rankin (Stroke Patients  Only)       Balance                                            Cognition Arousal/Alertness: Awake/alert Behavior During Therapy: WFL for tasks assessed/performed Overall Cognitive Status: Within Functional Limits for tasks assessed                                 General Comments: sleepier and more sluggish; Had IV meds at 2 AM      Exercises      General Comments        Pertinent Vitals/Pain Pain Score: 4  Faces Pain Scale: Hurts a little bit Pain Location: left knee Pain Descriptors / Indicators: Discomfort;Sore Pain Intervention(s): Monitored during session;Premedicated before session    Home Living                      Prior Function            PT Goals (current goals can now be found in the care plan section) Progress towards PT goals: Progressing toward goals    Frequency    7X/week      PT Plan Current plan remains appropriate    Co-evaluation              AM-PAC PT "6 Clicks" Daily Activity  Outcome Measure  Difficulty turning over in bed (  including adjusting bedclothes, sheets and blankets)?: A Lot Difficulty moving from lying on back to sitting on the side of the bed? : A Lot Difficulty sitting down on and standing up from a chair with arms (e.g., wheelchair, bedside commode, etc,.)?: A Lot Help needed moving to and from a bed to chair (including a wheelchair)?: A Little Help needed walking in hospital room?: A Little Help needed climbing 3-5 steps with a railing? : A Lot 6 Click Score: 14    End of Session Equipment Utilized During Treatment: Gait belt Activity Tolerance: Patient tolerated treatment well Patient left: in chair;with call bell/phone within reach;with family/visitor present Nurse Communication: Mobility status PT Visit Diagnosis: Unsteadiness on feet (R26.81) Pain - Right/Left: Left Pain - part of body: Knee     Time: 0932-6712 PT Time Calculation (min) (ACUTE ONLY): 27  min  Charges:  $Gait Training: 23-37 mins                     [place] Pager [phone] Office [phone]    Claretha Cooper 06/11/2018, 2:01 PM

## 2018-06-11 NOTE — Progress Notes (Signed)
Occupational Therapy Treatment Patient Details Name: Andrew Bonilla MRN: [id number] DOB: Dec 22, 1932 Today's Date: 06/11/2018    History of present illness LTKA, H/O AVR.   OT comments  All education completed this session.  No further OT is needed  Follow Up Recommendations  Supervision/Assistance - 24 hour    Equipment Recommendations  3 in 1 bedside commode    Recommendations for Other Services      Precautions / Restrictions Precautions Precautions: Knee;Fall Precaution Comments: used KI; pt had difficulty with SLR this am Required Braces or Orthoses: Knee Immobilizer - Left Knee Immobilizer - Left: Discontinue once straight leg raise with < 10 degree lag Restrictions Weight Bearing Restrictions: No       Mobility Bed Mobility         Supine to sit: Supervision     General bed mobility comments: extra time, cues for squence and use of leg lifter  Transfers   Equipment used: Rolling walker (2 wheeled)   Sit to Stand: Min guard         General transfer comment: for safety; cues for LE placement    Balance                                           ADL either performed or assessed with clinical judgement   ADL                           Toilet Transfer: Supervision/safety;RW(simulated with chair)       Tub/ Shower Transfer: Walk-in shower;Min guard;Ambulation     General ADL Comments: simulated shower ledge (as one is room is not like his).  Wife present and verbalizes understanding.  Used KI, provided handout and also discussed placement options for 3:1 commode.  Pt practiced sidestepping through tight space.  He used leg lifter for OOB and to help with application of KI     Vision       Perception     Praxis      Cognition Arousal/Alertness: Awake/alert Behavior During Therapy: WFL for tasks assessed/performed Overall Cognitive Status: Within Functional Limits for tasks assessed                                          Exercises     Shoulder Instructions       General Comments      Pertinent Vitals/ Pain       Faces Pain Scale: Hurts a little bit Pain Location: left knee Pain Descriptors / Indicators: Discomfort;Sore Pain Intervention(s): Limited activity within patient's tolerance;Monitored during session;Premedicated before session;Repositioned  Home Living                                          Prior Functioning/Environment              Frequency  Min 2X/week        Progress Toward Goals  OT Goals(current goals can now be found in the care plan section)  Progress towards OT goals: Goals met/education completed, patient discharged from [place] [facility]  AM-PAC PT "6 Clicks" Daily Activity     Outcome Measure   Help from another person eating meals?: None Help from another person taking care of personal grooming?: A Little Help from another person toileting, which includes using toliet, bedpan, or urinal?: A Little Help from another person bathing (including washing, rinsing, drying)?: A Little Help from another person to put on and taking off regular upper body clothing?: A Little Help from another person to put on and taking off regular lower body clothing?: A Lot 6 Click Score: 18    End of Session    OT Visit Diagnosis: Pain Pain - Right/Left: Left Pain - part of body: Knee   Activity Tolerance Patient tolerated treatment well   Patient Left in chair;with call bell/phone within reach;with family/visitor present   Nurse Communication          Time: 8592-7639 OT Time Calculation (min): 20 min  Charges: OT General Charges $OT Visit: 1 Visit OT Treatments $Self Care/Home Management : 8-22 mins  Lesle Chris, OTR/L Acute Rehabilitation Services [phone] [facility] pager [phone] office 06/11/2018   Andrew Bonilla 06/11/2018, 11:55 AM

## 2018-06-11 NOTE — Progress Notes (Addendum)
Physical Therapy Treatment Patient Details Name: Andrew Bonilla MRN: [id number] DOB: Jan 14, 1933 Today's Date: 06/11/2018    History of Present Illness LTKA, H/O AVR.    PT Comments    Reviewed exercises, use of KI for inand out of home, OK to ambulate in house without KI. Patient ready for DC.   Follow Up Recommendations  Outpatient PT;Follow surgeon's recommendation for DC plan and follow-up therapies     Equipment Recommendations  Rolling walker with 5" wheels    Recommendations for Other Services       Precautions / Restrictions Precautions Precautions: Knee;Fall Precaution Comments: used KI; pt had difficulty with SLR this am,  Required Braces or Orthoses: Knee Immobilizer - Left Knee Immobilizer - Left: Discontinue once straight leg raise with < 10 degree lag Restrictions Weight Bearing Restrictions: No    Mobility  Bed Mobility          Wheelchair Mobility    Modified Rankin (Stroke Patients Only)       Balance                                            Cognition Arousal/Alertness: Awake/alert Behavior During Therapy: WFL for tasks assessed/performed Overall Cognitive Status: Within Functional Limits for tasks assessed                                 General Comments: sleepier and more sluggish; Had IV meds at 2 AM      Exercises Total Joint Exercises Quad Sets: AROM;Both;10 reps Heel Slides: AAROM;Left;10 reps;Supine Hip ABduction/ADduction: AAROM;Supine;Left;10 reps Straight Leg Raises: AAROM;Supine;Left;10 reps Long Arc Quad: AAROM;Seated;Left;10 reps Knee Flexion: AAROM;Seated;Left;10 reps Goniometric ROM: 5-60 left knee flexion    General Comments        Pertinent Vitals/Pain Pain Score: 4  Faces Pain Scale: Hurts little more Pain Location: left knee Pain Descriptors / Indicators: Discomfort;Sore;Tightness Pain Intervention(s): Monitored during session;Ice applied    Home Living                       Prior Function            PT Goals (current goals can now be found in the care plan section) Progress towards PT goals: Progressing toward goals    Frequency    7X/week      PT Plan Current plan remains appropriate    Co-evaluation              AM-PAC PT "6 Clicks" Daily Activity  Outcome Measure  Difficulty turning over in bed (including adjusting bedclothes, sheets and blankets)?: A Lot Difficulty moving from lying on back to sitting on the side of the bed? : A Lot Difficulty sitting down on and standing up from a chair with arms (e.g., wheelchair, bedside commode, etc,.)?: A Lot Help needed moving to and from a bed to chair (including a wheelchair)?: A Little Help needed walking in hospital room?: A Little Help needed climbing 3-5 steps with a railing? : A Lot 6 Click Score: 14    End of Session Equipment Utilized During Treatment: Gait belt Activity Tolerance: Patient tolerated treatment well Patient left: in chair;with call bell/phone within reach;with family/visitor present Nurse Communication: Mobility status PT Visit Diagnosis: Unsteadiness on feet (R26.81) Pain - Right/Left: Left Pain - part of  body: Knee     Time: 6222-9798 PT Time Calculation (min) (ACUTE ONLY): 27 min  Charges:  $Self Care/Home Management: 8-22          [place] Pager [phone] Office [phone]    Claretha Cooper 06/11/2018, 2:06 PM

## 2018-06-11 NOTE — Progress Notes (Addendum)
   Subjective: 2 Days Post-Op Procedure(s) (LRB): LEFT TOTAL KNEE ARTHROPLASTY (Left) Patient reports pain as mild.   Patient seen in rounds with Dr. Wynelle Link. Patient is well, and has had no acute complaints or problems. States he is ready to go home. No issues overnight. Denies chest pain, SOB, or calf pain. Voiding without difficulty and positive flatus.  Plan is to go Home after hospital stay.  Objective: Vital signs in last 24 hours: Temp:  [97.7 F (36.5 C)-98.7 F (37.1 C)] 98.4 F (36.9 C) (10/09 0541) Pulse Rate:  [67-83] 70 (10/09 0541) Resp:  [16] 16 (10/08 2200) BP: (117-154)/(59-71) 154/68 (10/09 0541) SpO2:  [96 %-99 %] 99 % (10/09 0541)  Intake/Output from previous day:  Intake/Output Summary (Last 24 hours) at 06/11/2018 0858 Last data filed at 06/11/2018 0600 Gross per 24 hour  Intake 1683.33 ml  Output 2050 ml  Net -366.67 ml    Labs: Recent Labs    06/10/18 0517 06/11/18 0451  HGB 11.3* 10.8*   Recent Labs    06/10/18 0517 06/11/18 0451  WBC 13.6* 17.3*  RBC 3.67* 3.51*  HCT 32.3* 31.7*  PLT 283 278   Recent Labs    06/10/18 0517 06/11/18 0451  NA 135 137  K 3.4* 4.0  CL 98 96*  CO2 26 27  BUN 11 14  CREATININE 0.86 0.84  GLUCOSE 128* 120*  CALCIUM 9.0 9.5   Exam: General - Patient is Alert and Oriented Extremity - Neurologically intact Neurovascular intact Sensation intact distally Dorsiflexion/Plantar flexion intact Dressing/Incision - clean, dry, no drainage Motor Function - intact, moving foot and toes well on exam.   Past Medical History:  Diagnosis Date  . Arthritis   . Constipation   . Deaf, left    WEARS HEARING AID RIGHT EAR   . GERD (gastroesophageal reflux disease)   . Hard of hearing   . Heart murmur    mod-severe aortic stenosis   . Hyperlipemia   . Hypertension   . Pneumonia    in college  . Prostate enlargement   . Recurrent upper respiratory infection (URI)    12/2011  . S/P AVR (aortic valve  replacement) 01/16/2012   [facility] Ease 66mm pericardial tissue valve    Assessment/Plan: 2 Days Post-Op Procedure(s) (LRB): LEFT TOTAL KNEE ARTHROPLASTY (Left) Principal Problem:   OA (osteoarthritis) of knee Active Problems:   Osteoarthritis of knee  Estimated body mass index is 35.22 kg/m as calculated from the following:   Height as of this encounter: 5\' 7"  (1.702 m).   Weight as of this encounter: 102 kg. Up with therapy D/C IV fluids  DVT Prophylaxis - Xarelto Weight-bearing as tolerated  Potassium improved this AM at 4.0. Plan for discharge to home after one session of therapy this AM. F/u in the office in 2 weeks with Dr. Wynelle Link. Scheduled for outpatient PT with [facility] beginning Friday.  Theresa Duty, PA-C Orthopedic Surgery 06/11/2018, 8:58 AM

## 2018-06-11 NOTE — Progress Notes (Signed)
Pt and pt's family were provided with D/C instructions and prescriptions. After discussing the pt's plan of care upon discharge, the pt and pt's family reported no further questions or concerns.

## 2018-06-12 ENCOUNTER — Encounter (HOSPITAL_COMMUNITY): Payer: Self-pay

## 2018-06-12 ENCOUNTER — Emergency Department (HOSPITAL_COMMUNITY): Payer: Medicare Other

## 2018-06-12 ENCOUNTER — Emergency Department (HOSPITAL_COMMUNITY)
Admission: EM | Admit: 2018-06-12 | Discharge: 2018-06-12 | Disposition: A | Discharge status: Home / Self Care | Payer: Medicare Other | Attending: Emergency Medicine | Admitting: Emergency Medicine

## 2018-06-12 ENCOUNTER — Other Ambulatory Visit: Payer: Self-pay

## 2018-06-12 DIAGNOSIS — I10 Essential (primary) hypertension: Secondary | ICD-10-CM | POA: Diagnosis not present

## 2018-06-12 DIAGNOSIS — Z952 Presence of prosthetic heart valve: Secondary | ICD-10-CM | POA: Diagnosis not present

## 2018-06-12 DIAGNOSIS — Z7901 Long term (current) use of anticoagulants: Secondary | ICD-10-CM | POA: Diagnosis not present

## 2018-06-12 DIAGNOSIS — Z79899 Other long term (current) drug therapy: Secondary | ICD-10-CM | POA: Diagnosis not present

## 2018-06-12 DIAGNOSIS — R55 Syncope and collapse: Secondary | ICD-10-CM

## 2018-06-12 DIAGNOSIS — Z9889 Other specified postprocedural states: Secondary | ICD-10-CM

## 2018-06-12 DIAGNOSIS — R0600 Dyspnea, unspecified: Secondary | ICD-10-CM | POA: Diagnosis not present

## 2018-06-12 DIAGNOSIS — Z96652 Presence of left artificial knee joint: Secondary | ICD-10-CM | POA: Insufficient documentation

## 2018-06-12 DIAGNOSIS — K5903 Drug induced constipation: Secondary | ICD-10-CM

## 2018-06-12 LAB — CBC WITH DIFFERENTIAL/PLATELET
Abs Immature Granulocytes: UNDETERMINED 10*3/uL (ref 0.00–0.07)
Abs Immature Granulocytes: 0.05 10*3/uL (ref 0.00–0.07)
BASOS ABS: UNDETERMINED 10*3/uL (ref 0.0–0.1)
BASOS PCT: UNDETERMINED %
BASOS PCT: 1 %
BLASTS: UNDETERMINED %
Band Neutrophils: UNDETERMINED %
Basophils Absolute: 0.1 10*3/uL (ref 0.0–0.1)
EOS ABS: UNDETERMINED 10*3/uL (ref 0.0–0.5)
Eosinophils Absolute: 0.2 10*3/uL (ref 0.0–0.5)
Eosinophils Relative: UNDETERMINED %
Eosinophils Relative: 2 %
HCT: UNDETERMINED % (ref 39.0–52.0)
HEMATOCRIT: 31.8 % — AB (ref 39.0–52.0)
HEMOGLOBIN: UNDETERMINED g/dL (ref 13.0–17.0)
Hemoglobin: 10.8 g/dL — ABNORMAL LOW (ref 13.0–17.0)
IMMATURE GRANULOCYTES: UNDETERMINED %
Immature Granulocytes: 0 %
LYMPHS ABS: UNDETERMINED 10*3/uL (ref 0.7–4.0)
LYMPHS ABS: 2.6 10*3/uL (ref 0.7–4.0)
Lymphocytes Relative: UNDETERMINED %
Lymphocytes Relative: 23 %
MCH: UNDETERMINED pg (ref 26.0–34.0)
MCH: 30.9 pg (ref 26.0–34.0)
MCHC: UNDETERMINED g/dL (ref 30.0–36.0)
MCHC: 34 g/dL (ref 30.0–36.0)
MCV: UNDETERMINED fL (ref 80.0–100.0)
MCV: 91.1 fL (ref 80.0–100.0)
METAMYELOCYTES PCT: UNDETERMINED %
MONO ABS: 1.2 10*3/uL — AB (ref 0.1–1.0)
MONOS PCT: UNDETERMINED %
MONOS PCT: 11 %
Monocytes Absolute: UNDETERMINED 10*3/uL (ref 0.1–1.0)
Myelocytes: UNDETERMINED %
NEUTROS ABS: UNDETERMINED 10*3/uL (ref 1.7–7.7)
NEUTROS ABS: 7.2 10*3/uL (ref 1.7–7.7)
Neutrophils Relative %: UNDETERMINED %
Neutrophils Relative %: 63 %
Other: UNDETERMINED %
PLATELETS: UNDETERMINED 10*3/uL (ref 150–400)
PROMYELOCYTES RELATIVE: UNDETERMINED %
Platelets: 272 10*3/uL (ref 150–400)
RBC Morphology: UNDETERMINED
RBC: UNDETERMINED MIL/uL (ref 4.22–5.81)
RBC: 3.49 MIL/uL — ABNORMAL LOW (ref 4.22–5.81)
RDW: UNDETERMINED % (ref 11.5–15.5)
RDW: 12.9 % (ref 11.5–15.5)
Smear Review: UNDETERMINED
WBC Morphology: UNDETERMINED
WBC: UNDETERMINED 10*3/uL (ref 4.0–10.5)
WBC: 11.3 10*3/uL — ABNORMAL HIGH (ref 4.0–10.5)
nRBC: UNDETERMINED % (ref 0.0–0.2)
nRBC: UNDETERMINED /100 WBC
nRBC: 0 % (ref 0.0–0.2)

## 2018-06-12 LAB — PROTIME-INR
INR: UNDETERMINED
INR: 1.34
PROTHROMBIN TIME: UNDETERMINED s (ref 11.4–15.2)
Prothrombin Time: 16.5 seconds — ABNORMAL HIGH (ref 11.4–15.2)

## 2018-06-12 LAB — COMPREHENSIVE METABOLIC PANEL
ALBUMIN: 3.5 g/dL (ref 3.5–5.0)
ALK PHOS: 47 U/L (ref 38–126)
ALT: 27 U/L (ref 0–44)
AST: 34 U/L (ref 15–41)
Anion gap: 12 (ref 5–15)
BUN: 13 mg/dL (ref 8–23)
CALCIUM: 9 mg/dL (ref 8.9–10.3)
CO2: 27 mmol/L (ref 22–32)
CREATININE: 0.91 mg/dL (ref 0.61–1.24)
Chloride: 94 mmol/L — ABNORMAL LOW (ref 98–111)
GFR calc Af Amer: >60 mL/min (ref >60)
GFR calc non Af Amer: >60 mL/min (ref >60)
GLUCOSE: 115 mg/dL — AB (ref 70–99)
Potassium: 4 mmol/L (ref 3.5–5.1)
Sodium: 133 mmol/L — ABNORMAL LOW (ref 135–145)
Total Bilirubin: 1.1 mg/dL (ref 0.3–1.2)
Total Protein: 6.5 g/dL (ref 6.5–8.1)

## 2018-06-12 LAB — URINALYSIS, ROUTINE W REFLEX MICROSCOPIC
BILIRUBIN URINE: NEGATIVE
GLUCOSE, UA: NEGATIVE mg/dL
HGB URINE DIPSTICK: NEGATIVE
Ketones, ur: NEGATIVE mg/dL
Leukocytes, UA: NEGATIVE
Nitrite: NEGATIVE
PROTEIN: NEGATIVE mg/dL
SPECIFIC GRAVITY, URINE: 1.01 (ref 1.005–1.030)
pH: 9 — ABNORMAL HIGH (ref 5.0–8.0)

## 2018-06-12 LAB — I-STAT CG4 LACTIC ACID, ED
LACTIC ACID, VENOUS: 1.37 mmol/L (ref 0.5–1.9)
LACTIC ACID, VENOUS: 0.87 mmol/L (ref 0.5–1.9)

## 2018-06-12 LAB — TROPONIN I: Troponin I: <0.03 ng/mL (ref <0.03)

## 2018-06-12 LAB — BRAIN NATRIURETIC PEPTIDE
B NATRIURETIC PEPTIDE 5: UNDETERMINED pg/mL (ref 0.0–100.0)
B Natriuretic Peptide: 95 pg/mL (ref 0.0–100.0)

## 2018-06-12 MED ORDER — SODIUM CHLORIDE 0.9 % IV BOLUS
500.0000 mL | Freq: Once | INTRAVENOUS | Status: AC
  Administered 2018-06-12: 500 mL via INTRAVENOUS

## 2018-06-12 MED ORDER — SODIUM CHLORIDE 0.9 % IV SOLN
Freq: Once | INTRAVENOUS | Status: AC
  Administered 2018-06-12: 13:00:00 via INTRAVENOUS

## 2018-06-12 MED ORDER — OXYCODONE HCL 5 MG PO TABS
5.0000 mg | ORAL_TABLET | Freq: Once | ORAL | Status: AC
  Administered 2018-06-12: 5 mg via ORAL
  Filled 2018-06-12: qty 1

## 2018-06-12 MED ORDER — IOPAMIDOL (ISOVUE-370) INJECTION 76%
100.0000 mL | Freq: Once | INTRAVENOUS | Status: AC | PRN
  Administered 2018-06-12: 100 mL via INTRAVENOUS

## 2018-06-12 NOTE — ED Notes (Signed)
Main lab called about lab samples being contaminated. Will recollect and send back to main lab

## 2018-06-12 NOTE — Discharge Instructions (Signed)
Return for worsening abdominal pain nausea or vomiting.  Drink plenty of fluids.  You could also discuss pain management with your family doctor and see if they would recommend a different regimen for you.  Return to the ED for a repeat syncopal events.  If you start having chest pain or headache you should also return.

## 2018-06-12 NOTE — ED Notes (Signed)
Attempted IV access x2. Unable to obtain. Pt has been a  difficult stick in the past needing IV team.

## 2018-06-12 NOTE — ED Triage Notes (Signed)
Pt arrives to ED from home with complaints of two near syncopal episodes per family over the past 12 hours. EMS reports pt just cam home from knee surgery yesterday, pt's main complaint is constipation stating he has not had a BM in 4-5 days. Pt is alert and oriented, not walking well on his knee yet. Pt placed in position of comfort with bed locked and lowered, call bell in reach.

## 2018-06-12 NOTE — ED Notes (Signed)
IV team at bedside 

## 2018-06-12 NOTE — ED Notes (Signed)
Pt verbalizes understanding of d/c instructions. Pt taken to lobby in wheelchair at d/c with all belongings and with family.   

## 2018-06-12 NOTE — ED Provider Notes (Signed)
[facility] EMERGENCY DEPARTMENT Provider Note   CSN: [id number] Arrival date & time: 06/12/18  1002     History   Chief Complaint Chief Complaint  Patient presents with  . Near Syncope    HPI Andrew Bonilla is a 84 y.o. male.  HPI Patient is status post total left knee arthroplasty by Dr. Wynelle Link [id number].  Patient was discharged from the hospital yesterday.  He reports he felt that he was doing all right at home.  He has started eating and drinking normally.  Yesterday evening however, was using his walker to go back and forth to the kitchen.  He became very lightheaded and pale.  Family members were at his side.  They report he had near complete loss of consciousness and did not appear well.  He subsequently came back around and felt improved.  He continued to take his pain medications as scheduled.  This morning, he had a second episode of near complete loss of consciousness.  He denies he is experiencing proceeding chest pain or headache.  He does report with a exertion he feels that he is getting "low on oxygen".  He denies fever or cough.  He reports his knee pain is controlled by the oxycodone that he is taking at home.  Patient is on Xarelto prophylactically.  Patient reports he has not had a bowel movement since his surgery.  That has been approximately 4 days.  He denies he has pain. Past Medical History:  Diagnosis Date  . Arthritis   . Constipation   . Deaf, left    WEARS HEARING AID RIGHT EAR   . GERD (gastroesophageal reflux disease)   . Hard of hearing   . Heart murmur    mod-severe aortic stenosis   . Hyperlipemia   . Hypertension   . Pneumonia    in college  . Prostate enlargement   . Recurrent upper respiratory infection (URI)    12/2011  . S/P AVR (aortic valve replacement) 01/16/2012   [facility] Ease 58mm pericardial tissue valve    Patient Active Problem List   Diagnosis Date Noted  . Osteoarthritis of knee 06/10/2018  . OA  (osteoarthritis) of knee 06/09/2018  . Bilateral carotid artery stenosis 11/30/2016  . Chest wall pain 09/13/2015  . Obesity (BMI 35.0-39.9 without comorbidity) 08/04/2014  . Cough 08/21/2013  . Near syncope 08/21/2013  . Bradycardia 08/21/2013  . Essential hypertension 12/15/2011  . Mixed hyperlipidemia 12/15/2011  . Constipation 12/15/2011  . S/P AVR (aortic valve replacement) 12/15/2011    Past Surgical History:  Procedure Laterality Date  . AORTIC VALVE REPLACEMENT  01/17/2012   Procedure: AORTIC VALVE REPLACEMENT (AVR);  Surgeon: Ivin Poot, MD;  Location: [facility];  Service: Open Heart Surgery;  Laterality: N/A;  . CARDIAC CATHETERIZATION  12/17/2011   mild nonobstructive CAD - 20% LAD stenosis, 30% OM stenosis, 20% Cfx, 20% RCA lesion  . JOINT REPLACEMENT     Left total knee Dr. Wynelle Link 06-09-18  . LEFT HEART CATHETERIZATION WITH CORONARY ANGIOGRAM N/A 12/17/2011   Procedure: LEFT HEART CATHETERIZATION WITH CORONARY ANGIOGRAM;  Surgeon: Pixie Casino, MD;  Location: [facility] CATH LAB;  Service: Cardiovascular;  Laterality: N/A;  . TEE WITHOUT CARDIOVERSION  01/2012   EF 50-55%; calcified AV annulus, severely calcified AV leaflets, mild regurg (AVR)  . TOTAL KNEE ARTHROPLASTY Left 06/09/2018   Procedure: LEFT TOTAL KNEE ARTHROPLASTY;  Surgeon: Gaynelle Arabian, MD;  Location: [facility];  Service: Orthopedics;  Laterality: Left;  Home Medications    Prior to Admission medications   Medication Sig Start Date End Date Taking? Authorizing Provider  amoxicillin (AMOXIL) 500 MG capsule Take 4 capsules (2,000 mg total) by mouth once. TAKE 2 HOURS BEFORE ANY DENTAL PROCEDURE 08/04/14  Yes Prescott Gum, Collier Salina, MD  atorvastatin (LIPITOR) 40 MG tablet Take 40 mg by mouth daily.   Yes [provider]  bisacodyl (DULCOLAX) 5 MG EC tablet Take 10 mg by mouth daily as needed for mild constipation or moderate constipation.   Yes [provider]  esomeprazole (NEXIUM) 40 MG  capsule Take 40 mg by mouth 2 (two) times daily before a meal.    Yes [provider]  hydrochlorothiazide (HYDRODIURIL) 25 MG tablet Take 25 mg by mouth daily.   Yes [provider]  irbesartan (AVAPRO) 75 MG tablet Take 1 tablet (75 mg total) by mouth daily. 01/14/18  Yes Hilty, Nadean Corwin, MD  methocarbamol (ROBAXIN) 500 MG tablet Take 1 tablet (500 mg total) by mouth every 6 (six) hours as needed for muscle spasms. 06/11/18  Yes Edmisten, Kristie L, PA  oxyCODONE (OXY IR/ROXICODONE) 5 MG immediate release tablet Take 1-2 tablets (5-10 mg total) by mouth every 6 (six) hours as needed for moderate pain (pain score 4-6). 06/11/18  Yes Edmisten, Kristie L, PA  polyethylene glycol (MIRALAX / GLYCOLAX) packet Take 17 g by mouth daily as needed.   Yes [provider]  tadalafil (CIALIS) 10 MG tablet Take 10 mg by mouth daily as needed for erectile dysfunction.    Yes [provider]  rivaroxaban (XARELTO) 10 MG TABS tablet Take 1 tablet (10 mg total) by mouth daily with breakfast for 19 days. Then resume one 81 mg aspirin once a day. 06/11/18 06/30/18  Edmisten, Ok Anis, PA    Family History Family History  Problem Relation Age of Onset  . Coronary artery disease Father 41       MI/smoker  . Colon cancer Brother   . Ovarian cancer Sister   . COPD Sister     Social History Social History   Tobacco Use  . Smoking status: Never Smoker  . Smokeless tobacco: Never Used  Substance Use Topics  . Alcohol use: Not Currently    Alcohol/week: 1.0 - 2.0 standard drinks    Types: 1 - 2 Standard drinks or equivalent per week    Comment: SOCIAL   . Drug use: Never     Allergies   Patient has no known allergies.   Review of Systems Review of Systems 10 Systems reviewed and are negative for acute change except as noted in the HPI.  Physical Exam Updated Vital Signs BP (!) 134/54   Pulse 74   Resp 17   Ht 5\' 7"  (1.702 m)   Wt 102 kg   SpO2 99%   BMI  35.22 kg/m   Physical Exam  Constitutional: He is oriented to person, place, and time.  Patient is alert and nontoxic.  No respiratory distress.  Normal mental status.  HENT:  Head: Normocephalic and atraumatic.  Mouth/Throat: Oropharynx is clear and moist.  Eyes: EOM are normal.  Neck: Neck supple.  Cardiovascular: Normal rate, regular rhythm, normal heart sounds and intact distal pulses.  Pulmonary/Chest: Effort normal and breath sounds normal.  Abdominal: Soft. He exhibits no distension. There is no tenderness. There is no guarding.  Musculoskeletal:  Left knee has surgical incision.  Knee is examined.  There is swelling but appears consistent with postoperative  findings.  No erythema.  Calf is soft and nontender.  Dorsalis pedis pulses 2+.  Neurological: He is alert and oriented to person, place, and time. No cranial nerve deficit. He exhibits normal muscle tone. Coordination normal.  Skin: Skin is warm and dry.  Psychiatric: He has a normal mood and affect.     ED Treatments / Results  Labs (all labs ordered are listed, but only abnormal results are displayed) Labs Reviewed  URINALYSIS, ROUTINE W REFLEX MICROSCOPIC - Abnormal; Notable for the following components:      Result Value   pH 9.0 (*)    All other components within normal limits  CBC WITH DIFFERENTIAL/PLATELET - Abnormal; Notable for the following components:   WBC 11.3 (*)    RBC 3.49 (*)    Hemoglobin 10.8 (*)    HCT 31.8 (*)    Monocytes Absolute 1.2 (*)    All other components within normal limits  PROTIME-INR - Abnormal; Notable for the following components:   Prothrombin Time 16.5 (*)    All other components within normal limits  BRAIN NATRIURETIC PEPTIDE  CBC WITH DIFFERENTIAL/PLATELET  PROTIME-INR  BRAIN NATRIURETIC PEPTIDE  COMPREHENSIVE METABOLIC PANEL  TROPONIN I  I-STAT CG4 LACTIC ACID, ED  I-STAT CG4 LACTIC ACID, ED    EKG EKG Interpretation  Date/Time:  Thursday June 12 2018  10:06:07 EDT Ventricular Rate:  73 PR Interval:    QRS Duration: 118 QT Interval:  401 QTC Calculation: 442 R Axis:   5 Text Interpretation:  Sinus rhythm Nonspecific intraventricular conduction delay Borderline repolarization abnormality no sig change from previous Confirmed by Charlesetta Shanks [phone]) on 06/12/2018 11:16:59 AM   Radiology Dg Chest 2 View  Result Date: 06/12/2018 CLINICAL DATA:  Knee replacement 06/09/2018.  Syncope, dizziness EXAM: CHEST - 2 VIEW COMPARISON:  08/08/2016 FINDINGS: Aortic valve replacement. Mild cardiac enlargement without heart failure. Lungs are clear without infiltrate or effusion. IMPRESSION: No active cardiopulmonary disease. Electronically Signed   By: Franchot Gallo M.D.   On: 06/12/2018 12:23    Procedures Procedures (including critical care time)  Medications Ordered in ED Medications  0.9 %  sodium chloride infusion ( Intravenous New Bag/Given 06/12/18 1312)  oxyCODONE (Oxy IR/ROXICODONE) immediate release tablet 5 mg (5 mg Oral Given 06/12/18 1350)  sodium chloride 0.9 % bolus 500 mL (0 mLs Intravenous Stopped 06/12/18 1526)     Initial Impression / Assessment and Plan / ED Course  I have reviewed the triage vital signs and the nursing notes.  Pertinent labs & imaging results that were available during my care of the patient were reviewed by me and considered in my medical decision making (see chart for details).  Clinical Course as of Jun 12 1604  Thu Jun 12, 2018  1538 Labs ordered at 11: 18.  First delay due to reportedly incorrect draw technique.  Labs had to be repeated.  Now, complete metabolic panel still not resulted.  Review of order set appears that it was never reordered when it required a redraw.  All other redraws appear to have been reordered.  Requesting that nursing staff determine what the delay is.   [MP]    Clinical Course User Index [MP] Charlesetta Shanks, MD   Plan to proceed with CT PE study once renal function  determined to be adequate.  Final Clinical Impressions(s) / ED Diagnoses   Final diagnoses:  Syncope and collapse  Postoperative state  Drug-induced constipation    ED Discharge Orders    None  Charlesetta Shanks, MD 06/12/18 [phone]

## 2018-06-12 NOTE — ED Provider Notes (Signed)
I received the patient in signout from Dr. Vallery Ridge.  Briefly the patient is an 84 year old male with a chief complaint of a near syncopal event.  This is happened twice.  Both when he had gotten up to quickly go somewhere.  He had recently had a left knee replacement.  There is some concern for possible pulmonary embolism and so he is awaiting CT scan.  The family called me into the room as he was heading to the CAT scanner and they are requesting a CT of the abdomen pelvis as well.  He had been having issues passing his bowels and felt that he was having decreased flatus.  Was complaining of some abdominal tenderness as well.  CT scan added on.  The patient CT scans were unremarkable.  Negative for pulmonary embolism.  I discussed with the family at length the results of his lab work.  He is feeling better after a bolus of IV fluids.  We will have him drink plenty of fluids at home.  Have him follow-up with his family doctor.     Deno Etienne, DO 06/12/18 [phone]

## 2018-06-12 NOTE — ED Notes (Signed)
Main lab is running the blood work, they are down one machine so it is taking a little more time to get through the tests.

## 2018-06-12 NOTE — ED Notes (Signed)
In contact with main lab to run the CMP blood work

## 2018-06-12 NOTE — ED Notes (Signed)
Patient transported to X-ray 

## 2018-06-13 ENCOUNTER — Observation Stay (HOSPITAL_COMMUNITY)
Admission: EM | Admit: 2018-06-13 | Discharge: 2018-06-15 | Disposition: A | Discharge status: Home Health | Payer: Medicare Other | Attending: Internal Medicine | Admitting: Internal Medicine

## 2018-06-13 ENCOUNTER — Encounter (HOSPITAL_COMMUNITY): Payer: Self-pay

## 2018-06-13 ENCOUNTER — Telehealth: Payer: Self-pay

## 2018-06-13 ENCOUNTER — Emergency Department (HOSPITAL_COMMUNITY): Payer: Medicare Other

## 2018-06-13 ENCOUNTER — Other Ambulatory Visit: Payer: Self-pay

## 2018-06-13 DIAGNOSIS — E871 Hypo-osmolality and hyponatremia: Secondary | ICD-10-CM | POA: Diagnosis not present

## 2018-06-13 DIAGNOSIS — D649 Anemia, unspecified: Secondary | ICD-10-CM | POA: Diagnosis present

## 2018-06-13 DIAGNOSIS — R55 Syncope and collapse: Secondary | ICD-10-CM | POA: Diagnosis not present

## 2018-06-13 DIAGNOSIS — Z8249 Family history of ischemic heart disease and other diseases of the circulatory system: Secondary | ICD-10-CM | POA: Insufficient documentation

## 2018-06-13 DIAGNOSIS — Z79899 Other long term (current) drug therapy: Secondary | ICD-10-CM | POA: Insufficient documentation

## 2018-06-13 DIAGNOSIS — M179 Osteoarthritis of knee, unspecified: Secondary | ICD-10-CM | POA: Diagnosis present

## 2018-06-13 DIAGNOSIS — K5900 Constipation, unspecified: Secondary | ICD-10-CM | POA: Diagnosis not present

## 2018-06-13 DIAGNOSIS — Z7901 Long term (current) use of anticoagulants: Secondary | ICD-10-CM | POA: Insufficient documentation

## 2018-06-13 DIAGNOSIS — I2510 Atherosclerotic heart disease of native coronary artery without angina pectoris: Secondary | ICD-10-CM | POA: Diagnosis not present

## 2018-06-13 DIAGNOSIS — E876 Hypokalemia: Secondary | ICD-10-CM | POA: Insufficient documentation

## 2018-06-13 DIAGNOSIS — M1710 Unilateral primary osteoarthritis, unspecified knee: Secondary | ICD-10-CM | POA: Diagnosis present

## 2018-06-13 DIAGNOSIS — Z953 Presence of xenogenic heart valve: Secondary | ICD-10-CM | POA: Insufficient documentation

## 2018-06-13 DIAGNOSIS — M1712 Unilateral primary osteoarthritis, left knee: Secondary | ICD-10-CM

## 2018-06-13 DIAGNOSIS — Z7982 Long term (current) use of aspirin: Secondary | ICD-10-CM | POA: Diagnosis not present

## 2018-06-13 DIAGNOSIS — Z96652 Presence of left artificial knee joint: Secondary | ICD-10-CM | POA: Diagnosis not present

## 2018-06-13 DIAGNOSIS — K5903 Drug induced constipation: Secondary | ICD-10-CM

## 2018-06-13 DIAGNOSIS — E669 Obesity, unspecified: Secondary | ICD-10-CM | POA: Insufficient documentation

## 2018-06-13 DIAGNOSIS — K219 Gastro-esophageal reflux disease without esophagitis: Secondary | ICD-10-CM | POA: Insufficient documentation

## 2018-06-13 DIAGNOSIS — H9192 Unspecified hearing loss, left ear: Secondary | ICD-10-CM | POA: Insufficient documentation

## 2018-06-13 DIAGNOSIS — I10 Essential (primary) hypertension: Secondary | ICD-10-CM | POA: Diagnosis present

## 2018-06-13 DIAGNOSIS — E782 Mixed hyperlipidemia: Secondary | ICD-10-CM | POA: Diagnosis not present

## 2018-06-13 DIAGNOSIS — Z6836 Body mass index (BMI) 36.0-36.9, adult: Secondary | ICD-10-CM | POA: Insufficient documentation

## 2018-06-13 DIAGNOSIS — Z952 Presence of prosthetic heart valve: Secondary | ICD-10-CM

## 2018-06-13 DIAGNOSIS — D72829 Elevated white blood cell count, unspecified: Secondary | ICD-10-CM | POA: Diagnosis not present

## 2018-06-13 LAB — CBG MONITORING, ED: Glucose-Capillary: 99 mg/dL (ref 70–99)

## 2018-06-13 LAB — URINALYSIS, ROUTINE W REFLEX MICROSCOPIC
Bilirubin Urine: NEGATIVE
Glucose, UA: NEGATIVE mg/dL
HGB URINE DIPSTICK: NEGATIVE
Ketones, ur: NEGATIVE mg/dL
LEUKOCYTES UA: NEGATIVE
Nitrite: NEGATIVE
Protein, ur: NEGATIVE mg/dL
SPECIFIC GRAVITY, URINE: 1.016 (ref 1.005–1.030)
pH: 7 (ref 5.0–8.0)

## 2018-06-13 LAB — BASIC METABOLIC PANEL
ANION GAP: 12 (ref 5–15)
BUN: 17 mg/dL (ref 8–23)
CO2: 28 mmol/L (ref 22–32)
Calcium: 9.2 mg/dL (ref 8.9–10.3)
Chloride: 92 mmol/L — ABNORMAL LOW (ref 98–111)
Creatinine, Ser: 0.83 mg/dL (ref 0.61–1.24)
GFR calc Af Amer: >60 mL/min (ref >60)
GLUCOSE: 124 mg/dL — AB (ref 70–99)
POTASSIUM: 3.7 mmol/L (ref 3.5–5.1)
Sodium: 132 mmol/L — ABNORMAL LOW (ref 135–145)

## 2018-06-13 LAB — TROPONIN I

## 2018-06-13 LAB — CBC
HCT: 30.8 % — ABNORMAL LOW (ref 39.0–52.0)
HEMOGLOBIN: 10.4 g/dL — AB (ref 13.0–17.0)
MCH: 30.8 pg (ref 26.0–34.0)
MCHC: 33.8 g/dL (ref 30.0–36.0)
MCV: 91.1 fL (ref 80.0–100.0)
Platelets: 243 10*3/uL (ref 150–400)
RBC: 3.38 MIL/uL — AB (ref 4.22–5.81)
RDW: 12.7 % (ref 11.5–15.5)
WBC: 12.2 10*3/uL — ABNORMAL HIGH (ref 4.0–10.5)
nRBC: 0 % (ref 0.0–0.2)

## 2018-06-13 MED ORDER — SODIUM CHLORIDE 0.9% FLUSH
3.0000 mL | Freq: Two times a day (BID) | INTRAVENOUS | Status: DC
  Administered 2018-06-13 – 2018-06-15 (×2): 3 mL via INTRAVENOUS

## 2018-06-13 MED ORDER — ADULT MULTIVITAMIN W/MINERALS CH
1.0000 | ORAL_TABLET | Freq: Every day | ORAL | Status: DC
  Administered 2018-06-14 – 2018-06-15 (×2): 1 via ORAL
  Filled 2018-06-13 (×2): qty 1

## 2018-06-13 MED ORDER — ONDANSETRON HCL 4 MG/2ML IJ SOLN: 4.0000 mg | Freq: Four times a day (QID) | INTRAMUSCULAR | Status: DC | PRN

## 2018-06-13 MED ORDER — RIVAROXABAN 10 MG PO TABS
10.0000 mg | ORAL_TABLET | Freq: Every day | ORAL | Status: DC
  Administered 2018-06-13 – 2018-06-15 (×3): 10 mg via ORAL
  Filled 2018-06-13 (×3): qty 1

## 2018-06-13 MED ORDER — PANTOPRAZOLE SODIUM 40 MG PO TBEC
40.0000 mg | DELAYED_RELEASE_TABLET | Freq: Two times a day (BID) | ORAL | Status: DC
  Administered 2018-06-14 – 2018-06-15 (×4): 40 mg via ORAL
  Filled 2018-06-13 (×4): qty 1

## 2018-06-13 MED ORDER — MAGNESIUM HYDROXIDE 400 MG/5ML PO SUSP
30.0000 mL | Freq: Every day | ORAL | Status: DC | PRN
  Filled 2018-06-13: qty 30

## 2018-06-13 MED ORDER — SODIUM CHLORIDE 0.9 % IV SOLN
INTRAVENOUS | Status: DC
  Administered 2018-06-13: 19:00:00 via INTRAVENOUS

## 2018-06-13 MED ORDER — METHOCARBAMOL 500 MG PO TABS: 500.0000 mg | ORAL_TABLET | Freq: Four times a day (QID) | ORAL | Status: DC | PRN

## 2018-06-13 MED ORDER — OXYCODONE HCL 5 MG PO TABS
2.5000 mg | ORAL_TABLET | Freq: Four times a day (QID) | ORAL | Status: DC | PRN
  Administered 2018-06-14 – 2018-06-15 (×3): 5 mg via ORAL
  Filled 2018-06-13 (×3): qty 1

## 2018-06-13 MED ORDER — ACETAMINOPHEN 650 MG RE SUPP: 650.0000 mg | Freq: Four times a day (QID) | RECTAL | Status: DC | PRN

## 2018-06-13 MED ORDER — SORBITOL 70 % SOLN
30.0000 mL | Freq: Every day | Status: DC | PRN
  Filled 2018-06-13: qty 30

## 2018-06-13 MED ORDER — RIVAROXABAN 10 MG PO TABS
10.0000 mg | ORAL_TABLET | Freq: Every day | ORAL | Status: DC
Start: 2018-06-14 — End: 2018-06-13

## 2018-06-13 MED ORDER — IRBESARTAN 150 MG PO TABS
75.0000 mg | ORAL_TABLET | Freq: Every day | ORAL | Status: DC
  Administered 2018-06-14 – 2018-06-15 (×2): 75 mg via ORAL
  Filled 2018-06-13 (×2): qty 1

## 2018-06-13 MED ORDER — ATORVASTATIN CALCIUM 40 MG PO TABS
40.0000 mg | ORAL_TABLET | Freq: Every day | ORAL | Status: DC
  Administered 2018-06-14 – 2018-06-15 (×2): 40 mg via ORAL
  Filled 2018-06-13 (×2): qty 1

## 2018-06-13 MED ORDER — SODIUM CHLORIDE 0.9 % IV SOLN
INTRAVENOUS | Status: AC
  Administered 2018-06-13: 22:00:00 via INTRAVENOUS

## 2018-06-13 MED ORDER — ONDANSETRON HCL 4 MG PO TABS: 4.0000 mg | ORAL_TABLET | Freq: Four times a day (QID) | ORAL | Status: DC | PRN

## 2018-06-13 MED ORDER — ACETAMINOPHEN 325 MG PO TABS: 650.0000 mg | ORAL_TABLET | Freq: Four times a day (QID) | ORAL | Status: DC | PRN

## 2018-06-13 MED ORDER — MAGNESIUM CITRATE PO SOLN
1.0000 | Freq: Once | ORAL | Status: AC
  Administered 2018-06-14: 1 via ORAL
  Filled 2018-06-13: qty 296

## 2018-06-13 NOTE — ED Triage Notes (Signed)
Patient is post op day 5 for left knee surgery.. Patient was discharged from the hospital on day 3. Patient had a syncopal episode on that day. Patient has had 3 more syncopal episodes. Patient was seen at [facility] ED yesterday. Patient has had 2 more syncopal episodes since released from [facility] ED. Patient's daughter reports that the patient has had orthostatic changes.  No BM in 5 days.

## 2018-06-13 NOTE — Progress Notes (Signed)
ED TO INPATIENT HANDOFF REPORT  Name/Age/Gender Andrew Bonilla 84 y.o. male  Code Status Code Status History    Date Active Date Inactive Code Status Order ID Comments User Context   06/09/2018 1420 06/11/2018 1715 Full Code [id number]  Gaynelle Arabian, MD Inpatient   01/19/2012 0907 01/21/2012 1707 Full Code [id number]  Rexene Alberts, MD Inpatient   12/15/2011 0430 12/18/2011 1840 Full Code [id number]  Purvis Kilts, RN Inpatient    Advance Directive Documentation     Most Recent Value  Type of Advance Directive  Healthcare Power of Attorney, Living will  Pre-existing out of facility DNR order (yellow form or pink MOST form)  -  "MOST" Form in Place?  -      Home/SNF/Other Home  Chief Complaint Post-Op/Fainting  Level of Care/Admitting Diagnosis ED Disposition    ED Disposition Condition [place]: [place] [100102]  Level of Care: Telemetry [5]  Admit to tele based on following criteria: Eval of Syncope  Diagnosis: Syncope [206001]  Admitting Physician: Vianne Bulls [[id number]]  Attending Physician: Vianne Bulls [[id number]]  PT Class (Do Not Modify): Observation [104]  PT Acc Code (Do Not Modify): Observation [10022]       Medical History Past Medical History:  Diagnosis Date  . Arthritis   . Constipation   . Deaf, left    WEARS HEARING AID RIGHT EAR   . GERD (gastroesophageal reflux disease)   . Hard of hearing   . Heart murmur    mod-severe aortic stenosis   . Hyperlipemia   . Hypertension   . Pneumonia    in college  . Prostate enlargement   . Recurrent upper respiratory infection (URI)    12/2011  . S/P AVR (aortic valve replacement) 01/16/2012   [facility] Ease 45m pericardial tissue valve    Allergies No Known Allergies  IV Location/Drains/Wounds Patient Lines/Drains/Airways Status   Active Line/Drains/Airways    Name:   Placement date:   Placement time:   Site:   Days:   Peripheral IV 06/13/18  Left Antecubital   06/13/18    1832    Antecubital   less than 1   Incision (Closed) 06/09/18 Knee Left   06/09/18    1122     4          Labs/Imaging Results for orders placed or performed during the hospital encounter of 06/13/18 (from the past 48 hour(s))  CBG monitoring, ED     Status: None   Collection Time: 06/13/18  5:49 PM  Result Value Ref Range   Glucose-Capillary 99 70 - 99 mg/dL  Basic metabolic panel     Status: Abnormal   Collection Time: 06/13/18  6:31 PM  Result Value Ref Range   Sodium 132 (L) 135 - 145 mmol/L   Potassium 3.7 3.5 - 5.1 mmol/L   Chloride 92 (L) 98 - 111 mmol/L   CO2 28 22 - 32 mmol/L   Glucose, Bld 124 (H) 70 - 99 mg/dL   BUN 17 8 - 23 mg/dL   Creatinine, Ser 0.83 0.61 - 1.24 mg/dL   Calcium 9.2 8.9 - 10.3 mg/dL   GFR calc non Af Amer >60 >60 mL/min   GFR calc Af Amer >60 >60 mL/min    Comment: (NOTE) The eGFR has been calculated using the CKD EPI equation. This calculation has not been validated in all clinical situations. eGFR's persistently <60 mL/min signify possible Chronic Kidney  Disease.    Anion gap 12 5 - 15    Comment: Performed at [facility], [place] [address] [place], [place] [address]  CBC     Status: Abnormal   Collection Time: 06/13/18  6:31 PM  Result Value Ref Range   WBC 12.2 (H) 4.0 - 10.5 K/uL   RBC 3.38 (L) 4.22 - 5.81 MIL/uL   Hemoglobin 10.4 (L) 13.0 - 17.0 g/dL   HCT 30.8 (L) 39.0 - 52.0 %   MCV 91.1 80.0 - 100.0 fL   MCH 30.8 26.0 - 34.0 pg   MCHC 33.8 30.0 - 36.0 g/dL   RDW 12.7 11.5 - 15.5 %   Platelets 243 150 - 400 K/uL   nRBC 0.0 0.0 - 0.2 %    Comment: Performed at [facility], [place] [address] [place], [place] [address]  Urinalysis, Routine w reflex microscopic     Status: None   Collection Time: 06/13/18  6:31 PM  Result Value Ref Range   Color, Urine YELLOW YELLOW   APPearance CLEAR CLEAR   Specific Gravity, Urine 1.016 1.005 - 1.030   pH 7.0 5.0 - 8.0    Glucose, UA NEGATIVE NEGATIVE mg/dL   Hgb urine dipstick NEGATIVE NEGATIVE   Bilirubin Urine NEGATIVE NEGATIVE   Ketones, ur NEGATIVE NEGATIVE mg/dL   Protein, ur NEGATIVE NEGATIVE mg/dL   Nitrite NEGATIVE NEGATIVE   Leukocytes, UA NEGATIVE NEGATIVE    Comment: Performed at [place] [address] [place], [place] [address]  Troponin I     Status: None   Collection Time: 06/13/18  6:31 PM  Result Value Ref Range   Troponin I <0.03 <0.03 ng/mL    Comment: Performed at [facility], [place] [address], [place],  [address]   Dg Chest 2 View  Result Date: 06/12/2018 CLINICAL DATA:  Knee replacement 06/09/2018.  Syncope, dizziness EXAM: CHEST - 2 VIEW COMPARISON:  08/08/2016 FINDINGS: Aortic valve replacement. Mild cardiac enlargement without heart failure. Lungs are clear without infiltrate or effusion. IMPRESSION: No active cardiopulmonary disease. Electronically Signed   By: Franchot Gallo M.D.   On: 06/12/2018 12:23   Ct Head Wo Contrast  Result Date: 06/13/2018 CLINICAL DATA:  Altered level of consciousness. Recent knee replacement. EXAM: CT HEAD WITHOUT CONTRAST TECHNIQUE: Contiguous axial images were obtained from the base of the skull through the vertex without intravenous contrast. COMPARISON:  Head CT dated 12/15/2011. FINDINGS: Brain: Ventricles are stable in size and configuration. Mild chronic small vessel ischemic change noted within the deep periventricular white matter regions bilaterally. There is no mass, hemorrhage, edema or other evidence of acute parenchymal abnormality. No extra-axial hemorrhage. Vascular: Chronic calcified atherosclerotic changes of the large vessels at the skull base. No unexpected hyperdense vessel. Skull: Normal. Negative for fracture or focal lesion. Sinuses/Orbits: No acute finding. Other: None. IMPRESSION: No acute findings.  No intracranial mass, hemorrhage or edema. Electronically Signed   By: Franki Cabot M.D.   On: 06/13/2018 17:33   Ct Angio Chest Pe W/cm &/or Wo Cm  Result Date: 06/12/2018 CLINICAL DATA:  Recent left total knee arthroplasty on 06/09/2018 presents with dyspnea. EXAM: CT ANGIOGRAPHY CHEST WITH CONTRAST TECHNIQUE: Multidetector CT imaging of the chest was performed using the standard protocol during bolus administration of intravenous contrast. Multiplanar CT image reconstructions and MIPs were obtained to evaluate the vascular anatomy. CONTRAST:  142m ISOVUE-370 IOPAMIDOL (ISOVUE-370) INJECTION 76% COMPARISON:  Same day CXR and chest radiographs  dating back through 01/14/2012. FINDINGS: Cardiovascular: Cardiomegaly with aortic atherosclerosis and atherosclerosis at the origin of the great vessels. No pericardial effusion. Conventional branch pattern of the great vessels without significant stenosis. The ascending thoracic aorta measures up to 3.8 cm in caliber. Left main and three-vessel moderate coronary arteriosclerosis is noted. Median sternotomy sutures are in place with evidence of prior aortic valve replacement. No pulmonary embolus. No dilatation of the main pulmonary artery. Mediastinum/Nodes: No enlarged mediastinal, hilar, or axillary lymph nodes. Thyroid gland, trachea, and esophagus demonstrate no significant findings. Lungs/Pleura: Bibasilar dependent and subsegmental atelectasis within both lower lobes and lingula. No effusion or pneumothorax. Upper Abdomen: No acute abnormality. Musculoskeletal: Mild degenerative change of the thoracic spine without acute osseous appearing abnormality. Review of the MIP images confirms the above findings. IMPRESSION: 1. No acute pulmonary embolus. 2. Cardiomegaly with coronary arteriosclerosis. Status post aortic valve replacement without complicating features. 3. No active pulmonary disease. Aortic Atherosclerosis (ICD10-I70.0). Electronically Signed   By: Ashley Royalty M.D.   On: 06/12/2018 18:17   Ct Abdomen Pelvis W  Contrast  Result Date: 06/12/2018 CLINICAL DATA:  Acute generalized abdominal pain and constipation. Recent left knee arthroplasty 06/09/2018 EXAM: CT ABDOMEN AND PELVIS WITH CONTRAST TECHNIQUE: Multidetector CT imaging of the abdomen and pelvis was performed using the standard protocol following bolus administration of intravenous contrast. CONTRAST:  165m ISOVUE-370 IOPAMIDOL (ISOVUE-370) INJECTION 76% as part of the chest, abdomen and pelvic CT studies. COMPARISON:  12/15/2011 radiographs, 09/26/2004 CT FINDINGS: Lower chest: The included heart size is enlarged. No pericardial effusion. Status post aortic valve replacement. Coronary arteriosclerosis. Atelectasis noted in the right lower lobe and lingula. Hepatobiliary: Colonic interposition over the liver shadow. Homogeneous attenuation of the liver without mass. No biliary dilatation with the gallbladder is normal. Pancreas: 16 mm fatty lesion in the pancreatic head compatible with a small lipoma of the pancreas, unchanged in appearance to slightly smaller. No ductal dilatation or inflammation. Spleen: Normal size spleen without mass Adrenals/Urinary Tract: Normal bilateral adrenal glands. Simple cyst arising off the upper pole the left kidney measuring approximately 4.9 cm in diameter. No obstructive uropathy. The urinary bladder is unremarkable for the degree of distention. Stomach/Bowel: Moderate stool retention within the right colon. Gaseous distention of large bowel is otherwise noted without mechanical bowel obstruction. The stomach and small intestine are nonacute. Normal-appearing appendix. The distal and terminal ileum are within normal limits. Vascular/Lymphatic: Moderate marked aortoiliac atherosclerosis without aneurysm. Patent branch vessels off the aorta. No lymphadenopathy. Reproductive: Prostatomegaly with the prostate measuring approximately 6 x 4.9 cm x 5.2 cm. Other: No free air nor free fluid. Musculoskeletal: Levoconvex curvature of the  lumbar spine with lower lumbar degenerative disc disease most pronounced at L4-5. Osteoarthritic joint space narrowing of both hips. No acute nor suspicious osseous lesions. IMPRESSION: 1. Stable to slightly smaller lipoma of the pancreatic head now measuring 1.6 cm versus 2 cm previously. 2. Simple cyst arising off the upper pole the left kidney measuring approximately 4.9 cm in diameter. No obstructive uropathy. 3. Moderate stool retention within the right colon. No bowel obstruction or inflammation. Normal appendix. 4. Prostatomegaly with the prostate measuring 6 x 4.9 x 5.2 cm. 5. Levoconvex curvature of the lumbar spine with lower lumbar degenerative disc disease most pronounced at L4-5 with vacuum disc phenomena. 6. Cardiomegaly with aortic valvular replacement and coronary arteriosclerosis. Electronically Signed   By: DAshley RoyaltyM.D.   On: 06/12/2018 18:12    Pending Labs Unresulted Labs (From admission, onward)  None      Vitals/Pain Today's Vitals   06/13/18 1603 06/13/18 1605 06/13/18 1845 06/13/18 1853  BP:   (!) 163/83 (!) 163/83  Pulse:   99 79  Resp:   20 20  Temp:      TempSrc:      SpO2:   100% 100%  Weight:  102 kg    Height:  5' 7"  (1.702 m)    PainSc: 4        Isolation Precautions No active isolations  Medications Medications  0.9 %  sodium chloride infusion ( Intravenous New Bag/Given 06/13/18 1832)    Mobility walks with device

## 2018-06-13 NOTE — H&P (Signed)
History and Physical    Andrew Bonilla DXI:[id number] DOB: [birth year] DOA: 06/13/2018  PCP: Wenda Low, MD   Patient coming from: Home  Chief Complaint: Recurrent syncope   HPI: Andrew Bonilla is a 84 y.o. male with medical history significant for hypertension, hyperlipidemia, status post aortic valve repair, and osteoarthritis status post total knee arthroplasty on 06/09/2018, now presenting to the emergency department for evaluation of recurrent syncope.  Patient is accompanied by his family who have witnessed some of these episodes.  Patient went home following his knee surgery, had syncope upon standing back at home, was evaluated in the emergency department at that time, found to have orthostatic hypotension, and was given IV fluids in the ED with improvement.  He had also been complaining of constipation and underwent CT the abdomen and pelvis with no acute findings.  Patient has also had some syncopal episodes while straining to move his bowels that were accompanied by nausea, feeling "clammy," and lightheadedness, but no chest pain or palpitations.  He had another episode today shortly after getting up from a seated position.  Family noted that his eyes seem to deviate up into the right and there was a momentary jerking of his arm.  He regained awareness within seconds, but continued to experience some nausea and malaise for several minutes.  He denies any chest pain or palpitations with any of these episodes, denies headache, change in vision or hearing, or any focal numbness or weakness.  Denies any history of seizure.  ED Course: Upon arrival to the ED, patient is found to be afebrile, saturating well on room air, slightly hypertensive, and with normal heart rate.  There was a 17 bpm increase in heart rate upon standing.  EKG features a sinus rhythm with borderline IVCD and repolarization abnormality.  Chemistry panel is notable for a mild hyponatremia and CBC features a mild leukocytosis  and normocytic anemia with hemoglobin 10.4.  Troponin is undetectable.  Patient was started on some IV fluids in the ED.  He remains hemodynamically stable, in no apparent distress, and will be observed for ongoing evaluation and management of recurrent syncopal episodes.  Review of Systems:  All other systems reviewed and apart from HPI, are negative.  Past Medical History:  Diagnosis Date  . Arthritis   . Constipation   . Deaf, left    WEARS HEARING AID RIGHT EAR   . GERD (gastroesophageal reflux disease)   . Hard of hearing   . Heart murmur    mod-severe aortic stenosis   . Hyperlipemia   . Hypertension   . Pneumonia    in college  . Prostate enlargement   . Recurrent upper respiratory infection (URI)    12/2011  . S/P AVR (aortic valve replacement) 01/16/2012   [facility] Ease 3mm pericardial tissue valve    Past Surgical History:  Procedure Laterality Date  . AORTIC VALVE REPLACEMENT  01/17/2012   Procedure: AORTIC VALVE REPLACEMENT (AVR);  Surgeon: Ivin Poot, MD;  Location: [facility];  Service: Open Heart Surgery;  Laterality: N/A;  . CARDIAC CATHETERIZATION  12/17/2011   mild nonobstructive CAD - 20% LAD stenosis, 30% OM stenosis, 20% Cfx, 20% RCA lesion  . JOINT REPLACEMENT     Left total knee Dr. Wynelle Link 06-09-18  . LEFT HEART CATHETERIZATION WITH CORONARY ANGIOGRAM N/A 12/17/2011   Procedure: LEFT HEART CATHETERIZATION WITH CORONARY ANGIOGRAM;  Surgeon: Pixie Casino, MD;  Location: [facility] CATH LAB;  Service: Cardiovascular;  Laterality: N/A;  .  TEE WITHOUT CARDIOVERSION  01/2012   EF 50-55%; calcified AV annulus, severely calcified AV leaflets, mild regurg (AVR)  . TOTAL KNEE ARTHROPLASTY Left 06/09/2018   Procedure: LEFT TOTAL KNEE ARTHROPLASTY;  Surgeon: Gaynelle Arabian, MD;  Location: [facility];  Service: Orthopedics;  Laterality: Left;     reports that he has never smoked. He has never used smokeless tobacco. He reports that he drank about 1.0 - 2.0 standard drinks  of alcohol per week. He reports that he does not use drugs.  No Known Allergies  Family History  Problem Relation Age of Onset  . Coronary artery disease Father 95       MI/smoker  . Colon cancer Brother   . Ovarian cancer Sister   . COPD Sister      Prior to Admission medications   Medication Sig Start Date End Date Taking? Authorizing Provider  amoxicillin (AMOXIL) 500 MG capsule Take 4 capsules (2,000 mg total) by mouth once. TAKE 2 HOURS BEFORE ANY DENTAL PROCEDURE 08/04/14  Yes Prescott Gum, Collier Salina, MD  atorvastatin (LIPITOR) 40 MG tablet Take 40 mg by mouth daily.   Yes [provider]  bisacodyl (DULCOLAX) 5 MG EC tablet Take 10 mg by mouth daily as needed for mild constipation or moderate constipation.   Yes [provider]  docusate sodium (COLACE) 100 MG capsule Take 100 mg by mouth 2 (two) times daily.   Yes [provider]  esomeprazole (NEXIUM) 40 MG capsule Take 40 mg by mouth 2 (two) times daily before a meal.    Yes [provider]  hydrochlorothiazide (HYDRODIURIL) 25 MG tablet Take 25 mg by mouth daily.   Yes [provider]  hydrocortisone cream 1 % Apply 1 application topically 2 (two) times daily as needed (rash).   Yes [provider]  irbesartan (AVAPRO) 75 MG tablet Take 1 tablet (75 mg total) by mouth daily. 01/14/18  Yes Hilty, Nadean Corwin, MD  methocarbamol (ROBAXIN) 500 MG tablet Take 1 tablet (500 mg total) by mouth every 6 (six) hours as needed for muscle spasms. 06/11/18  Yes Edmisten, Kristie L, PA  OVER THE COUNTER MEDICATION Take 1 Dose by mouth daily. Smooth move tea. Has senna to help with constipation   Yes [provider]  oxyCODONE (OXY IR/ROXICODONE) 5 MG immediate release tablet Take 1-2 tablets (5-10 mg total) by mouth every 6 (six) hours as needed for moderate pain (pain score 4-6). Patient taking differently: Take 2.5-10 mg by mouth every 6 (six) hours as needed for moderate pain (pain score  4-6).  06/11/18  Yes Edmisten, Kristie L, PA  polyethylene glycol (MIRALAX / GLYCOLAX) packet Take 17 g by mouth daily as needed for mild constipation.    Yes [provider]  rivaroxaban (XARELTO) 10 MG TABS tablet Take 1 tablet (10 mg total) by mouth daily with breakfast for 19 days. Then resume one 81 mg aspirin once a day. 06/11/18 06/30/18 Yes Edmisten, Kristie L, PA  tadalafil (CIALIS) 10 MG tablet Take 10 mg by mouth daily as needed for erectile dysfunction.    Yes [provider]  aspirin EC 81 MG tablet Take 81 mg by mouth daily.    [provider]  BETA CAROTENE PO Take by mouth.    [provider]  Calcium-Magnesium-Zinc (CAL-MAG-ZINC PO) Take by mouth.    [provider]  cholecalciferol (VITAMIN D) 1000 units tablet Take 1,000 Units by mouth daily.    [provider]  Misc Natural  Products (GLUCOSAMINE CHONDROITIN ADV) TABS Take by mouth.    [provider]  Multiple Vitamin (MULTIVITAMIN WITH MINERALS) TABS tablet Take 1 tablet by mouth daily.    [provider]  Omega-3 Fatty Acids (OMEGA-3 FISH OIL PO) Take by mouth.    [provider]  Saw Palmetto, Serenoa repens, (SAW PALMETTO PO) Take by mouth.    [provider]  TURMERIC PO Take by mouth.    [provider]  vitamin E 400 UNIT capsule Take 400 Units by mouth daily.    [provider]    Physical Exam: Vitals:   06/13/18 1845 06/13/18 1853 06/13/18 1900 06/13/18 2000  BP: (!) 163/83 (!) 163/83    Pulse: 99 79 79 77  Resp: 20 20 (!) 25 (!) 21  Temp:      TempSrc:      SpO2: 100% 100% 99% 99%  Weight:      Height:        Constitutional: NAD, calm  Eyes: PERTLA, lids and conjunctivae normal ENMT: Mucous membranes are moist. Posterior pharynx clear of any exudate or lesions.   Neck: normal, supple, no masses, no thyromegaly Respiratory: clear to auscultation bilaterally, no wheezing, no crackles. Normal  respiratory effort.    Cardiovascular: S1 & S2 heard, regular rate and rhythm. No extremity edema.   Abdomen: No distension, no tenderness, soft. Bowel sounds normal.  Musculoskeletal: no clubbing / cyanosis. Left knee surgical incision clean, dry, and intact; slight edema and warmth to the left knee.     Skin: no significant rashes, lesions, ulcers. Warm, dry, well-perfused. Neurologic: CN 2-12 grossly intact. Sensation intact. Strength 5/5 in all 4 limbs.  Psychiatric:   Alert and oriented x 3. Very pleasant and cooperative.    Labs on Admission: I have personally reviewed following labs and imaging studies  CBC: Recent Labs  Lab 06/10/18 0517 06/11/18 0451 06/12/18 1239 06/12/18 1420 06/13/18 1831  WBC 13.6* 17.3* SPECIMEN CONTAMINATED, UNABLE TO PERFORM TEST(S). 11.3* 12.2*  NEUTROABS  --   --  SPECIMEN CONTAMINATED, UNABLE TO PERFORM TEST(S). 7.2  --   HGB 11.3* 10.8* SPECIMEN CONTAMINATED, UNABLE TO PERFORM TEST(S). 10.8* 10.4*  HCT 32.3* 31.7* SPECIMEN CONTAMINATED, UNABLE TO PERFORM TEST(S). 31.8* 30.8*  MCV 88.0 90.3 SPECIMEN CONTAMINATED, UNABLE TO PERFORM TEST(S). 91.1 91.1  PLT 283 278 SPECIMEN CONTAMINATED, UNABLE TO PERFORM TEST(S). 272 366   Basic Metabolic Panel: Recent Labs  Lab 06/10/18 0517 06/11/18 0451 06/12/18 1420 06/13/18 1831  NA 135 137 133* 132*  K 3.4* 4.0 4.0 3.7  CL 98 96* 94* 92*  CO2 26 27 27 28   GLUCOSE 128* 120* 115* 124*  BUN 11 14 13 17   CREATININE 0.86 0.84 0.91 0.83  CALCIUM 9.0 9.5 9.0 9.2   GFR: Estimated Creatinine Clearance: 76.8 mL/min (by C-G formula based on SCr of 0.83 mg/dL). Liver Function Tests: Recent Labs  Lab 06/12/18 1420  AST 34  ALT 27  ALKPHOS 47  BILITOT 1.1  PROT 6.5  ALBUMIN 3.5   No results for input(s): LIPASE, AMYLASE in the last 168 hours. No results for input(s): AMMONIA in the last 168 hours. Coagulation Profile: Recent Labs  Lab 06/12/18 1239 06/12/18 1420  INR SPECIMEN CONTAMINATED, UNABLE  TO PERFORM TEST(S). 1.34   Cardiac Enzymes: Recent Labs  Lab 06/12/18 1420 06/13/18 1831  TROPONINI <0.03 <0.03   BNP (last 3 results) No results for input(s): PROBNP in the last 8760 hours. HbA1C: No results for input(s): HGBA1C  in the last 72 hours. CBG: Recent Labs  Lab 06/13/18 1749  GLUCAP 99   Lipid Profile: No results for input(s): CHOL, HDL, LDLCALC, TRIG, CHOLHDL, LDLDIRECT in the last 72 hours. Thyroid Function Tests: No results for input(s): TSH, T4TOTAL, FREET4, T3FREE, THYROIDAB in the last 72 hours. Anemia Panel: No results for input(s): VITAMINB12, FOLATE, FERRITIN, TIBC, IRON, RETICCTPCT in the last 72 hours. Urine analysis:    Component Value Date/Time   COLORURINE YELLOW 06/13/2018 1831   APPEARANCEUR CLEAR 06/13/2018 1831   LABSPEC 1.016 06/13/2018 1831   PHURINE 7.0 06/13/2018 1831   GLUCOSEU NEGATIVE 06/13/2018 1831   HGBUR NEGATIVE 06/13/2018 1831   BILIRUBINUR NEGATIVE 06/13/2018 1831   KETONESUR NEGATIVE 06/13/2018 1831   PROTEINUR NEGATIVE 06/13/2018 1831   UROBILINOGEN 0.2 01/14/2012 1529   NITRITE NEGATIVE 06/13/2018 1831   LEUKOCYTESUR NEGATIVE 06/13/2018 1831   Sepsis Labs: @LABRCNTIP (procalcitonin:4,lacticidven:4) ) Recent Results (from the past 240 hour(s))  Surgical pcr screen     Status: None   Collection Time: 06/04/18 12:54 PM  Result Value Ref Range Status   MRSA, PCR NEGATIVE NEGATIVE Final   Staphylococcus aureus NEGATIVE NEGATIVE Final    Comment: (NOTE) The Xpert SA Assay (FDA approved for NASAL specimens in patients 5 years of age and older), is one component of a comprehensive surveillance program. It is not intended to diagnose infection nor to guide or monitor treatment. Performed at [facility], [place] [address], [place], [place] [address]      Radiological Exams on Admission: Dg Chest 2 View  Result Date: 06/12/2018 CLINICAL DATA:  Knee replacement 06/09/2018.  Syncope, dizziness EXAM:  CHEST - 2 VIEW COMPARISON:  08/08/2016 FINDINGS: Aortic valve replacement. Mild cardiac enlargement without heart failure. Lungs are clear without infiltrate or effusion. IMPRESSION: No active cardiopulmonary disease. Electronically Signed   By: Franchot Gallo M.D.   On: 06/12/2018 12:23   Ct Head Wo Contrast  Result Date: 06/13/2018 CLINICAL DATA:  Altered level of consciousness. Recent knee replacement. EXAM: CT HEAD WITHOUT CONTRAST TECHNIQUE: Contiguous axial images were obtained from the base of the skull through the vertex without intravenous contrast. COMPARISON:  Head CT dated 12/15/2011. FINDINGS: Brain: Ventricles are stable in size and configuration. Mild chronic small vessel ischemic change noted within the deep periventricular white matter regions bilaterally. There is no mass, hemorrhage, edema or other evidence of acute parenchymal abnormality. No extra-axial hemorrhage. Vascular: Chronic calcified atherosclerotic changes of the large vessels at the skull base. No unexpected hyperdense vessel. Skull: Normal. Negative for fracture or focal lesion. Sinuses/Orbits: No acute finding. Other: None. IMPRESSION: No acute findings.  No intracranial mass, hemorrhage or edema. Electronically Signed   By: Franki Cabot M.D.   On: 06/13/2018 17:33   Ct Angio Chest Pe W/cm &/or Wo Cm  Result Date: 06/12/2018 CLINICAL DATA:  Recent left total knee arthroplasty on 06/09/2018 presents with dyspnea. EXAM: CT ANGIOGRAPHY CHEST WITH CONTRAST TECHNIQUE: Multidetector CT imaging of the chest was performed using the standard protocol during bolus administration of intravenous contrast. Multiplanar CT image reconstructions and MIPs were obtained to evaluate the vascular anatomy. CONTRAST:  16mL ISOVUE-370 IOPAMIDOL (ISOVUE-370) INJECTION 76% COMPARISON:  Same day CXR and chest radiographs dating back through 01/14/2012. FINDINGS: Cardiovascular: Cardiomegaly with aortic atherosclerosis and atherosclerosis at the  origin of the great vessels. No pericardial effusion. Conventional branch pattern of the great vessels without significant stenosis. The ascending thoracic aorta measures up to 3.8 cm in caliber. Left main and three-vessel moderate  coronary arteriosclerosis is noted. Median sternotomy sutures are in place with evidence of prior aortic valve replacement. No pulmonary embolus. No dilatation of the main pulmonary artery. Mediastinum/Nodes: No enlarged mediastinal, hilar, or axillary lymph nodes. Thyroid gland, trachea, and esophagus demonstrate no significant findings. Lungs/Pleura: Bibasilar dependent and subsegmental atelectasis within both lower lobes and lingula. No effusion or pneumothorax. Upper Abdomen: No acute abnormality. Musculoskeletal: Mild degenerative change of the thoracic spine without acute osseous appearing abnormality. Review of the MIP images confirms the above findings. IMPRESSION: 1. No acute pulmonary embolus. 2. Cardiomegaly with coronary arteriosclerosis. Status post aortic valve replacement without complicating features. 3. No active pulmonary disease. Aortic Atherosclerosis (ICD10-I70.0). Electronically Signed   By: Ashley Royalty M.D.   On: 06/12/2018 18:17   Ct Abdomen Pelvis W Contrast  Result Date: 06/12/2018 CLINICAL DATA:  Acute generalized abdominal pain and constipation. Recent left knee arthroplasty 06/09/2018 EXAM: CT ABDOMEN AND PELVIS WITH CONTRAST TECHNIQUE: Multidetector CT imaging of the abdomen and pelvis was performed using the standard protocol following bolus administration of intravenous contrast. CONTRAST:  168mL ISOVUE-370 IOPAMIDOL (ISOVUE-370) INJECTION 76% as part of the chest, abdomen and pelvic CT studies. COMPARISON:  12/15/2011 radiographs, 09/26/2004 CT FINDINGS: Lower chest: The included heart size is enlarged. No pericardial effusion. Status post aortic valve replacement. Coronary arteriosclerosis. Atelectasis noted in the right lower lobe and lingula.  Hepatobiliary: Colonic interposition over the liver shadow. Homogeneous attenuation of the liver without mass. No biliary dilatation with the gallbladder is normal. Pancreas: 16 mm fatty lesion in the pancreatic head compatible with a small lipoma of the pancreas, unchanged in appearance to slightly smaller. No ductal dilatation or inflammation. Spleen: Normal size spleen without mass Adrenals/Urinary Tract: Normal bilateral adrenal glands. Simple cyst arising off the upper pole the left kidney measuring approximately 4.9 cm in diameter. No obstructive uropathy. The urinary bladder is unremarkable for the degree of distention. Stomach/Bowel: Moderate stool retention within the right colon. Gaseous distention of large bowel is otherwise noted without mechanical bowel obstruction. The stomach and small intestine are nonacute. Normal-appearing appendix. The distal and terminal ileum are within normal limits. Vascular/Lymphatic: Moderate marked aortoiliac atherosclerosis without aneurysm. Patent branch vessels off the aorta. No lymphadenopathy. Reproductive: Prostatomegaly with the prostate measuring approximately 6 x 4.9 cm x 5.2 cm. Other: No free air nor free fluid. Musculoskeletal: Levoconvex curvature of the lumbar spine with lower lumbar degenerative disc disease most pronounced at L4-5. Osteoarthritic joint space narrowing of both hips. No acute nor suspicious osseous lesions. IMPRESSION: 1. Stable to slightly smaller lipoma of the pancreatic head now measuring 1.6 cm versus 2 cm previously. 2. Simple cyst arising off the upper pole the left kidney measuring approximately 4.9 cm in diameter. No obstructive uropathy. 3. Moderate stool retention within the right colon. No bowel obstruction or inflammation. Normal appendix. 4. Prostatomegaly with the prostate measuring 6 x 4.9 x 5.2 cm. 5. Levoconvex curvature of the lumbar spine with lower lumbar degenerative disc disease most pronounced at L4-5 with vacuum disc  phenomena. 6. Cardiomegaly with aortic valvular replacement and coronary arteriosclerosis. Electronically Signed   By: Ashley Royalty M.D.   On: 06/12/2018 18:12    EKG: Independently reviewed. Sinus rhythm, borderline IVCD and repolarization abnormality.   Assessment/Plan  1. Syncope, recurrent   - Presents after a series of syncopal episodes, some with brief convulsive activity, none with chest pain or palpitation, noted to have positive orthostatics in ED 10/10 and treated with IVF  - There are features suggestive  of both orthostasis and vasovagal syncope, seizure is possible but less likely  - Continue cardiac monitoring, continue IVF hydration, check echocardiogram, repeat orthostatic vitals in am    2. Constipation  - Patient complains of constipation, straining to move bowels, no success with senna and MiraLax at home  - No obstruction on CT abd/pelvis performed in ED on 10/10 - Given mag citrate x1 now, then continue laxatives as needed    3. OA s/p left TKA on 06/09/18  - Appear to be healing appropriately  - Continue wound care, PT   4. Hypertension  - BP at goal  - Hold HCTZ while hydrating, continue ARB as tolerated    5. Hyponatremia  - Serum sodium is 132 on admission  - Possibly d/t hypovolemia and/or HCTZ  - Hold HCTZ, continue IVF hydration, repeat chem panel in am    6. Normocytic anemia  - Hgb is 10.4 on admission   - Appears to be stable and no bleeding noted     DVT prophylaxis: Xarelto, ppx dosing Code Status: Full  Family Communication: Family updated at bedside Consults called: none Admission status: Observation     Vianne Bulls, MD [facility] Pager [phone]  If 7PM-7AM, please contact night-coverage [email or web address] Password [email or web address]  06/13/2018, 10:01 PM

## 2018-06-13 NOTE — Telephone Encounter (Signed)
Patient's family contacted the office to request what the patient was taking after his surgery for pain with Dr. Prescott Gum in 2013.  Looking back through notes, it was noted that he was take only Tylenol for pain.  I advised family and they thanked me for the call.  Family stated that he has had several pre-syncopal episodes after having his knee recently replaced.  She stated that he was going to the ED today at [facility] to be re-evaluated.  Dr. Prescott Gum was aware of patient's recent trip to the emergency room and would like to see him back at his regular follow-up appointment.

## 2018-06-13 NOTE — Telephone Encounter (Signed)
This encounter was created in error - please disregard.

## 2018-06-13 NOTE — ED Provider Notes (Signed)
[place] DEPT Provider Note   CSN: [id number] Arrival date & time: 06/13/18  1537     History   Chief Complaint Chief Complaint  Patient presents with  . Loss of Consciousness  . Constipation    HPI Andrew Bonilla is a 84 y.o. male.  84 year old male presents after having syncopal episodes x2.  Was seen yesterday [facility] for similar symptoms and had a negative chest as well as abdominal CT.  Patient diagnosed at [facility] with orthostatic hypotension.  Patient recently had left knee replacement.  Patient states that he had been in the bed for possible 45 minutes and went to stand up became slightly dizzy when he sat down a chair his family states for about 2 minutes he passed out.  No seizure activity noted.  Symptoms resolved spontaneously.  He was then moved to a commode where he tried to pass his bowels and had a second syncopal event.  Did have loss of bladder function on the first episode.  No tongue biting.  No prior history of seizure disorder.  He denies any recent history of volume loss.  Feels back to his baseline at this time.     Past Medical History:  Diagnosis Date  . Arthritis   . Constipation   . Deaf, left    WEARS HEARING AID RIGHT EAR   . GERD (gastroesophageal reflux disease)   . Hard of hearing   . Heart murmur    mod-severe aortic stenosis   . Hyperlipemia   . Hypertension   . Pneumonia    in college  . Prostate enlargement   . Recurrent upper respiratory infection (URI)    12/2011  . S/P AVR (aortic valve replacement) 01/16/2012   [facility] Ease 50mm pericardial tissue valve    Patient Active Problem List   Diagnosis Date Noted  . Osteoarthritis of knee 06/10/2018  . OA (osteoarthritis) of knee 06/09/2018  . Bilateral carotid artery stenosis 11/30/2016  . Chest wall pain 09/13/2015  . Obesity (BMI 35.0-39.9 without comorbidity) 08/04/2014  . Cough 08/21/2013  . Near syncope 08/21/2013  . Bradycardia  08/21/2013  . Essential hypertension 12/15/2011  . Mixed hyperlipidemia 12/15/2011  . Constipation 12/15/2011  . S/P AVR (aortic valve replacement) 12/15/2011    Past Surgical History:  Procedure Laterality Date  . AORTIC VALVE REPLACEMENT  01/17/2012   Procedure: AORTIC VALVE REPLACEMENT (AVR);  Surgeon: Ivin Poot, MD;  Location: Center;  Service: Open Heart Surgery;  Laterality: N/A;  . CARDIAC CATHETERIZATION  12/17/2011   mild nonobstructive CAD - 20% LAD stenosis, 30% OM stenosis, 20% Cfx, 20% RCA lesion  . JOINT REPLACEMENT     Left total knee Dr. Wynelle Link 06-09-18  . LEFT HEART CATHETERIZATION WITH CORONARY ANGIOGRAM N/A 12/17/2011   Procedure: LEFT HEART CATHETERIZATION WITH CORONARY ANGIOGRAM;  Surgeon: Pixie Casino, MD;  Location: [facility] CATH LAB;  Service: Cardiovascular;  Laterality: N/A;  . TEE WITHOUT CARDIOVERSION  01/2012   EF 50-55%; calcified AV annulus, severely calcified AV leaflets, mild regurg (AVR)  . TOTAL KNEE ARTHROPLASTY Left 06/09/2018   Procedure: LEFT TOTAL KNEE ARTHROPLASTY;  Surgeon: Gaynelle Arabian, MD;  Location: [facility] ORS;  Service: Orthopedics;  Laterality: Left;        Home Medications    Prior to Admission medications   Medication Sig Start Date End Date Taking? Authorizing Provider  amoxicillin (AMOXIL) 500 MG capsule Take 4 capsules (2,000 mg total) by mouth once. TAKE 2 HOURS  BEFORE ANY DENTAL PROCEDURE 08/04/14   [place] Gum, Collier Salina, MD  atorvastatin (LIPITOR) 40 MG tablet Take 40 mg by mouth daily.    [provider]  bisacodyl (DULCOLAX) 5 MG EC tablet Take 10 mg by mouth daily as needed for mild constipation or moderate constipation.    [provider]  esomeprazole (NEXIUM) 40 MG capsule Take 40 mg by mouth 2 (two) times daily before a meal.     [provider]  hydrochlorothiazide (HYDRODIURIL) 25 MG tablet Take 25 mg by mouth daily.    [provider]  irbesartan (AVAPRO) 75 MG tablet Take 1 tablet (75 mg  total) by mouth daily. 01/14/18   Hilty, Nadean Corwin, MD  methocarbamol (ROBAXIN) 500 MG tablet Take 1 tablet (500 mg total) by mouth every 6 (six) hours as needed for muscle spasms. 06/11/18   Edmisten, Kristie L, PA  oxyCODONE (OXY IR/ROXICODONE) 5 MG immediate release tablet Take 1-2 tablets (5-10 mg total) by mouth every 6 (six) hours as needed for moderate pain (pain score 4-6). 06/11/18   Edmisten, Kristie L, PA  polyethylene glycol (MIRALAX / GLYCOLAX) packet Take 17 g by mouth daily as needed.    [provider]  rivaroxaban (XARELTO) 10 MG TABS tablet Take 1 tablet (10 mg total) by mouth daily with breakfast for 19 days. Then resume one 81 mg aspirin once a day. 06/11/18 06/30/18  Edmisten, Ok Anis, PA  tadalafil (CIALIS) 10 MG tablet Take 10 mg by mouth daily as needed for erectile dysfunction.     [provider]    Family History Family History  Problem Relation Age of Onset  . Coronary artery disease Father 34       MI/smoker  . Colon cancer Brother   . Ovarian cancer Sister   . COPD Sister     Social History Social History   Tobacco Use  . Smoking status: Never Smoker  . Smokeless tobacco: Never Used  Substance Use Topics  . Alcohol use: Not Currently    Alcohol/week: 1.0 - 2.0 standard drinks    Types: 1 - 2 Standard drinks or equivalent per week    Comment: SOCIAL   . Drug use: Never     Allergies   Patient has no known allergies.   Review of Systems Review of Systems  All other systems reviewed and are negative.    Physical Exam Updated Vital Signs BP 135/64 (BP Location: Left Arm)   Pulse 82   Temp 98.2 F (36.8 C) (Oral)   Resp 18   Ht 1.702 m (5\' 7" )   Wt 102 kg   SpO2 99%   BMI 35.22 kg/m   Physical Exam  Constitutional: He is oriented to person, place, and time. He appears well-developed and well-nourished.  Non-toxic appearance. No distress.  HENT:  Head: Normocephalic and atraumatic.  Eyes: Pupils are equal, round, and  reactive to light. Conjunctivae, EOM and lids are normal.  Neck: Normal range of motion. Neck supple. No tracheal deviation present. No thyroid mass present.  Cardiovascular: Normal rate, regular rhythm and normal heart sounds. Exam reveals no gallop.  No murmur heard. Pulmonary/Chest: Effort normal and breath sounds normal. No stridor. No respiratory distress. He has no decreased breath sounds. He has no wheezes. He has no rhonchi. He has no rales.  Abdominal: Soft. Normal appearance and bowel sounds are normal. He exhibits no distension. There is no tenderness. There is no rebound and no CVA tenderness.  Musculoskeletal: Normal  range of motion. He exhibits no edema or tenderness.       Legs: Neurological: He is alert and oriented to person, place, and time. He has normal strength. No cranial nerve deficit or sensory deficit. GCS eye subscore is 4. GCS verbal subscore is 5. GCS motor subscore is 6.  Skin: Skin is warm and dry. No abrasion and no rash noted.  Psychiatric: He has a normal mood and affect. His speech is normal and behavior is normal.  Nursing note and vitals reviewed.    ED Treatments / Results  Labs (all labs ordered are listed, but only abnormal results are displayed) Labs Reviewed  BASIC METABOLIC PANEL  CBC  URINALYSIS, ROUTINE W REFLEX MICROSCOPIC  CBG MONITORING, ED    EKG None  Radiology Dg Chest 2 View  Result Date: 06/12/2018 CLINICAL DATA:  Knee replacement 06/09/2018.  Syncope, dizziness EXAM: CHEST - 2 VIEW COMPARISON:  08/08/2016 FINDINGS: Aortic valve replacement. Mild cardiac enlargement without heart failure. Lungs are clear without infiltrate or effusion. IMPRESSION: No active cardiopulmonary disease. Electronically Signed   By: Franchot Gallo M.D.   On: 06/12/2018 12:23   Ct Angio Chest Pe W/cm &/or Wo Cm  Result Date: 06/12/2018 CLINICAL DATA:  Recent left total knee arthroplasty on 06/09/2018 presents with dyspnea. EXAM: CT ANGIOGRAPHY CHEST  WITH CONTRAST TECHNIQUE: Multidetector CT imaging of the chest was performed using the standard protocol during bolus administration of intravenous contrast. Multiplanar CT image reconstructions and MIPs were obtained to evaluate the vascular anatomy. CONTRAST:  141mL ISOVUE-370 IOPAMIDOL (ISOVUE-370) INJECTION 76% COMPARISON:  Same day CXR and chest radiographs dating back through 01/14/2012. FINDINGS: Cardiovascular: Cardiomegaly with aortic atherosclerosis and atherosclerosis at the origin of the great vessels. No pericardial effusion. Conventional branch pattern of the great vessels without significant stenosis. The ascending thoracic aorta measures up to 3.8 cm in caliber. Left main and three-vessel moderate coronary arteriosclerosis is noted. Median sternotomy sutures are in place with evidence of prior aortic valve replacement. No pulmonary embolus. No dilatation of the main pulmonary artery. Mediastinum/Nodes: No enlarged mediastinal, hilar, or axillary lymph nodes. Thyroid gland, trachea, and esophagus demonstrate no significant findings. Lungs/Pleura: Bibasilar dependent and subsegmental atelectasis within both lower lobes and lingula. No effusion or pneumothorax. Upper Abdomen: No acute abnormality. Musculoskeletal: Mild degenerative change of the thoracic spine without acute osseous appearing abnormality. Review of the MIP images confirms the above findings. IMPRESSION: 1. No acute pulmonary embolus. 2. Cardiomegaly with coronary arteriosclerosis. Status post aortic valve replacement without complicating features. 3. No active pulmonary disease. Aortic Atherosclerosis (ICD10-I70.0). Electronically Signed   By: Ashley Royalty M.D.   On: 06/12/2018 18:17   Ct Abdomen Pelvis W Contrast  Result Date: 06/12/2018 CLINICAL DATA:  Acute generalized abdominal pain and constipation. Recent left knee arthroplasty 06/09/2018 EXAM: CT ABDOMEN AND PELVIS WITH CONTRAST TECHNIQUE: Multidetector CT imaging of the  abdomen and pelvis was performed using the standard protocol following bolus administration of intravenous contrast. CONTRAST:  147mL ISOVUE-370 IOPAMIDOL (ISOVUE-370) INJECTION 76% as part of the chest, abdomen and pelvic CT studies. COMPARISON:  12/15/2011 radiographs, 09/26/2004 CT FINDINGS: Lower chest: The included heart size is enlarged. No pericardial effusion. Status post aortic valve replacement. Coronary arteriosclerosis. Atelectasis noted in the right lower lobe and lingula. Hepatobiliary: Colonic interposition over the liver shadow. Homogeneous attenuation of the liver without mass. No biliary dilatation with the gallbladder is normal. Pancreas: 16 mm fatty lesion in the pancreatic head compatible with a small lipoma of the pancreas, unchanged  in appearance to slightly smaller. No ductal dilatation or inflammation. Spleen: Normal size spleen without mass Adrenals/Urinary Tract: Normal bilateral adrenal glands. Simple cyst arising off the upper pole the left kidney measuring approximately 4.9 cm in diameter. No obstructive uropathy. The urinary bladder is unremarkable for the degree of distention. Stomach/Bowel: Moderate stool retention within the right colon. Gaseous distention of large bowel is otherwise noted without mechanical bowel obstruction. The stomach and small intestine are nonacute. Normal-appearing appendix. The distal and terminal ileum are within normal limits. Vascular/Lymphatic: Moderate marked aortoiliac atherosclerosis without aneurysm. Patent branch vessels off the aorta. No lymphadenopathy. Reproductive: Prostatomegaly with the prostate measuring approximately 6 x 4.9 cm x 5.2 cm. Other: No free air nor free fluid. Musculoskeletal: Levoconvex curvature of the lumbar spine with lower lumbar degenerative disc disease most pronounced at L4-5. Osteoarthritic joint space narrowing of both hips. No acute nor suspicious osseous lesions. IMPRESSION: 1. Stable to slightly smaller lipoma of the  pancreatic head now measuring 1.6 cm versus 2 cm previously. 2. Simple cyst arising off the upper pole the left kidney measuring approximately 4.9 cm in diameter. No obstructive uropathy. 3. Moderate stool retention within the right colon. No bowel obstruction or inflammation. Normal appendix. 4. Prostatomegaly with the prostate measuring 6 x 4.9 x 5.2 cm. 5. Levoconvex curvature of the lumbar spine with lower lumbar degenerative disc disease most pronounced at L4-5 with vacuum disc phenomena. 6. Cardiomegaly with aortic valvular replacement and coronary arteriosclerosis. Electronically Signed   By: Ashley Royalty M.D.   On: 06/12/2018 18:12    Procedures Procedures (including critical care time)  Medications Ordered in ED Medications  0.9 %  sodium chloride infusion (has no administration in time range)     Initial Impression / Assessment and Plan / ED Course  I have reviewed the triage vital signs and the nursing notes.  Pertinent labs & imaging results that were available during my care of the patient were reviewed by me and considered in my medical decision making (see chart for details).     Patient not orthostatic here.  Head CT negative and was performed for possible seizures.  Will admit for evaluation of syncope  Final Clinical Impressions(s) / ED Diagnoses   Final diagnoses:  None    ED Discharge Orders    None       Lacretia Leigh, MD 06/13/18 1929

## 2018-06-14 ENCOUNTER — Observation Stay (HOSPITAL_BASED_OUTPATIENT_CLINIC_OR_DEPARTMENT_OTHER): Payer: Medicare Other

## 2018-06-14 ENCOUNTER — Observation Stay (HOSPITAL_COMMUNITY): Payer: Medicare Other

## 2018-06-14 DIAGNOSIS — E871 Hypo-osmolality and hyponatremia: Secondary | ICD-10-CM | POA: Diagnosis not present

## 2018-06-14 DIAGNOSIS — I10 Essential (primary) hypertension: Secondary | ICD-10-CM | POA: Diagnosis not present

## 2018-06-14 DIAGNOSIS — D649 Anemia, unspecified: Secondary | ICD-10-CM | POA: Diagnosis not present

## 2018-06-14 DIAGNOSIS — I5030 Unspecified diastolic (congestive) heart failure: Secondary | ICD-10-CM | POA: Diagnosis not present

## 2018-06-14 DIAGNOSIS — K5903 Drug induced constipation: Secondary | ICD-10-CM | POA: Diagnosis not present

## 2018-06-14 DIAGNOSIS — R55 Syncope and collapse: Secondary | ICD-10-CM | POA: Diagnosis not present

## 2018-06-14 LAB — BASIC METABOLIC PANEL
ANION GAP: 14 (ref 5–15)
BUN: 13 mg/dL (ref 8–23)
CHLORIDE: 93 mmol/L — AB (ref 98–111)
CO2: 24 mmol/L (ref 22–32)
CREATININE: 0.77 mg/dL (ref 0.61–1.24)
Calcium: 8.9 mg/dL (ref 8.9–10.3)
GFR calc non Af Amer: >60 mL/min (ref >60)
Glucose, Bld: 111 mg/dL — ABNORMAL HIGH (ref 70–99)
POTASSIUM: 3.3 mmol/L — AB (ref 3.5–5.1)
Sodium: 131 mmol/L — ABNORMAL LOW (ref 135–145)

## 2018-06-14 LAB — ECHOCARDIOGRAM COMPLETE
Height: 67 in
Weight: 3679.04 oz

## 2018-06-14 LAB — CBC WITH DIFFERENTIAL/PLATELET
Abs Immature Granulocytes: 0.09 10*3/uL — ABNORMAL HIGH (ref 0.00–0.07)
BASOS ABS: 0 10*3/uL (ref 0.0–0.1)
Basophils Relative: 0 %
EOS PCT: 3 %
Eosinophils Absolute: 0.3 10*3/uL (ref 0.0–0.5)
HCT: 31.3 % — ABNORMAL LOW (ref 39.0–52.0)
HEMOGLOBIN: 10.7 g/dL — AB (ref 13.0–17.0)
IMMATURE GRANULOCYTES: 1 %
LYMPHS PCT: 23 %
Lymphs Abs: 2.6 10*3/uL (ref 0.7–4.0)
MCH: 30.9 pg (ref 26.0–34.0)
MCHC: 34.2 g/dL (ref 30.0–36.0)
MCV: 90.5 fL (ref 80.0–100.0)
Monocytes Absolute: 1.4 10*3/uL — ABNORMAL HIGH (ref 0.1–1.0)
Monocytes Relative: 12 %
NEUTROS ABS: 6.8 10*3/uL (ref 1.7–7.7)
NEUTROS PCT: 61 %
NRBC: 0 % (ref 0.0–0.2)
Platelets: 309 10*3/uL (ref 150–400)
RBC: 3.46 MIL/uL — AB (ref 4.22–5.81)
RDW: 12.6 % (ref 11.5–15.5)
WBC: 11.1 10*3/uL — ABNORMAL HIGH (ref 4.0–10.5)

## 2018-06-14 LAB — MAGNESIUM: MAGNESIUM: 1.9 mg/dL (ref 1.7–2.4)

## 2018-06-14 LAB — TROPONIN I

## 2018-06-14 LAB — PHOSPHORUS: PHOSPHORUS: 3.4 mg/dL (ref 2.5–4.6)

## 2018-06-14 MED ORDER — SODIUM CHLORIDE 0.9 % IV SOLN
INTRAVENOUS | Status: AC
  Administered 2018-06-14 (×2): via INTRAVENOUS

## 2018-06-14 MED ORDER — SODIUM CHLORIDE 0.9 % IV BOLUS
500.0000 mL | Freq: Once | INTRAVENOUS | Status: AC
  Administered 2018-06-14: 500 mL via INTRAVENOUS

## 2018-06-14 MED ORDER — SODIUM CHLORIDE 0.9 % IV BOLUS
1000.0000 mL | Freq: Once | INTRAVENOUS | Status: AC
  Administered 2018-06-14: 1000 mL via INTRAVENOUS

## 2018-06-14 MED ORDER — BISACODYL 10 MG RE SUPP
10.0000 mg | Freq: Once | RECTAL | Status: AC
  Administered 2018-06-14: 10 mg via RECTAL
  Filled 2018-06-14: qty 1

## 2018-06-14 MED ORDER — POTASSIUM CHLORIDE CRYS ER 20 MEQ PO TBCR
40.0000 meq | EXTENDED_RELEASE_TABLET | Freq: Two times a day (BID) | ORAL | Status: AC
  Administered 2018-06-14 (×2): 40 meq via ORAL
  Filled 2018-06-14 (×2): qty 2

## 2018-06-14 MED ORDER — POLYETHYLENE GLYCOL 3350 17 G PO PACK
17.0000 g | PACK | Freq: Two times a day (BID) | ORAL | Status: DC
  Administered 2018-06-14 – 2018-06-15 (×3): 17 g via ORAL
  Filled 2018-06-14 (×3): qty 1

## 2018-06-14 MED ORDER — SENNOSIDES-DOCUSATE SODIUM 8.6-50 MG PO TABS
1.0000 | ORAL_TABLET | Freq: Two times a day (BID) | ORAL | Status: DC
  Administered 2018-06-14 – 2018-06-15 (×3): 1 via ORAL
  Filled 2018-06-14 (×3): qty 1

## 2018-06-14 NOTE — Progress Notes (Signed)
PROGRESS NOTE    Andrew Bonilla  ALP:[id number] DOB: 1932-09-29 DOA: 06/13/2018 PCP: Wenda Low, MD  Brief Narrative:  HPI per Dr. Mitzi Hansen on 06/14/18 Andrew Bonilla is a 84 y.o. male with medical history significant for hypertension, hyperlipidemia, status post aortic valve repair, and osteoarthritis status post total knee arthroplasty on 06/09/2018, now presenting to the emergency department for evaluation of recurrent syncope.  Patient is accompanied by his family who have witnessed some of these episodes.  Patient went home following his knee surgery, had syncope upon standing back at home, was evaluated in the emergency department at that time, found to have orthostatic hypotension, and was given IV fluids in the ED with improvement.  He had also been complaining of constipation and underwent CT the abdomen and pelvis with no acute findings.  Patient has also had some syncopal episodes while straining to move his bowels that were accompanied by nausea, feeling "clammy," and lightheadedness, but no chest pain or palpitations.  He had another episode today shortly after getting up from a seated position.  Family noted that his eyes seem to deviate up into the right and there was a momentary jerking of his arm.  He regained awareness within seconds, but continued to experience some nausea and malaise for several minutes.  He denies any chest pain or palpitations with any of these episodes, denies headache, change in vision or hearing, or any focal numbness or weakness. Denies any history of seizure.  **Currently being worked up for syncope and syncopized again.  Discussed the the case with Neurology and Cardiology. Cardiology formally consult and see the patient to rule out any cardiac causes for syncope.  I spoke with Dr. Rory Percy in neurology who recommends an MRI currently.  Assessment & Plan:   Principal Problem:   Syncope Active Problems:   Essential hypertension   Constipation   S/P  AVR (aortic valve replacement)   OA (osteoarthritis) of knee   Normocytic anemia   Hyponatremia  Syncope, recurrent   - Presents after a series of syncopal episodes, some with brief convulsive activity, none with chest pain or palpitation, noted to have positive orthostatics in ED 10/10 and treated with IVF  - There were features suggestive of both orthostasis and vasovagal syncope, seizure is possible but less likely but I discussed with Neurology who recommended MRI given that no EEG Available to be done this AM -Also consulted Cardiology for further evaluation and recc's as patient synopsized again during hospitalization prior to going down to MRI -Continue cardiac monitoring with Telemetry -Continued IVF hydration and gave an additional 1.5 Liter bolus and started on Maintenance with 100 mL/hr -Checked Echocardiogram and there is no significant change since last echocardiogram and showed an EF of 60 to 65% with grade 1 diastolic dysfunction.  The aortic valve had a bioprosthetic valve -Repeat orthostatic vital signs this a.m. show the patient did drop greater than 20 points systolic from lying to standing earlier in the day but then repeat orthostatic vital signs done after syncopal episode so the patient was not orthostatic -Continue compression stockings -May need an abdominal binder -Troponins x2 is less than 0.03 -Appreciate further evaluation recommendation by cardiology and neurology -Head CT showed no acute findings and showed no intracranial mass, hemorrhage, or edema  -PT recommending home health PT  OA s/p left TKA on 06/09/18  -Appear to be healing appropriately  -Continue wound care, PT recommended Home Health PT vs OP PT  Hypertension  -BP at  goal and slightly elevated at 152/69 -Hold HCTZ while hydrating -Continue ARB with Irbesartan 75 mg po Daily as tolerated    Hyponatremia  -Mild -Serum sodium is 132 on admission and dropped to 131 this a.m. -Possibly d/t  hypovolemia and/or HCTZ or combination -Hold HCTZ and continue IVF hydration and renewed order 400 and mils per hour and gave patient an additional 1.5 L boluses today -Repeat CMP in AM     Normocytic Anemia  - Hgb is 10.4 on admission  and repeat hemoglobin/hematocrit stable at 10.7/31.3 -Check anemia panel in a.m.  -Appears to be stable and no bleeding noted   -Monitor for signs and symptoms of bleeding -Repeat CBC in a.m.  Constipation -Patient complains of constipation, straining to move bowels, no success with senna and MiraLax at home  - No obstruction on CT abd/pelvis performed in ED on 10/10 -Started bisacodyl suppository 10 mg and give it once and patient also had magnesium citrate 1 bottle p.o. yesterday -Continue with magnesium hydroxide 30 mL p.o. daily PRN for mild constipation, started MiraLAX twice daily, and continue with sorbitol 70% solution 30 mils daily PRN for moderate constipation -Also started scheduled Senna-Docusate 1 tab po BID  Hypokalemia -Patient's potassium is 3.3 -Replete with p.o. potassium chloride 40 mg p.o. twice daily -Continue monitor and replete as necessary -Repeat CMP in a.m.  Leukocytosis -Mild and went from 12.2 -> 11.1 -Continue to monitor for signs and symptoms of infection -Repeat CBC in a.m.  Obesity -Estimated body mass index is 36.01 kg/m as calculated from the following:   Height as of this encounter: 5\' 7"  (1.702 m).   Weight as of this encounter: 104.3 kg. -Weight Loss Counseling Given   DVT prophylaxis: Anticoagulated with Xarelto Code Status: FULL CODE Family Communication: Discussed with Daughter at bedside Disposition Plan:   Consultants:   Cardiology  Discussed the case with Neurology Dr. Rory Percy   Procedures:  ECHOCARDIOGRAM ------------------------------------------------------------------- Study Conclusions  - Left ventricle: The cavity size was normal. Wall thickness was   increased in a pattern of  moderate LVH. Systolic function was   normal. The estimated ejection fraction was in the range of 60%   to 65%. Wall motion was normal; there were no regional wall   motion abnormalities. Doppler parameters are consistent with   abnormal left ventricular relaxation (grade 1 diastolic   dysfunction). The E/e&' ratio is between 8-15, suggesting   indeterminate LV filling pressure. - Aortic valve: Bioprosthetic valve (21 mm Edwards Magna-Ease). No   obstruction. Mean gradient (S): 10 mm Hg. Peak gradient (S): 19   mm Hg. Valve area (VTI): 1.7 cm^2. Valve area (Vmax): 1.75 cm^2.   Valve area (Vmean): 1.69 cm^2. - Mitral valve: Calcified annulus. Mildly thickened leaflets .   There was trivial regurgitation. - Left atrium: The atrium was normal in size. - Right atrium: The atrium was mildly dilated. - Tricuspid valve: There was trivial regurgitation. - Pulmonary arteries: PA peak pressure: 26 mm Hg (S). - Inferior vena cava: The vessel was normal in size. The   respirophasic diameter changes were in the normal range (>= 50%),   consistent with normal central venous pressure.  Impressions:  - Compared to a prior study in 07/2017, there have been no   significant changes.  Antimicrobials:  Anti-infectives (From admission, onward)   None     Subjective: Examined at bedside states that he did not have a bowel movement in 6 days.  States he is feeling better today and  no chest pain, lightheadedness or dizziness.  However he syncopized again on his way to the MRI and nurse almost call a rapid response earlier.  No chest pain, lightheadedness or dizziness.  No nausea or vomiting.  No other concerns or complaints at this time.  Objective: Vitals:   06/13/18 1900 06/13/18 2000 06/14/18 0503 06/14/18 1326  BP:   (!) 158/72 (!) 152/69  Pulse: 79 77 77 83  Resp: (!) 25 (!) 21 18 (!) 22  Temp:   98.9 F (37.2 C) 98.5 F (36.9 C)  TempSrc:   Oral Oral  SpO2: 99% 99% 95% 98%  Weight:    104.3 kg   Height:   5\' 7"  (1.702 m)     Intake/Output Summary (Last 24 hours) at 06/14/2018 1618 Last data filed at 06/14/2018 1000 Gross per 24 hour  Intake 1440 ml  Output -  Net 1440 ml   Filed Weights   06/13/18 1605 06/14/18 0503  Weight: 102 kg 104.3 kg   Examination: Physical Exam:  Constitutional: WN/WD obese male in NAD and appears calm and comfortable sitting in chair bedside Eyes: Lids and conjunctivae normal, sclerae anicteric  ENMT: External Ears, Nose appear normal. Grossly normal hearing. Mucous membranes are moist.  Neck: Appears normal, supple, no cervical masses, normal ROM, no appreciable thyromegaly; no JVD Respiratory: Diminished to auscultation bilaterally, no wheezing, rales, rhonchi or crackles. Normal respiratory effort and patient is not tachypenic. No accessory muscle use.  Cardiovascular: RRR, Has a murmur. No LE Edema noted Abdomen: Soft, non-tender, Distended 2/2 body habitus. No masses palpated. No appreciable hepatosplenomegaly. Bowel sounds positive.  GU: Deferred. Musculoskeletal: No clubbing / cyanosis of digits/nails. No joint deformity upper and lower extremities.  Skin: No rashes, lesions, ulcers. Knee incision appeared C/D/I. No induration; Warm and dry.  Neurologic: CN 2-12 grossly intact with no focal deficits. Romberg sign and cerebellar reflexes not assessed.  Psychiatric: Normal judgment and insight. Alert and oriented x 3. Normal mood and appropriate affect.   Data Reviewed: I have personally reviewed following labs and imaging studies  CBC: Recent Labs  Lab 06/11/18 0451 06/12/18 1239 06/12/18 1420 06/13/18 1831 06/14/18 0805  WBC 17.3* SPECIMEN CONTAMINATED, UNABLE TO PERFORM TEST(S). 11.3* 12.2* 11.1*  NEUTROABS  --  SPECIMEN CONTAMINATED, UNABLE TO PERFORM TEST(S). 7.2  --  6.8  HGB 10.8* SPECIMEN CONTAMINATED, UNABLE TO PERFORM TEST(S). 10.8* 10.4* 10.7*  HCT 31.7* SPECIMEN CONTAMINATED, UNABLE TO PERFORM TEST(S). 31.8*  30.8* 31.3*  MCV 90.3 SPECIMEN CONTAMINATED, UNABLE TO PERFORM TEST(S). 91.1 91.1 90.5  PLT 278 SPECIMEN CONTAMINATED, UNABLE TO PERFORM TEST(S). 272 243 656   Basic Metabolic Panel: Recent Labs  Lab 06/10/18 0517 06/11/18 0451 06/12/18 1420 06/13/18 1831 06/14/18 0801 06/14/18 0805  NA 135 137 133* 132*  --  131*  K 3.4* 4.0 4.0 3.7  --  3.3*  CL 98 96* 94* 92*  --  93*  CO2 26 27 27 28   --  24  GLUCOSE 128* 120* 115* 124*  --  111*  BUN 11 14 13 17   --  13  CREATININE 0.86 0.84 0.91 0.83  --  0.77  CALCIUM 9.0 9.5 9.0 9.2  --  8.9  MG  --   --   --   --  1.9  --   PHOS  --   --   --   --  3.4  --    GFR: Estimated Creatinine Clearance: 80.6 mL/min (by C-G formula based on SCr of  0.77 mg/dL). Liver Function Tests: Recent Labs  Lab 06/12/18 1420  AST 34  ALT 27  ALKPHOS 47  BILITOT 1.1  PROT 6.5  ALBUMIN 3.5   No results for input(s): LIPASE, AMYLASE in the last 168 hours. No results for input(s): AMMONIA in the last 168 hours. Coagulation Profile: Recent Labs  Lab 06/12/18 1239 06/12/18 1420  INR SPECIMEN CONTAMINATED, UNABLE TO PERFORM TEST(S). 1.34   Cardiac Enzymes: Recent Labs  Lab 06/12/18 1420 06/13/18 1831 06/14/18 0801  TROPONINI <0.03 <0.03 <0.03   BNP (last 3 results) No results for input(s): PROBNP in the last 8760 hours. HbA1C: No results for input(s): HGBA1C in the last 72 hours. CBG: Recent Labs  Lab 06/13/18 1749  GLUCAP 99   Lipid Profile: No results for input(s): CHOL, HDL, LDLCALC, TRIG, CHOLHDL, LDLDIRECT in the last 72 hours. Thyroid Function Tests: No results for input(s): TSH, T4TOTAL, FREET4, T3FREE, THYROIDAB in the last 72 hours. Anemia Panel: No results for input(s): VITAMINB12, FOLATE, FERRITIN, TIBC, IRON, RETICCTPCT in the last 72 hours. Sepsis Labs: Recent Labs  Lab 06/12/18 1300 06/12/18 1533  LATICACIDVEN 1.37 0.87    No results found for this or any previous visit (from the past 240 hour(s)).   Radiology  Studies: Ct Head Wo Contrast  Result Date: 06/13/2018 CLINICAL DATA:  Altered level of consciousness. Recent knee replacement. EXAM: CT HEAD WITHOUT CONTRAST TECHNIQUE: Contiguous axial images were obtained from the base of the skull through the vertex without intravenous contrast. COMPARISON:  Head CT dated 12/15/2011. FINDINGS: Brain: Ventricles are stable in size and configuration. Mild chronic small vessel ischemic change noted within the deep periventricular white matter regions bilaterally. There is no mass, hemorrhage, edema or other evidence of acute parenchymal abnormality. No extra-axial hemorrhage. Vascular: Chronic calcified atherosclerotic changes of the large vessels at the skull base. No unexpected hyperdense vessel. Skull: Normal. Negative for fracture or focal lesion. Sinuses/Orbits: No acute finding. Other: None. IMPRESSION: No acute findings.  No intracranial mass, hemorrhage or edema. Electronically Signed   By: Franki Cabot M.D.   On: 06/13/2018 17:33   Ct Angio Chest Pe W/cm &/or Wo Cm  Result Date: 06/12/2018 CLINICAL DATA:  Recent left total knee arthroplasty on 06/09/2018 presents with dyspnea. EXAM: CT ANGIOGRAPHY CHEST WITH CONTRAST TECHNIQUE: Multidetector CT imaging of the chest was performed using the standard protocol during bolus administration of intravenous contrast. Multiplanar CT image reconstructions and MIPs were obtained to evaluate the vascular anatomy. CONTRAST:  159mL ISOVUE-370 IOPAMIDOL (ISOVUE-370) INJECTION 76% COMPARISON:  Same day CXR and chest radiographs dating back through 01/14/2012. FINDINGS: Cardiovascular: Cardiomegaly with aortic atherosclerosis and atherosclerosis at the origin of the great vessels. No pericardial effusion. Conventional branch pattern of the great vessels without significant stenosis. The ascending thoracic aorta measures up to 3.8 cm in caliber. Left main and three-vessel moderate coronary arteriosclerosis is noted. Median  sternotomy sutures are in place with evidence of prior aortic valve replacement. No pulmonary embolus. No dilatation of the main pulmonary artery. Mediastinum/Nodes: No enlarged mediastinal, hilar, or axillary lymph nodes. Thyroid gland, trachea, and esophagus demonstrate no significant findings. Lungs/Pleura: Bibasilar dependent and subsegmental atelectasis within both lower lobes and lingula. No effusion or pneumothorax. Upper Abdomen: No acute abnormality. Musculoskeletal: Mild degenerative change of the thoracic spine without acute osseous appearing abnormality. Review of the MIP images confirms the above findings. IMPRESSION: 1. No acute pulmonary embolus. 2. Cardiomegaly with coronary arteriosclerosis. Status post aortic valve replacement without complicating features. 3. No active pulmonary  disease. Aortic Atherosclerosis (ICD10-I70.0). Electronically Signed   By: Ashley Royalty M.D.   On: 06/12/2018 18:17   Ct Abdomen Pelvis W Contrast  Result Date: 06/12/2018 CLINICAL DATA:  Acute generalized abdominal pain and constipation. Recent left knee arthroplasty 06/09/2018 EXAM: CT ABDOMEN AND PELVIS WITH CONTRAST TECHNIQUE: Multidetector CT imaging of the abdomen and pelvis was performed using the standard protocol following bolus administration of intravenous contrast. CONTRAST:  1102mL ISOVUE-370 IOPAMIDOL (ISOVUE-370) INJECTION 76% as part of the chest, abdomen and pelvic CT studies. COMPARISON:  12/15/2011 radiographs, 09/26/2004 CT FINDINGS: Lower chest: The included heart size is enlarged. No pericardial effusion. Status post aortic valve replacement. Coronary arteriosclerosis. Atelectasis noted in the right lower lobe and lingula. Hepatobiliary: Colonic interposition over the liver shadow. Homogeneous attenuation of the liver without mass. No biliary dilatation with the gallbladder is normal. Pancreas: 16 mm fatty lesion in the pancreatic head compatible with a small lipoma of the pancreas, unchanged in  appearance to slightly smaller. No ductal dilatation or inflammation. Spleen: Normal size spleen without mass Adrenals/Urinary Tract: Normal bilateral adrenal glands. Simple cyst arising off the upper pole the left kidney measuring approximately 4.9 cm in diameter. No obstructive uropathy. The urinary bladder is unremarkable for the degree of distention. Stomach/Bowel: Moderate stool retention within the right colon. Gaseous distention of large bowel is otherwise noted without mechanical bowel obstruction. The stomach and small intestine are nonacute. Normal-appearing appendix. The distal and terminal ileum are within normal limits. Vascular/Lymphatic: Moderate marked aortoiliac atherosclerosis without aneurysm. Patent branch vessels off the aorta. No lymphadenopathy. Reproductive: Prostatomegaly with the prostate measuring approximately 6 x 4.9 cm x 5.2 cm. Other: No free air nor free fluid. Musculoskeletal: Levoconvex curvature of the lumbar spine with lower lumbar degenerative disc disease most pronounced at L4-5. Osteoarthritic joint space narrowing of both hips. No acute nor suspicious osseous lesions. IMPRESSION: 1. Stable to slightly smaller lipoma of the pancreatic head now measuring 1.6 cm versus 2 cm previously. 2. Simple cyst arising off the upper pole the left kidney measuring approximately 4.9 cm in diameter. No obstructive uropathy. 3. Moderate stool retention within the right colon. No bowel obstruction or inflammation. Normal appendix. 4. Prostatomegaly with the prostate measuring 6 x 4.9 x 5.2 cm. 5. Levoconvex curvature of the lumbar spine with lower lumbar degenerative disc disease most pronounced at L4-5 with vacuum disc phenomena. 6. Cardiomegaly with aortic valvular replacement and coronary arteriosclerosis. Electronically Signed   By: Ashley Royalty M.D.   On: 06/12/2018 18:12   Scheduled Meds: . atorvastatin  40 mg Oral q1800  . irbesartan  75 mg Oral Daily  . multivitamin with minerals  1  tablet Oral Daily  . pantoprazole  40 mg Oral BID AC  . polyethylene glycol  17 g Oral BID  . potassium chloride  40 mEq Oral BID  . rivaroxaban  10 mg Oral Q breakfast  . senna-docusate  1 tablet Oral BID  . sodium chloride flush  3 mL Intravenous Q12H   Continuous Infusions: . sodium chloride    . sodium chloride    . sodium chloride 500 mL (06/14/18 1520)     LOS: 0 days   Kerney Elbe, DO [facility] PAGER is on [place]  If 7PM-7AM, please contact night-coverage [email or web address] Password [email or web address] 06/14/2018, 4:18 PM

## 2018-06-14 NOTE — Progress Notes (Signed)
Pt up to  walk to BR w/ walker and assist. hads lg BM. Back to EOB and stood to place gown on back to sit in wc to go to MRI. Became diaphoretic and pale. Had syncopal episode and laid back on bed . vss. see flowsheet. Family at bs.Discussed w/ Dr Alfredia Ferguson happenings anD new orders received. Pt states I feel "fine" color to face has slowly returned now that he is back to bed.CN in room to assist. Will monitor closely.

## 2018-06-14 NOTE — Progress Notes (Addendum)
Physical Therapy Treatment Patient Details Name: Andrew Bonilla MRN: [id number] DOB: 1933/03/29 Today's Date: 06/14/2018    History of Present Illness pt with LTKA on 06/09/18 returned home doing fairly well, then began to have syncope episodes, dizziness, flush when up and moving around. Went to the ED 2x due to these episodes and now admit to the unit for follow up.     PT Comments    2nd session this morning to work on ambulation and perform ortho statics. See flow sheet as well for entries. Pt did start supine at 153/73, then seated to 145/68 then once standing and walking performed several times and remained around 130/72. Pt did have a drop from supine to standing activities, however it did maintain steady with his standing BP numbers and no reports of symptoms during this upright session. He ambulated int eh hallway with RW , closely followed by recline in case he got dizzy, and he had a bathroom visit. PT still reports no BM since prior to surgery on 06/09/2018.  Will continue to follow while here.    Follow Up Recommendations  Home health PT versus MD plan. (original plan was to OP, so depends on his progress and what the ortho MD suggests as well)      Equipment Recommendations       Recommendations for Other Services       Precautions / Restrictions Precautions Precautions: Knee Restrictions Weight Bearing Restrictions: No    Mobility  Bed Mobility Overal bed mobility: Needs Assistance Bed Mobility: Sit to Supine     Supine to sit: Min assist Sit to supine: Min assist   General bed mobility comments: with LLE ligting it into the bed. He usually uses his leg lifter to assist himself.   Transfers Overall transfer level: Needs assistance Equipment used: Rolling walker (2 wheeled) Transfers: Sit to/from Stand Sit to Stand: Min guard         General transfer comment: pt wanted to do it himself. Just has to do some rocking prior to lift off and slow to rise, but  was able to without me assisting.   Ambulation/Gait Ambulation/Gait assistance: Min guard(jsut close by iwth hand on belt and followed with teh recliner due to admit for walking syncope episodes. ) Gait Distance (Feet): 90 Feet Assistive device: Rolling walker (2 wheeled) Gait Pattern/deviations: Step-through pattern     General Gait Details: smaller seps but steady, and step through pattern. L knee still very stiff    Stairs             Wheelchair Mobility    Modified Rankin (Stroke Patients Only)       Balance Overall balance assessment: No apparent balance deficits (not formally assessed)                                          Cognition Arousal/Alertness: Awake/alert Behavior During Therapy: WFL for tasks assessed/performed Overall Cognitive Status: Within Functional Limits for tasks assessed                                        Exercises    General Comments        Pertinent Vitals/Pain Faces Pain Scale: Hurts a little bit Pain Location: Left knee  Pain Descriptors / Indicators: Aching Pain  Intervention(s): Monitored during session;Ice applied    Home Living Family/patient expects to be discharged to:: Private residence Living Arrangements: Spouse/significant other Available Help at Discharge: Family Type of Home: House Home Access: Stairs to enter Entrance Stairs-Rails: None Home Layout: Multi-level;Able to live on main level with bedroom/bathroom Home Equipment: Walker - 4 wheels;Walker - 2 wheels Additional Comments: 3 wheeled     Prior Function Level of Independence: Independent      Comments: priro to his knee surgery , pt very independent and active. Travels a lot.    PT Goals (current goals can now be found in the care plan section) Acute Rehab PT Goals Patient Stated Goal: to get back to feeling better and back to travelling  PT Goal Formulation: With patient Time For Goal Achievement:  06/28/18 Potential to Achieve Goals: Good Progress towards PT goals: Progressing toward goals    Frequency    Min 5X/week      PT Plan Current plan remains appropriate    Co-evaluation              AM-PAC PT "6 Clicks" Daily Activity  Outcome Measure  Difficulty turning over in bed (including adjusting bedclothes, sheets and blankets)?: A Little Difficulty moving from lying on back to sitting on the side of the bed? : A Little Difficulty sitting down on and standing up from a chair with arms (e.g., wheelchair, bedside commode, etc,.)?: A Little Help needed moving to and from a bed to chair (including a wheelchair)?: A Little Help needed walking in hospital room?: A Little Help needed climbing 3-5 steps with a railing? : A Lot 6 Click Score: 17    End of Session Equipment Utilized During Treatment: Gait belt Activity Tolerance: Patient tolerated treatment well Patient left: in chair;with call bell/phone within reach;with family/visitor present Nurse Communication: Mobility status PT Visit Diagnosis: Other abnormalities of gait and mobility (R26.89) Pain - Right/Left: Left Pain - part of body: Knee     Time: 5732-2025 PT Time Calculation (min) (ACUTE ONLY): 30 min  Charges:  $Gait Training: 8-22 mins $Therapeutic Exercise: 8-22 mins $Therapeutic Activity: 8-22 mins                     Clide Dales, PT Acute Rehabilitation Services Pager: [phone] Office: [phone] 06/14/2018    Clide Dales 06/14/2018, 1:13 PM

## 2018-06-14 NOTE — Progress Notes (Signed)
  Echocardiogram 2D Echocardiogram has been performed.  [name] 06/14/2018, 12:40 PM

## 2018-06-14 NOTE — Evaluation (Signed)
Physical Therapy Evaluation Patient Details Name: Andrew Bonilla MRN: [id number] DOB: November 19, 1932 Today's Date: 06/14/2018   History of Present Illness  pt with LTKA on 06/09/18 returned home doing fairly well, then began to have syncope episodes, dizziness, flush when up and moving around. Went to the ED 2x due to these episodes and now admit to the unit for follow up.   Clinical Impression  Pt assessed and noted to be stiff in L knee ROM. Worked with supine and sitting exercises this morning, as well at seated EOB exercises before transferring to the recliner. Will return for walk after pt eats breakfast.May need HHPT after this hospital stay to then transition to [facility] depending on what the MD prefers. Daughter stated she would pay for CIR or extra HHPT visits in order to get him as strong as possible. CM please address this with the daughter.      Follow Up Recommendations Home health PT(may need HHPT for a 1-2 then transition to OP. Family would like to pay for the most visits if possible to help pt progress)    Equipment Recommendations       Recommendations for Other Services       Precautions / Restrictions Precautions Precautions: Knee Restrictions Weight Bearing Restrictions: No      Mobility  Bed Mobility Overal bed mobility: Needs Assistance Bed Mobility: Sit to Supine     Supine to sit: Min assist Sit to supine: Min assist      Transfers Overall transfer level: Needs assistance Equipment used: Rolling walker (2 wheeled) Transfers: Sit to/from Stand Sit to Stand: Min guard         General transfer comment: pt wanted to do it himself. Just has to do some rocking prior to lift off and slow to rise, but was able to without me assisting.     Stairs            Wheelchair Mobility    Modified Rankin (Stroke Patients Only)       Balance Overall balance assessment: No apparent balance deficits (not formally assessed)                                            Pertinent Vitals/Pain Faces Pain Scale: Hurts a little bit Pain Location: Left knee  Pain Descriptors / Indicators: Aching Pain Intervention(s): Monitored during session;Ice applied    Home Living Family/patient expects to be discharged to:: Private residence Living Arrangements: Spouse/significant other Available Help at Discharge: Family Type of Home: House Home Access: Stairs to enter Entrance Stairs-Rails: None Entrance Stairs-Number of Steps: 2 Home Layout: Multi-level;Able to live on main level with bedroom/bathroom Home Equipment: Walker - 4 wheels;Walker - 2 wheels Additional Comments: 3 wheeled     Prior Function Level of Independence: Independent         Comments: priro to his knee surgery , pt very independent and active. Travels a lot.      Hand Dominance   Dominant Hand: Right    Extremity/Trunk Assessment        Lower Extremity Assessment Lower Extremity Assessment: Generalized weakness LLE Deficits / Details: grossly 10-60 degree flexion on L knee . Discussed importance of moving, bending and straightening the knee with exercises and positioning.     Cervical / Trunk Assessment Cervical / Trunk Assessment: Normal  Communication   Communication: HOH(hears better on Right  ear)  Cognition Arousal/Alertness: Awake/alert Behavior During Therapy: WFL for tasks assessed/performed Overall Cognitive Status: Within Functional Limits for tasks assessed                                        General Comments      Exercises Total Joint Exercises Ankle Circles/Pumps: AROM;Both;10 reps;Supine Quad Sets: AROM;Both;10 reps;Supine Heel Slides: AAROM;Supine;Left;10 reps Hip ABduction/ADduction: AAROM;Supine;Left;10 reps Straight Leg Raises: AAROM;Supine;Left;10 reps Knee Flexion: AAROM;Seated;Left;10 reps Goniometric ROM: 10-60   Assessment/Plan    PT Assessment Patient needs continued PT services  PT Problem  List Decreased strength;Decreased range of motion;Decreased activity tolerance;Decreased mobility;Decreased knowledge of precautions;Decreased safety awareness       PT Treatment Interventions DME instruction;Gait training;Functional mobility training;Therapeutic activities;Therapeutic exercise;Patient/family education    PT Goals (Current goals can be found in the Care Plan section)  Acute Rehab PT Goals Patient Stated Goal: to get back to feeling better and back to travelling  PT Goal Formulation: With patient Time For Goal Achievement: 06/28/18 Potential to Achieve Goals: Good    Frequency Min 5X/week(would benefit from daily if possible )   Barriers to discharge        Co-evaluation               AM-PAC PT "6 Clicks" Daily Activity  Outcome Measure Difficulty turning over in bed (including adjusting bedclothes, sheets and blankets)?: A Little Difficulty moving from lying on back to sitting on the side of the bed? : A Little Difficulty sitting down on and standing up from a chair with arms (e.g., wheelchair, bedside commode, etc,.)?: A Little Help needed moving to and from a bed to chair (including a wheelchair)?: A Little Help needed walking in hospital room?: A Little Help needed climbing 3-5 steps with a railing? : A Lot 6 Click Score: 17    End of Session Equipment Utilized During Treatment: Gait belt Activity Tolerance: Patient tolerated treatment well Patient left: in chair;with call bell/phone within reach;with family/visitor present Nurse Communication: Mobility status PT Visit Diagnosis: Other abnormalities of gait and mobility (R26.89)    Time: 2725-3664 PT Time Calculation (min) (ACUTE ONLY): 45 min   Charges:   PT Evaluation $PT Eval Low Complexity: 1 Low PT Treatments $Therapeutic Exercise: 8-22 mins $Therapeutic Activity: 8-22 mins        Clide Dales, PT Acute Rehabilitation Services Pager: [phone] Office:  [phone] 06/14/2018   Clide Dales 06/14/2018, 1:06 PM

## 2018-06-14 NOTE — Progress Notes (Signed)
Patient is refusing lab draws and requesting labs pull blood from existing IV line, Pt has been educated, and lab is going to re-approach patient about labs later in the day.

## 2018-06-15 ENCOUNTER — Observation Stay (HOSPITAL_COMMUNITY): Payer: Medicare Other

## 2018-06-15 DIAGNOSIS — R55 Syncope and collapse: Secondary | ICD-10-CM | POA: Diagnosis not present

## 2018-06-15 DIAGNOSIS — K5903 Drug induced constipation: Secondary | ICD-10-CM | POA: Diagnosis not present

## 2018-06-15 DIAGNOSIS — E871 Hypo-osmolality and hyponatremia: Secondary | ICD-10-CM | POA: Diagnosis not present

## 2018-06-15 DIAGNOSIS — I10 Essential (primary) hypertension: Secondary | ICD-10-CM | POA: Diagnosis not present

## 2018-06-15 LAB — COMPREHENSIVE METABOLIC PANEL
ALBUMIN: 3 g/dL — AB (ref 3.5–5.0)
ALT: 47 U/L — ABNORMAL HIGH (ref 0–44)
AST: 40 U/L (ref 15–41)
Alkaline Phosphatase: 53 U/L (ref 38–126)
Anion gap: 7 (ref 5–15)
BUN: 16 mg/dL (ref 8–23)
CHLORIDE: 101 mmol/L (ref 98–111)
CO2: 25 mmol/L (ref 22–32)
Calcium: 8.2 mg/dL — ABNORMAL LOW (ref 8.9–10.3)
Creatinine, Ser: 0.68 mg/dL (ref 0.61–1.24)
GFR calc Af Amer: >60 mL/min (ref >60)
GFR calc non Af Amer: >60 mL/min (ref >60)
GLUCOSE: 104 mg/dL — AB (ref 70–99)
Potassium: 4.4 mmol/L (ref 3.5–5.1)
SODIUM: 133 mmol/L — AB (ref 135–145)
Total Bilirubin: 1.7 mg/dL — ABNORMAL HIGH (ref 0.3–1.2)
Total Protein: 6 g/dL — ABNORMAL LOW (ref 6.5–8.1)

## 2018-06-15 LAB — CBC WITH DIFFERENTIAL/PLATELET
Abs Immature Granulocytes: 0.07 10*3/uL (ref 0.00–0.07)
BASOS ABS: 0.1 10*3/uL (ref 0.0–0.1)
BASOS PCT: 1 %
EOS ABS: 0.4 10*3/uL (ref 0.0–0.5)
Eosinophils Relative: 3 %
HCT: 28.5 % — ABNORMAL LOW (ref 39.0–52.0)
Hemoglobin: 9.7 g/dL — ABNORMAL LOW (ref 13.0–17.0)
Immature Granulocytes: 1 %
Lymphocytes Relative: 28 %
Lymphs Abs: 3.1 10*3/uL (ref 0.7–4.0)
MCH: 30.7 pg (ref 26.0–34.0)
MCHC: 34 g/dL (ref 30.0–36.0)
MCV: 90.2 fL (ref 80.0–100.0)
Monocytes Absolute: 1.7 10*3/uL — ABNORMAL HIGH (ref 0.1–1.0)
Monocytes Relative: 15 %
NRBC: 0 % (ref 0.0–0.2)
Neutro Abs: 6 10*3/uL (ref 1.7–7.7)
Neutrophils Relative %: 52 %
PLATELETS: 311 10*3/uL (ref 150–400)
RBC: 3.16 MIL/uL — ABNORMAL LOW (ref 4.22–5.81)
RDW: 12.4 % (ref 11.5–15.5)
WBC: 11.3 10*3/uL — ABNORMAL HIGH (ref 4.0–10.5)

## 2018-06-15 LAB — GLUCOSE, CAPILLARY: Glucose-Capillary: 101 mg/dL — ABNORMAL HIGH (ref 70–99)

## 2018-06-15 LAB — MAGNESIUM: Magnesium: 2.2 mg/dL (ref 1.7–2.4)

## 2018-06-15 LAB — PHOSPHORUS: Phosphorus: 2.1 mg/dL — ABNORMAL LOW (ref 2.5–4.6)

## 2018-06-15 MED ORDER — POLYETHYLENE GLYCOL 3350 17 G PO PACK: 17.0000 g | PACK | Freq: Two times a day (BID) | ORAL | 0 refills | Status: AC

## 2018-06-15 MED ORDER — SENNOSIDES-DOCUSATE SODIUM 8.6-50 MG PO TABS: 1.0000 | ORAL_TABLET | Freq: Every day | ORAL | 0 refills | Status: DC

## 2018-06-15 MED ORDER — SODIUM CHLORIDE 0.9 % IV SOLN
INTRAVENOUS | Status: DC
  Administered 2018-06-15: 14:00:00 via INTRAVENOUS

## 2018-06-15 MED ORDER — SODIUM PHOSPHATES 45 MMOLE/15ML IV SOLN
20.0000 mmol | Freq: Once | INTRAVENOUS | Status: AC
  Administered 2018-06-15: 20 mmol via INTRAVENOUS
  Filled 2018-06-15: qty 6.67

## 2018-06-15 NOTE — Discharge Summary (Signed)
Physician Discharge Summary  Andrew Bonilla WNI:[id number] DOB: April 11, 1933 DOA: 06/13/2018  PCP: Andrew Low, MD  Admit date: 06/13/2018 Discharge date: 06/15/2018  Admitted From: Home Disposition:  Home with [facility] PT/TO/Aide/SW  Recommendations for Outpatient Follow-up:  1. Follow up with PCP in 1-2 weeks 2. Follow up with Cardiology as an outpatient 3. Have PCP refer for Outpatient EEG and follow up with Neurology if deemed necessary 4. Follow up with Orthopedic Surgery as an outpatient  5. Please obtain CMP/CBC, Mag, Phos in one week 6. Please follow up on the following pending results:  Home Health: YES Equipment/Devices: Web designer; 3in1 Bedside Commode   Discharge Condition: Stable  CODE STATUS: FULL CODE Diet recommendation: Heart Healthy   Brief/Interim Summary: HPI per Andrew Bonilla on 06/14/18 Andrew Bonilla a 85 y.o.malewith medical history significant forhypertension, hyperlipidemia, status post aortic valve repair, and osteoarthritis status post total knee arthroplasty on 06/09/2018, now presenting to the emergency department for evaluation of recurrent syncope. Patient is accompanied by his family who have witnessed some of these episodes. Patient went home following his knee surgery, had syncope upon standing back at home, was evaluated in the emergency department at that time, found to have orthostatic hypotension, and was given IV fluids in the ED with improvement. He had also been complaining of constipation and underwent CT the abdomen and pelvis with no acute findings.Patient has also had some syncopal episodes while straining to move his bowels that were accompanied by nausea, feeling "clammy," and lightheadedness, but no chest pain or palpitations. He had another episode today shortly after getting up from a seated position. Family noted that his eyes seem to deviate up into the right and there was a momentary jerking of his arm. He  regained awareness within seconds, but continued to experience some nausea and malaise for several minutes. He denies any chest pain or palpitations with any of these episodes, denies headache, change in vision or hearing, or any focal numbness or weakness. Denies any history of seizure.  **Currently was worked up for syncope and syncopized again yesterday.  Discussed the the case with Neurology and Cardiology. Cardiology formally consulted evaluated and felt that the that this represents a potentially left and cardiac cause of syncope was very remote given that he had normal LV function and normal EKG.  Cardiology felt that the patient syncopized secondary to orthostasis.  I also discussed the case with neurology Dr. Rory Bonilla who also feels like the main cause of this syncope is orthostasis and hypoperfusion.  Dr. Rory Bonilla felt that the patient's eye deviation and hand shaking episode was related to hypoperfusion to the brain in the setting of orthostasis.  He recommends an outpatient EEG if deemed necessary.  Repeat orthostatic vital signs were improved and patient did not drop.  He was given an abdominal binder and PT reevaluated and he did well.  He is deemed medically stable to be discharged home with home PT and he will need to follow-up with cardiology, PCP, orthopedic surgery and neurology in outpatient setting.  Discharge Diagnoses:  Principal Problem:   Syncope Active Problems:   Essential hypertension   Constipation   S/P AVR (aortic valve replacement)   OA (osteoarthritis) of knee   Normocytic anemia   Hyponatremia  Syncope, recurrent -Presentedafter a series of syncopal episodes, some with brief ? convulsive activity, none with chest pain or palpitation, noted to have positive orthostatics in ED 10/10 and treated with IVF -There were features suggestive of both  orthostasis and vasovagal syncope, seizure is possible but less likelybut I discussed with Neurology who recommended MRI  given that no EEG Available to be done this AM; Neuro felt as if the shaking episodes were related to Hypoperfustion -Also consulted Cardiology for further evaluation and recc's as patient synopsized again during hospitalization prior to going down to MRI -Continued cardiac monitoring with Telemetry -Continued IVF hydration and gave an additional 1.5 Liter bolus and started on Maintenance with 100 mL/hr and was given an additional 12 hours today  -Checked Echocardiogram and there is no significant change since last echocardiogram and showed an EF of 60 to 65% with grade 1 diastolic dysfunction.  The aortic valve had a bioprosthetic valve -Repeat orthostatic vital signs today were normal -Ordered abdominal binder -Troponins x2 is less than 0.03 -Appreciate further evaluation recommendation by cardiology and neurology; cardiology felt that the likelihood that this represented a potentially life-threatening cardiac cause of syncope was extremely remote given his normal LV function and normal EKG -Head CT showed no acute findings and showed no intracranial mass, hemorrhage, or edema  -PT recommending home health PT -Cardiology and neurology both felt this was orthostasis.  Patient was rehydrated adequately and PT reevaluated and patient did not syncopized and felt better.  Is deemed medically stable to be discharged home and will need to follow-up with cardiology, neurology and PCP in outpatient setting -We will stop hydrochlorthiazide altogether at discharge given his Syncope -Follow up with Neurology as an outpatient if deemed necessary and have PCP refer for an EEG   OA s/p left TKA on 06/09/18 -Appear to be healing appropriately -Continue wound care, PTrecommended Home Health PT vs OP PT -We will discharge home with home health PT/OT/Aide/Social Work -Follow Up with Andrew Bonilla in outpatient setting  Hypertension -BP at goaland slightly elevated at 131/56 -Hold HCTZ while hydrating and  will discontinue altogether discharge -Continue ARB with Andrew Bonilla 75 mg po Daily as tolerated  Hyponatremia -Mild -Serum sodium is 132 on admissionand dropped to 131 but repeat today was 133 -Possibly d/t hypovolemia and/or HCTZ or combination -Hold HCTZ and continued IVF Hydration -Replete with Sodium Phos IV 20 mmol -Repeat CMP as an outpatient  Normocytic Anemia -Hgb is 10.4 on admission and repeat hemoglobin/hematocrit was 9.7/28.5 and likely dilutional drop -Checked anemia panel as an outpatient  -Appears to be stable and no bleeding noted -Monitor for signs and symptoms of bleeding -Repeat CBC as an outpatient  Constipation, improved  -Patient complains of constipation, straining to move bowels, no success with senna and MiraLax at home  - No obstruction on CT abd/pelvis performed in ED on 10/10 -Started bisacodyl suppository 10 mg and give it once and patient also had magnesium citrate 1 bottle p.o. yesterday -Continue with magnesium hydroxide 30 mL p.o. daily PRN for mild constipation, started MiraLAX twice daily, and continue with sorbitol 70% solution 30 mils daily PRN for moderate constipation -Also started scheduled Senna-Docusate 1 tab po BID; sent home on twice daily MiraLAX and senna docusate nightly  Hypokalemia -Patient's potassium was 3.3 and improved to 4.4 -Continue monitor and replete as necessary -Repeat CMP as an outpatient  Leukocytosis -Mild and went from 12.2 -> 11.1 -> 11.3 -Continue to monitor for signs and symptoms of infection -Repeat CBC as an outpatient  Obesity -Estimated body mass index is 36.01 kg/m as calculated from the following:   Height as of this encounter: 5\' 7"  (1.702 m).   Weight as of this encounter: 104.3 kg. -Weight Loss  Counseling Given   Hypophosphatemia -Patient's phosphorus level was 2.1 -Replete with sodium phosphate 20 mmol IV -Continue monitor and replete as necessary -Repeat phosphorus level as an  outpatient  Discharge Instructions  Discharge Instructions    Call MD for:  difficulty breathing, headache or visual disturbances   Complete by:  As directed    Call MD for:  extreme fatigue   Complete by:  As directed    Call MD for:  hives   Complete by:  As directed    Call MD for:  persistant dizziness or light-headedness   Complete by:  As directed    Call MD for:  persistant nausea and vomiting   Complete by:  As directed    Call MD for:  redness, tenderness, or signs of infection (pain, swelling, redness, odor or green/yellow discharge around incision site)   Complete by:  As directed    Call MD for:  severe uncontrolled pain   Complete by:  As directed    Call MD for:  temperature >100.4   Complete by:  As directed    Diet - Bonilla sodium heart healthy   Complete by:  As directed    Discharge instructions   Complete by:  As directed    You were cared for by a hospitalist during your hospital stay. If you have any questions about your discharge medications or the care you received while you were in the hospital after you are discharged, you can call the unit and ask to speak with the hospitalist on call if the hospitalist that took care of you is not available. Once you are discharged, your primary care physician will handle any further medical issues. Please note that NO REFILLS for any discharge medications will be authorized once you are discharged, as it is imperative that you return to your primary care physician (or establish a relationship with a primary care physician if you do not have one) for your aftercare needs so that they can reassess your need for medications and monitor your lab values.  Follow up with PCP, Cardiology, Orthopedic Surgery, and Neurology as an outpatient. Take all medications as prescribed. If symptoms change or worsen please return to the ED for evaluation   Increase activity slowly   Complete by:  As directed      Allergies as of 06/15/2018   No  Known Allergies     Medication List    STOP taking these medications   amoxicillin 500 MG capsule Commonly known as:  AMOXIL   bisacodyl 5 MG EC tablet Commonly known as:  DULCOLAX   docusate sodium 100 MG capsule Commonly known as:  COLACE   hydrochlorothiazide 25 MG tablet Commonly known as:  HYDRODIURIL   OVER THE COUNTER MEDICATION     TAKE these medications   aspirin EC 81 MG tablet Take 81 mg by mouth daily.   atorvastatin 40 MG tablet Commonly known as:  LIPITOR Take 40 mg by mouth daily.   BETA CAROTENE PO Take by mouth.   CAL-MAG-ZINC PO Take by mouth.   cholecalciferol 1000 units tablet Commonly known as:  VITAMIN D Take 1,000 Units by mouth daily.   esomeprazole 40 MG capsule Commonly known as:  NEXIUM Take 40 mg by mouth 2 (two) times daily before a meal.   GLUCOSAMINE CHONDROITIN ADV Tabs Take by mouth.   hydrocortisone cream 1 % Apply 1 application topically 2 (two) times daily as needed (rash).   Andrew Bonilla 75 MG tablet Commonly  known as:  AVAPRO Take 1 tablet (75 mg total) by mouth daily.   methocarbamol 500 MG tablet Commonly known as:  ROBAXIN Take 1 tablet (500 mg total) by mouth every 6 (six) hours as needed for muscle spasms.   multivitamin with minerals Tabs tablet Take 1 tablet by mouth daily.   OMEGA-3 FISH OIL PO Take by mouth.   oxyCODONE 5 MG immediate release tablet Commonly known as:  Oxy IR/ROXICODONE Take 1-2 tablets (5-10 mg total) by mouth every 6 (six) hours as needed for moderate pain (pain score 4-6). What changed:  how much to take   polyethylene glycol packet Commonly known as:  MIRALAX / GLYCOLAX Take 17 g by mouth 2 (two) times daily. What changed:    when to take this  reasons to take this   rivaroxaban 10 MG Tabs tablet Commonly known as:  XARELTO Take 1 tablet (10 mg total) by mouth daily with breakfast for 19 days. Then resume one 81 mg aspirin once a day.   SAW PALMETTO PO Take by mouth.    senna-docusate 8.6-50 MG tablet Commonly known as:  Senokot-S Take 1 tablet by mouth at bedtime.   tadalafil 10 MG tablet Commonly known as:  CIALIS Take 10 mg by mouth daily as needed for erectile dysfunction.   TURMERIC PO Take by mouth.   vitamin E 400 UNIT capsule Take 400 Units by mouth daily.            Durable Medical Equipment  (From admission, onward)         Start     Ordered   06/15/18 1515  DME 3-in-1  Once     06/15/18 1515   06/15/18 1515  For home use only DME lightweight manual wheelchair with seat cushion  (Wheelchairs)  Once    Comments:  Patient suffers from Generalized weakness and ambulatory dysfunction from recent TKA which impairs their ability to perform daily activities like Ambulating long distances in the home.  A [place] Rile will not resolve  issue with performing activities of daily living. A wheelchair will allow patient to safely perform daily activities. Patient is not able to propel themselves in the home using a standard weight wheelchair due to weakness. Patient can self propel in the lightweight wheelchair.  Accessories: elevating leg rests (ELRs), wheel locks, extensions and anti-tippers.   06/15/18 1515          No Known Allergies  Consultations:  Cardiology  Discussed Case with Neurology  Procedures/Studies: Dg Chest 2 View  Result Date: 06/12/2018 CLINICAL DATA:  Knee replacement 06/09/2018.  Syncope, dizziness EXAM: CHEST - 2 VIEW COMPARISON:  08/08/2016 FINDINGS: Aortic valve replacement. Mild cardiac enlargement without heart failure. Lungs are clear without infiltrate or effusion. IMPRESSION: No active cardiopulmonary disease. Electronically Signed   By: Franchot Gallo M.D.   On: 06/12/2018 12:23   Ct Head Wo Contrast  Result Date: 06/13/2018 CLINICAL DATA:  Altered level of consciousness. Recent knee replacement. EXAM: CT HEAD WITHOUT CONTRAST TECHNIQUE: Contiguous axial images were obtained from the base of the  skull through the vertex without intravenous contrast. COMPARISON:  Head CT dated 12/15/2011. FINDINGS: Brain: Ventricles are stable in size and configuration. Mild chronic small vessel ischemic change noted within the deep periventricular white matter regions bilaterally. There is no mass, hemorrhage, edema or other evidence of acute parenchymal abnormality. No extra-axial hemorrhage. Vascular: Chronic calcified atherosclerotic changes of the large vessels at the skull base. No unexpected hyperdense vessel. Skull: Normal.  Negative for fracture or focal lesion. Sinuses/Orbits: No acute finding. Other: None. IMPRESSION: No acute findings.  No intracranial mass, hemorrhage or edema. Electronically Signed   By: Franki Cabot M.D.   On: 06/13/2018 17:33   Ct Angio Chest Pe W/cm &/or Wo Cm  Result Date: 06/12/2018 CLINICAL DATA:  Recent left total knee arthroplasty on 06/09/2018 presents with dyspnea. EXAM: CT ANGIOGRAPHY CHEST WITH CONTRAST TECHNIQUE: Multidetector CT imaging of the chest was performed using the standard protocol during bolus administration of intravenous contrast. Multiplanar CT image reconstructions and MIPs were obtained to evaluate the vascular anatomy. CONTRAST:  180mL ISOVUE-370 IOPAMIDOL (ISOVUE-370) INJECTION 76% COMPARISON:  Same day CXR and chest radiographs dating back through 01/14/2012. FINDINGS: Cardiovascular: Cardiomegaly with aortic atherosclerosis and atherosclerosis at the origin of the great vessels. No pericardial effusion. Conventional branch pattern of the great vessels without significant stenosis. The ascending thoracic aorta measures up to 3.8 cm in caliber. Left main and three-vessel moderate coronary arteriosclerosis is noted. Median sternotomy sutures are in place with evidence of prior aortic valve replacement. No pulmonary embolus. No dilatation of the main pulmonary artery. Mediastinum/Nodes: No enlarged mediastinal, hilar, or axillary lymph nodes. Thyroid gland,  trachea, and esophagus demonstrate no significant findings. Lungs/Pleura: Bibasilar dependent and subsegmental atelectasis within both lower lobes and lingula. No effusion or pneumothorax. Upper Abdomen: No acute abnormality. Musculoskeletal: Mild degenerative change of the thoracic spine without acute osseous appearing abnormality. Review of the MIP images confirms the above findings. IMPRESSION: 1. No acute pulmonary embolus. 2. Cardiomegaly with coronary arteriosclerosis. Status post aortic valve replacement without complicating features. 3. No active pulmonary disease. Aortic Atherosclerosis (ICD10-I70.0). Electronically Signed   By: Ashley Royalty M.D.   On: 06/12/2018 18:17   Mr Brain Wo Contrast  Result Date: 06/15/2018 CLINICAL DATA:  Recurrent syncope EXAM: MRI HEAD WITHOUT CONTRAST TECHNIQUE: Multiplanar, multiecho pulse sequences of the brain and surrounding structures were obtained without intravenous contrast. COMPARISON:  CT head 06/13/2018 FINDINGS: Brain: Negative for acute infarct. Moderate atrophy. Negative for infarct or mass lesion. Chronic microhemorrhage left frontal cortex. Vascular: Normal arterial flow voids Skull and upper cervical spine: Negative Sinuses/Orbits: Negative Other: None IMPRESSION: No acute abnormality. Moderate atrophy. Chronic microhemorrhage left frontal lobe could be a cavernoma or chronic hypertensive hemorrhage. Electronically Signed   By: Franchot Gallo M.D.   On: 06/15/2018 11:32   Ct Abdomen Pelvis W Contrast  Result Date: 06/12/2018 CLINICAL DATA:  Acute generalized abdominal pain and constipation. Recent left knee arthroplasty 06/09/2018 EXAM: CT ABDOMEN AND PELVIS WITH CONTRAST TECHNIQUE: Multidetector CT imaging of the abdomen and pelvis was performed using the standard protocol following bolus administration of intravenous contrast. CONTRAST:  145mL ISOVUE-370 IOPAMIDOL (ISOVUE-370) INJECTION 76% as part of the chest, abdomen and pelvic CT studies.  COMPARISON:  12/15/2011 radiographs, 09/26/2004 CT FINDINGS: Lower chest: The included heart size is enlarged. No pericardial effusion. Status post aortic valve replacement. Coronary arteriosclerosis. Atelectasis noted in the right lower lobe and lingula. Hepatobiliary: Colonic interposition over the liver shadow. Homogeneous attenuation of the liver without mass. No biliary dilatation with the gallbladder is normal. Pancreas: 16 mm fatty lesion in the pancreatic head compatible with a small lipoma of the pancreas, unchanged in appearance to slightly smaller. No ductal dilatation or inflammation. Spleen: Normal size spleen without mass Adrenals/Urinary Tract: Normal bilateral adrenal glands. Simple cyst arising off the upper pole the left kidney measuring approximately 4.9 cm in diameter. No obstructive uropathy. The urinary bladder is unremarkable for the degree of  distention. Stomach/Bowel: Moderate stool retention within the right colon. Gaseous distention of large bowel is otherwise noted without mechanical bowel obstruction. The stomach and small intestine are nonacute. Normal-appearing appendix. The distal and terminal ileum are within normal limits. Vascular/Lymphatic: Moderate marked aortoiliac atherosclerosis without aneurysm. Patent branch vessels off the aorta. No lymphadenopathy. Reproductive: Prostatomegaly with the prostate measuring approximately 6 x 4.9 cm x 5.2 cm. Other: No free air nor free fluid. Musculoskeletal: Levoconvex curvature of the lumbar spine with lower lumbar degenerative disc disease most pronounced at L4-5. Osteoarthritic joint space narrowing of both hips. No acute nor suspicious osseous lesions. IMPRESSION: 1. Stable to slightly smaller lipoma of the pancreatic head now measuring 1.6 cm versus 2 cm previously. 2. Simple cyst arising off the upper pole the left kidney measuring approximately 4.9 cm in diameter. No obstructive uropathy. 3. Moderate stool retention within the right  colon. No bowel obstruction or inflammation. Normal appendix. 4. Prostatomegaly with the prostate measuring 6 x 4.9 x 5.2 cm. 5. Levoconvex curvature of the lumbar spine with lower lumbar degenerative disc disease most pronounced at L4-5 with vacuum disc phenomena. 6. Cardiomegaly with aortic valvular replacement and coronary arteriosclerosis. Electronically Signed   By: Ashley Royalty M.D.   On: 06/12/2018 18:12    ECHOCARDIOGRAM on 06/14/18 ------------------------------------------------------------------- Study Conclusions  - Left ventricle: The cavity size was normal. Wall thickness was   increased in a pattern of moderate LVH. Systolic function was   normal. The estimated ejection fraction was in the range of 60%   to 65%. Wall motion was normal; there were no regional wall   motion abnormalities. Doppler parameters are consistent with   abnormal left ventricular relaxation (grade 1 diastolic   dysfunction). The E/e&' ratio is between 8-15, suggesting   indeterminate LV filling pressure. - Aortic valve: Bioprosthetic valve (21 mm Edwards Magna-Ease). No   obstruction. Mean gradient (S): 10 mm Hg. Peak gradient (S): 19   mm Hg. Valve area (VTI): 1.7 cm^2. Valve area (Vmax): 1.75 cm^2.   Valve area (Vmean): 1.69 cm^2. - Mitral valve: Calcified annulus. Mildly thickened leaflets .   There was trivial regurgitation. - Left atrium: The atrium was normal in size. - Right atrium: The atrium was mildly dilated. - Tricuspid valve: There was trivial regurgitation. - Pulmonary arteries: PA peak pressure: 26 mm Hg (S). - Inferior vena cava: The vessel was normal in size. The   respirophasic diameter changes were in the normal range (>= 50%),   consistent with normal central venous pressure.  Impressions:  - Compared to a prior study in 07/2017, there have been no   significant changes.  Subjective: Seen and examined at bedside was doing well.  Had orthostatic vital signs done and did not  feel dizzy or lightheaded and did not pass out.  Had an abdominal binder given to him and he improved significantly.  Denies chest pain, lightheadedness or dizziness.  Ready to go home.  Family appreciative of care that was given to the patient.  Discharge Exam: Vitals:   06/15/18 1014 06/15/18 1420  BP: (!) 144/61 (!) 131/56  Pulse: 72 67  Resp: 20 20  Temp: 98.2 F (36.8 C) 98.6 F (37 C)  SpO2: 98% 98%   Vitals:   06/15/18 0119 06/15/18 0522 06/15/18 1014 06/15/18 1420  BP: (!) 163/64 (!) 154/67 (!) 144/61 (!) 131/56  Pulse: 79 74 72 67  Resp: 18 20 20 20   Temp: 98.4 F (36.9 C) 98.3 F (36.8 C) 98.2  F (36.8 C) 98.6 F (37 C)  TempSrc: Oral Oral Oral Oral  SpO2: 98% 97% 98% 98%  Weight:      Height:       General: Pt is alert, awake, not in acute distress Cardiovascular: RRR, S1/S2 +, no rubs, no gallops Respiratory: CTA bilaterally, no wheezing, no rhonchi Abdominal: Soft, NT, ND, bowel sounds + Extremities: no edema, no cyanosis; left knee had incisions are clean dry and intact  The results of significant diagnostics from this hospitalization (including imaging, microbiology, ancillary and laboratory) are listed below for reference.    Microbiology: No results found for this or any previous visit (from the past 240 hour(s)).   Labs: BNP (last 3 results) Recent Labs    06/12/18 1239 06/12/18 1420  BNP SPECIMEN CONTAMINATED, UNABLE TO PERFORM TEST(S). 59.9   Basic Metabolic Panel: Recent Labs  Lab 06/11/18 0451 06/12/18 1420 06/13/18 1831 06/14/18 0801 06/14/18 0805 06/15/18 0530  NA 137 133* 132*  --  131* 133*  K 4.0 4.0 3.7  --  3.3* 4.4  CL 96* 94* 92*  --  93* 101  CO2 27 27 28   --  24 25  GLUCOSE 120* 115* 124*  --  111* 104*  BUN 14 13 17   --  13 16  CREATININE 0.84 0.91 0.83  --  0.77 0.68  CALCIUM 9.5 9.0 9.2  --  8.9 8.2*  MG  --   --   --  1.9  --  2.2  PHOS  --   --   --  3.4  --  2.1*   Liver Function Tests: Recent Labs  Lab  06/12/18 1420 06/15/18 0530  AST 34 40  ALT 27 47*  ALKPHOS 47 53  BILITOT 1.1 1.7*  PROT 6.5 6.0*  ALBUMIN 3.5 3.0*   No results for input(s): LIPASE, AMYLASE in the last 168 hours. No results for input(s): AMMONIA in the last 168 hours. CBC: Recent Labs  Lab 06/12/18 1239 06/12/18 1420 06/13/18 1831 06/14/18 0805 06/15/18 0530  WBC SPECIMEN CONTAMINATED, UNABLE TO PERFORM TEST(S). 11.3* 12.2* 11.1* 11.3*  NEUTROABS SPECIMEN CONTAMINATED, UNABLE TO PERFORM TEST(S). 7.2  --  6.8 6.0  HGB SPECIMEN CONTAMINATED, UNABLE TO PERFORM TEST(S). 10.8* 10.4* 10.7* 9.7*  HCT SPECIMEN CONTAMINATED, UNABLE TO PERFORM TEST(S). 31.8* 30.8* 31.3* 28.5*  MCV SPECIMEN CONTAMINATED, UNABLE TO PERFORM TEST(S). 91.1 91.1 90.5 90.2  PLT SPECIMEN CONTAMINATED, UNABLE TO PERFORM TEST(S). 272 243 309 311   Cardiac Enzymes: Recent Labs  Lab 06/12/18 1420 06/13/18 1831 06/14/18 0801  TROPONINI <0.03 <0.03 <0.03   BNP: Invalid input(s): POCBNP CBG: Recent Labs  Lab 06/13/18 1749 06/15/18 0627  GLUCAP 99 101*   D-Dimer No results for input(s): DDIMER in the last 72 hours. Hgb A1c No results for input(s): HGBA1C in the last 72 hours. Lipid Profile No results for input(s): CHOL, HDL, LDLCALC, TRIG, CHOLHDL, LDLDIRECT in the last 72 hours. Thyroid function studies No results for input(s): TSH, T4TOTAL, T3FREE, THYROIDAB in the last 72 hours.  Invalid input(s): FREET3 Anemia work up No results for input(s): VITAMINB12, FOLATE, FERRITIN, TIBC, IRON, RETICCTPCT in the last 72 hours. Urinalysis    Component Value Date/Time   COLORURINE YELLOW 06/13/2018 1831   APPEARANCEUR CLEAR 06/13/2018 1831   LABSPEC 1.016 06/13/2018 1831   PHURINE 7.0 06/13/2018 1831   GLUCOSEU NEGATIVE 06/13/2018 1831   HGBUR NEGATIVE 06/13/2018 1831   BILIRUBINUR NEGATIVE 06/13/2018 1831   KETONESUR NEGATIVE 06/13/2018 1831   PROTEINUR  NEGATIVE 06/13/2018 1831   UROBILINOGEN 0.2 01/14/2012 1529   NITRITE  NEGATIVE 06/13/2018 1831   LEUKOCYTESUR NEGATIVE 06/13/2018 1831   Sepsis Labs Invalid input(s): PROCALCITONIN,  WBC,  LACTICIDVEN Microbiology No results found for this or any previous visit (from the past 240 hour(s)).  Time coordinating discharge: 35 minutes  SIGNED:  Kerney Elbe, DO [facility] 06/15/2018, 3:32 PM Pager is on [place]  If 7PM-7AM, please contact night-coverage [email or web address] Password [email or web address]

## 2018-06-15 NOTE — Care Management (Signed)
.      Durable Medical Equipment  (From admission, onward)         Start     Ordered   06/15/18 1515  DME 3-in-1  Once     06/15/18 1515   06/15/18 1515  For home use only DME lightweight manual wheelchair with seat cushion  (Wheelchairs)  Once    Comments:  Patient suffers from Generalized weakness and ambulatory dysfunction from recent TKA which impairs their ability to perform daily activities like Ambulating long distances in the home.  A Rolling [place] Rile will not resolve  issue with performing activities of daily living. A wheelchair will allow patient to safely perform daily activities. Patient is not able to propel themselves in the home using a standard weight wheelchair due to weakness. Patient can self propel in the lightweight wheelchair.  Accessories: elevating leg rests (ELRs), wheel locks, extensions and anti-tippers.   06/15/18 1515

## 2018-06-15 NOTE — Progress Notes (Signed)
Physical Therapy Treatment Patient Details Name: Andrew Bonilla MRN: [id number] DOB: 03-30-1933 Today's Date: 06/15/2018    History of Present Illness pt with LTKA on 06/09/18 returned home doing fairly well, then began to have syncope episodes, dizziness, flush when up and moving around. Went to the ED 2x due to these episodes and now admit to the unit for follow up.     PT Comments    * patient ambulated x 120' with RW. BP supine 150/68, HR 63, sit =155/64, post ambulation 171/78 HR 74. Recommend HHPT and a WC for safety at home  Follow Up Recommendations  Home health PT- start with 5x/week due to delay in therapy post DC.     Equipment Recommendations  Wheelchair (measurements PT)    Recommendations for Other Services       Precautions / Restrictions Precautions Precautions: Knee;Fall Precaution Comments: to have abdominal binder    Mobility  Bed Mobility               General bed mobility comments: in recliner  Transfers Overall transfer level: Needs assistance Equipment used: Rolling walker (2 wheeled) Transfers: Sit to/from Stand           General transfer comment: pt wanted to do it himself. Just has to do some rocking prior to lift off and slow to rise, but was able to without me assisting.   Ambulation/Gait Ambulation/Gait assistance: Min guard;+2 safety/equipment Gait Distance (Feet): 120 Feet Assistive device: Rolling walker (2 wheeled) Gait Pattern/deviations: Step-to pattern;Step-through pattern     General Gait Details: smaller steps but steady, and step through pattern. L knee still very stiff    Stairs             Wheelchair Mobility    Modified Rankin (Stroke Patients Only)       Balance                                            Cognition Arousal/Alertness: Awake/alert                                            Exercises Total Joint Exercises Heel Slides: AAROM;Seated;Left;10  reps;PROM    General Comments        Pertinent Vitals/Pain Faces Pain Scale: Hurts a little bit Pain Location: Left knee  Pain Descriptors / Indicators: Discomfort Pain Intervention(s): Monitored during session;Premedicated before session;Ice applied    Home Living                      Prior Function            PT Goals (current goals can now be found in the care plan section)      Frequency    Min 5X/week      PT Plan Current plan remains appropriate    Co-evaluation              AM-PAC PT "6 Clicks" Daily Activity  Outcome Measure  Difficulty turning over in bed (including adjusting bedclothes, sheets and blankets)?: A Little Difficulty moving from lying on back to sitting on the side of the bed? : A Little Difficulty sitting down on and standing up from a chair with arms (e.g., wheelchair, bedside commode, etc,.)?:  A Little Help needed moving to and from a bed to chair (including a wheelchair)?: A Little Help needed walking in hospital room?: A Little Help needed climbing 3-5 steps with a railing? : A Lot 6 Click Score: 17    End of Session Equipment Utilized During Treatment: Gait belt Activity Tolerance: Patient tolerated treatment well Patient left: in chair;with call bell/phone within reach;with family/visitor present Nurse Communication: Mobility status PT Visit Diagnosis: Other abnormalities of gait and mobility (R26.89) Pain - Right/Left: Left Pain - part of body: Knee     Time: 0045-9977 PT Time Calculation (min) (ACUTE ONLY): 59 min  Charges:  $Gait Training: 23-37 mins $Self Care/Home Management: [place] Pager [phone] Office [phone]    Claretha Cooper 06/15/2018, 3:57 PM

## 2018-06-15 NOTE — Progress Notes (Signed)
Spoke with cardiologist this morning. He does not want pt to go off of telemetry for procedures or imaging. RN is to transport with patient if needed to watch telemetry.

## 2018-06-15 NOTE — Care Management Obs Status (Signed)
[place] NOTIFICATION   Patient Details  Name: Andrew Bonilla MRN: [id number] Date of Birth: 1933-05-05   Medicare Observation Status Notification Given:  Yes    Erenest Rasher, RN 06/15/2018, 3:58 PM

## 2018-06-15 NOTE — Progress Notes (Signed)
Pt discharge instructions were reviewed with the patient and family members. Family and pt members currently awaiting for DME equipment to be delivered. No questions or concerns at this time.

## 2018-06-15 NOTE — Clinical Social Work Note (Signed)
Clinical Social Work Assessment  Patient Details  Name: Andrew Bonilla MRN: [id number] Date of Birth: August 27, 1933  Date of referral:  06/15/18               Reason for consult:  Facility Placement                Permission sought to share information with:  Facility Art therapist granted to share information::  Yes, Verbal Permission Granted  Name::     Rohnan Bartleson  Agency::  SNF  Relationship::  Spouse  Contact Information:  [phone]  Housing/Transportation Living arrangements for the past 2 months:  [place] of Information:  Patient, Adult Children, Spouse Patient Interpreter Needed:  None Criminal Activity/Legal Involvement Pertinent to Current Situation/Hospitalization:  No - Comment as needed Significant Relationships:  Adult Children, Spouse Lives with:  Spouse Do you feel safe going back to the place where you live?  Yes Need for family participation in patient care:  Yes (Comment)  Care giving concerns:  Patient is weak and has difficulty ambulating. Family feels he is unsafe to discharge home. PT re-eval pending.   Social Worker assessment / plan:  CSW met with patient, spouse, and daughters at bedside to discuss discharge plan per physician's request. PT recommendation for home with [facility], however, patient and family interested in rehab. Family wants patient to go to [facility] and not a SNF.   Family feels patient will benefit from rehab post discharge but do not wish for SNF. CSW explained SNF process at their request as a backup plan. Family is interested in private paying for rehab. They also have questions regarding DME.   CSW alerted RNCM of family's questions regarding [facility] and DME.   CSW will continue to follow incase needs arise.  Employment status:  Retired Nurse, adult PT Recommendations:  Home with [place] / Referral to community resources:     Patient/Family's Response to care:   Family was appreciative of CSW's discussion and asked for more information regarding [facility]. RNCM will discuss options with them.  Patient/Family's Understanding of and Emotional Response to Diagnosis, Current Treatment, and Prognosis:  Patient and family seemingly understand current hospitalization. CSW explained SNF process in case they decide to pursue SNF placement at discharge.  Emotional Assessment Appearance:  Appears older than stated age Attitude/Demeanor/Rapport:    Affect (typically observed):  Appropriate, Pleasant Orientation:  Oriented to Self, Oriented to Place, Oriented to  Time, Oriented to Situation Alcohol / Substance use:  Not Applicable Psych involvement (Current and /or in the community):  No (Comment)  Discharge Needs  Concerns to be addressed:  Care Coordination Readmission within the last 30 days:  No Current discharge risk:  Physical Impairment Barriers to Discharge:  Ship broker, Continued Medical Work up   [employer], [place] 06/15/2018, 2:14 PM

## 2018-06-15 NOTE — Care Management Note (Signed)
Case Management Note  Patient Details  Name: Andrew Bonilla MRN: [id number] Date of Birth: 08-08-1933  Subjective/Objective:  Syncope                  Action/Plan: NCM spoke to pt, wife and adult children at bedside. Pt active with [facility] for HHPT. Has RW and 3n1 bedside commode. Family states bucket was missing from commode. Contacted [facility] rep for light weight manual wheelchair and commode bucket. They will be able to deliver to room or home at approximately 7 pm. [facility] has dtr's Karen's contact number. Contacted [place] with resumption of care.   Expected Discharge Date:  06/15/18               Expected Discharge Plan:  [place]  In-House Referral:  Clinical Social Work  Discharge planning Services  CM Consult  Post Acute Care Choice:  Home Health Choice offered to:  Adult Children  DME Arranged:  Youth worker wheelchair with seat cushion DME Agency:  [place]:  PT [place] Agency:  [facility]  Status of Service:  Completed, signed off  If discussed at [employer] of Stay Meetings, dates discussed:    Additional Comments:  Erenest Rasher, RN 06/15/2018, 4:18 PM

## 2018-06-15 NOTE — Progress Notes (Signed)
    Durable Medical Equipment  (From admission, onward)         Start     Ordered   06/15/18 1515  DME 3-in-1  Once     06/15/18 1515   06/15/18 1515  For home use only DME lightweight manual wheelchair with seat cushion  (Wheelchairs)  Once    Comments:  Patient suffers from Generalized weakness and ambulatory dysfunction from recent TKA and medical complications of syncopal episodes and high fall risk  which impairs their ability to perform daily activities like Ambulating long distances in the home.  A Rolling [place] Rile will not resolve  issue with performing activities of daily living. A wheelchair will allow patient to safely perform daily activities. Patient is not able to propel themselves in the home using a standard weight wheelchair due to weakness. Patient can self propel in the lightweight wheelchair.  Accessories: elevating leg rests (ELRs), wheel locks, extensions and anti-tippers.   06/15/18 [place] Pager [phone] Office [phone]

## 2018-06-15 NOTE — Consult Note (Signed)
CARDIOLOGY CONSULT NOTE  Patient ID: MOHAMEDAMIN NIFONG, MRN: [id number], DOB/AGE: 82/02/1933 82 y.o. Admit date: 06/13/2018 Date of Consult: 06/15/2018  Primary Physician: Wenda Low, MD Primary Cardiologist: [facility] BELEN ZWAHLEN is a 84 y.o. male who is being seen today for the evaluation of syncope at the request of [facility].       HPI TREVYN LUMPKIN is a 84 y.o. male with a recent knee replacement 06/09/18 And was admitted to the emergency room 10/10, 1 day following discharge because of recurrent presyncope was hydrated and discharged and then re-evaluated 10/11 with recurrent syncope  These episodes all occurred with standing.  It is his impression that there was 1 to 2 minutes of lightheadedness and tunnel vision that preceded it.  These episodes were followed by diaphoresis and fatigue.  Following surgery on Monday he ate well but he did Tuesday,'s p.o. intake with considerably decreased Wednesday going forward.  This is persisted.  10/11 he was admitted to hospital.  He was being prepared for MRI yesterday (see below) and was off the monitor when he had recurrent syncope.  There is evidence of sinus tachycardia around this time.    1 of his syncopal episodes at home was associated with eye deviation to the right and left hand shaking.  There was also some incontinence and bilateral shaking    History of syncope and presyncope in the old chart which the patient and his wife describe to sialoliths although it is not noted in the record..    Orthostatic vital signs in hospital have been notable for no change in blood pressure but a significant change in heart rate (74--102)   Bioprosthetic AVR 2013   Echocardiogram EF 60-65% LVH moderate bioprosthetic AVR in place CT negative for pulmonary embolism   Past Medical History:  Diagnosis Date  . Arthritis   . Constipation   . Deaf, left    WEARS HEARING AID RIGHT EAR   . GERD (gastroesophageal reflux disease)   . Hard of  hearing   . Heart murmur    mod-severe aortic stenosis   . Hyperlipemia   . Hypertension   . Pneumonia    in college  . Prostate enlargement   . Recurrent upper respiratory infection (URI)    12/2011  . S/P AVR (aortic valve replacement) 01/16/2012   [facility] Ease 18mm pericardial tissue valve      Surgical History:  Past Surgical History:  Procedure Laterality Date  . AORTIC VALVE REPLACEMENT  01/17/2012   Procedure: AORTIC VALVE REPLACEMENT (AVR);  Surgeon: Ivin Poot, MD;  Location: [place];  Service: Open Heart Surgery;  Laterality: N/A;  . CARDIAC CATHETERIZATION  12/17/2011   mild nonobstructive CAD - 20% LAD stenosis, 30% OM stenosis, 20% Cfx, 20% RCA lesion  . JOINT REPLACEMENT     Left total knee Dr. Wynelle Link 06-09-18  . LEFT HEART CATHETERIZATION WITH CORONARY ANGIOGRAM N/A 12/17/2011   Procedure: LEFT HEART CATHETERIZATION WITH CORONARY ANGIOGRAM;  Surgeon: Pixie Casino, MD;  Location: [facility] CATH LAB;  Service: Cardiovascular;  Laterality: N/A;  . TEE WITHOUT CARDIOVERSION  01/2012   EF 50-55%; calcified AV annulus, severely calcified AV leaflets, mild regurg (AVR)  . TOTAL KNEE ARTHROPLASTY Left 06/09/2018   Procedure: LEFT TOTAL KNEE ARTHROPLASTY;  Surgeon: Gaynelle Arabian, MD;  Location: [facility];  Service: Orthopedics;  Laterality: Left;     Home Meds: Prior to Admission medications   Medication Sig Start Date End Date  Taking? Authorizing Provider  amoxicillin (AMOXIL) 500 MG capsule Take 4 capsules (2,000 mg total) by mouth once. TAKE 2 HOURS BEFORE ANY DENTAL PROCEDURE 08/04/14  Yes Prescott Gum, Collier Salina, MD  atorvastatin (LIPITOR) 40 MG tablet Take 40 mg by mouth daily.   Yes [provider]  bisacodyl (DULCOLAX) 5 MG EC tablet Take 10 mg by mouth daily as needed for mild constipation or moderate constipation.   Yes [provider]  docusate sodium (COLACE) 100 MG capsule Take 100 mg by mouth 2 (two) times daily.   Yes [provider]    esomeprazole (NEXIUM) 40 MG capsule Take 40 mg by mouth 2 (two) times daily before a meal.    Yes [provider]  hydrochlorothiazide (HYDRODIURIL) 25 MG tablet Take 25 mg by mouth daily.   Yes [provider]  hydrocortisone cream 1 % Apply 1 application topically 2 (two) times daily as needed (rash).   Yes [provider]  irbesartan (AVAPRO) 75 MG tablet Take 1 tablet (75 mg total) by mouth daily. 01/14/18  Yes Hilty, Nadean Corwin, MD  methocarbamol (ROBAXIN) 500 MG tablet Take 1 tablet (500 mg total) by mouth every 6 (six) hours as needed for muscle spasms. 06/11/18  Yes Edmisten, Kristie L, PA  OVER THE COUNTER MEDICATION Take 1 Dose by mouth daily. Smooth move tea. Has senna to help with constipation   Yes [provider]  oxyCODONE (OXY IR/ROXICODONE) 5 MG immediate release tablet Take 1-2 tablets (5-10 mg total) by mouth every 6 (six) hours as needed for moderate pain (pain score 4-6). Patient taking differently: Take 2.5-10 mg by mouth every 6 (six) hours as needed for moderate pain (pain score 4-6).  06/11/18  Yes Edmisten, Kristie L, PA  polyethylene glycol (MIRALAX / GLYCOLAX) packet Take 17 g by mouth daily as needed for mild constipation.    Yes [provider]  rivaroxaban (XARELTO) 10 MG TABS tablet Take 1 tablet (10 mg total) by mouth daily with breakfast for 19 days. Then resume one 81 mg aspirin once a day. 06/11/18 06/30/18 Yes Edmisten, Kristie L, PA  tadalafil (CIALIS) 10 MG tablet Take 10 mg by mouth daily as needed for erectile dysfunction.    Yes [provider]  aspirin EC 81 MG tablet Take 81 mg by mouth daily.    [provider]  BETA CAROTENE PO Take by mouth.    [provider]  Calcium-Magnesium-Zinc (CAL-MAG-ZINC PO) Take by mouth.    [provider]  cholecalciferol (VITAMIN D) 1000 units tablet Take 1,000 Units by mouth daily.    [provider]  Misc Natural Products (GLUCOSAMINE  CHONDROITIN ADV) TABS Take by mouth.    [provider]  Multiple Vitamin (MULTIVITAMIN WITH MINERALS) TABS tablet Take 1 tablet by mouth daily.    [provider]  Omega-3 Fatty Acids (OMEGA-3 FISH OIL PO) Take by mouth.    [provider]  Saw Palmetto, Serenoa repens, (SAW PALMETTO PO) Take by mouth.    [provider]  TURMERIC PO Take by mouth.    [provider]  vitamin E 400 UNIT capsule Take 400 Units by mouth daily.    [provider]    Inpatient Medications:  . atorvastatin  40 mg Oral q1800  . irbesartan  75 mg Oral Daily  . multivitamin with minerals  1 tablet Oral Daily  . pantoprazole  40 mg Oral BID AC  . polyethylene glycol  17 g Oral  BID  . rivaroxaban  10 mg Oral Q breakfast  . senna-docusate  1 tablet Oral BID  . sodium chloride flush  3 mL Intravenous Q12H    Allergies: No Known Allergies  Social History   Socioeconomic History  . Marital status: Married    Spouse name: Not on file  . Number of children: 4  . Years of education: Not on file  . Highest education level: Not on file  Occupational History  . Occupation: Engineer, water  Social Needs  . Financial resource strain: Not on file  . Food insecurity:    Worry: Not on file    Inability: Not on file  . Transportation needs:    Medical: Not on file    Non-medical: Not on file  Tobacco Use  . Smoking status: Never Smoker  . Smokeless tobacco: Never Used  Substance and Sexual Activity  . Alcohol use: Not Currently    Alcohol/week: 1.0 - 2.0 standard drinks    Types: 1 - 2 Standard drinks or equivalent per week    Comment: SOCIAL   . Drug use: Never  . Sexual activity: Yes  Lifestyle  . Physical activity:    Days per week: Not on file    Minutes per session: Not on file  . Stress: Not on file  Relationships  . Social connections:    Talks on phone: Not on file    Gets together: Not on file    Attends religious service: Not on file     Active member of club or organization: Not on file    Attends meetings of clubs or organizations: Not on file    Relationship status: Not on file  . Intimate partner violence:    Fear of current or ex partner: Not on file    Emotionally abused: Not on file    Physically abused: Not on file    Forced sexual activity: Not on file  Other Topics Concern  . Not on file  Social History Narrative   Retired Arts development officer     Family History  Problem Relation Age of Onset  . Coronary artery disease Father 51       MI/smoker  . Colon cancer Brother   . Ovarian cancer Sister   . COPD Sister      ROS:  Please see the history of present illness.     All other systems reviewed and negative.    Physical Exam: Blood pressure (!) 154/67, pulse 74, temperature 98.3 F (36.8 C), temperature source Oral, resp. rate 20, height 5\' 7"  (1.702 m), weight 104.3 kg, SpO2 97 %. General: Well developed, well nourished male in no acute distress. Head: Normocephalic, atraumatic, sclera non-icteric, no xanthomas, nares are without discharge. EENT: normal  Lymph Nodes:  none Neck: Left carotid bruits-could be transmitted murmur although not evident on the right. JVD not elevated. Back:without scoliosis kyphosis Lungs: Clear bilaterally to auscultation without wheezes, rales, or rhonchi. Breathing is unlabored. Heart: RRR with S1 S2.  2/6 systolic murmur . No rubs, or gallops appreciated. Abdomen: Soft, non-tender, non-distended with normoactive bowel sounds. No hepatomegaly. No rebound/guarding. No obvious abdominal masses. Msk:  Strength and tone appear normal for age. Extremities: No clubbing or cyanosis. No edema.  Distal pedal pulses are 2+ and equal bilaterally. Skin: Warm and Dry Neuro: Alert and oriented X 3. CN III-XII intact Grossly normal sensory and motor function . Psych:  Responds to questions appropriately with a normal affect.  Labs: Chemistry Recent Labs  Lab  06/12/18 1420 06/13/18 1831 06/14/18 0805 06/15/18 0530  NA 133* 132* 131* 133*  K 4.0 3.7 3.3* 4.4  CL 94* 92* 93* 101  CO2 27 28 24 25   GLUCOSE 115* 124* 111* 104*  BUN 13 17 13 16   CREATININE 0.91 0.83 0.77 0.68  CALCIUM 9.0 9.2 8.9 8.2*  PROT 6.5  --   --  6.0*  ALBUMIN 3.5  --   --  3.0*  AST 34  --   --  40  ALT 27  --   --  47*  ALKPHOS 47  --   --  53  BILITOT 1.1  --   --  1.7*  GFRNONAA >60 >60 >60 >60  GFRAA >60 >60 >60 >60  ANIONGAP 12 12 14 7      Hematology Recent Labs  Lab 06/13/18 1831 06/14/18 0805 06/15/18 0530  WBC 12.2* 11.1* 11.3*  RBC 3.38* 3.46* 3.16*  HGB 10.4* 10.7* 9.7*  HCT 30.8* 31.3* 28.5*  MCV 91.1 90.5 90.2  MCH 30.8 30.9 30.7  MCHC 33.8 34.2 34.0  RDW 12.7 12.6 12.4  PLT 243 309 311    Cardiac Enzymes Recent Labs  Lab 06/12/18 1420 06/13/18 1831 06/14/18 0801  TROPONINI <0.03 <0.03 <0.03   No results for input(s): TROPIPOC in the last 168 hours.   BNP Recent Labs  Lab 06/12/18 1239 06/12/18 1420  BNP SPECIMEN CONTAMINATED, UNABLE TO PERFORM TEST(S). 95.0     DDimer No results for input(s): DDIMER in the last 168 hours.   No results found for: CHOL, HDL, LDLCALC, TRIG  Thyroid Function Tests: No results for input(s): TSH, T4TOTAL, T3FREE, THYROIDAB in the last 72 hours.  Invalid input(s): FREET3 Miscellaneous No results found for: DDIMER  Radiology/Studies:  Dg Chest 2 View  Result Date: 06/12/2018 CLINICAL DATA:  Knee replacement 06/09/2018.  Syncope, dizziness EXAM: CHEST - 2 VIEW COMPARISON:  08/08/2016 FINDINGS: Aortic valve replacement. Mild cardiac enlargement without heart failure. Lungs are clear without infiltrate or effusion. IMPRESSION: No active cardiopulmonary disease. Electronically Signed   By: Franchot Gallo M.D.   On: 06/12/2018 12:23   Ct Head Wo Contrast  Result Date: 06/13/2018 CLINICAL DATA:  Altered level of consciousness. Recent knee replacement. EXAM: CT HEAD WITHOUT CONTRAST TECHNIQUE:  Contiguous axial images were obtained from the base of the skull through the vertex without intravenous contrast. COMPARISON:  Head CT dated 12/15/2011. FINDINGS: Brain: Ventricles are stable in size and configuration. Mild chronic small vessel ischemic change noted within the deep periventricular white matter regions bilaterally. There is no mass, hemorrhage, edema or other evidence of acute parenchymal abnormality. No extra-axial hemorrhage. Vascular: Chronic calcified atherosclerotic changes of the large vessels at the skull base. No unexpected hyperdense vessel. Skull: Normal. Negative for fracture or focal lesion. Sinuses/Orbits: No acute finding. Other: None. IMPRESSION: No acute findings.  No intracranial mass, hemorrhage or edema. Electronically Signed   By: Franki Cabot M.D.   On: 06/13/2018 17:33   Ct Angio Chest Pe W/cm &/or Wo Cm  Result Date: 06/12/2018 CLINICAL DATA:  Recent left total knee arthroplasty on 06/09/2018 presents with dyspnea. EXAM: CT ANGIOGRAPHY CHEST WITH CONTRAST TECHNIQUE: Multidetector CT imaging of the chest was performed using the standard protocol during bolus administration of intravenous contrast. Multiplanar CT image reconstructions and MIPs were obtained to evaluate the vascular anatomy. CONTRAST:  127mL ISOVUE-370 IOPAMIDOL (ISOVUE-370) INJECTION 76% COMPARISON:  Same day CXR and chest radiographs dating back through 01/14/2012.  FINDINGS: Cardiovascular: Cardiomegaly with aortic atherosclerosis and atherosclerosis at the origin of the great vessels. No pericardial effusion. Conventional branch pattern of the great vessels without significant stenosis. The ascending thoracic aorta measures up to 3.8 cm in caliber. Left main and three-vessel moderate coronary arteriosclerosis is noted. Median sternotomy sutures are in place with evidence of prior aortic valve replacement. No pulmonary embolus. No dilatation of the main pulmonary artery. Mediastinum/Nodes: No enlarged  mediastinal, hilar, or axillary lymph nodes. Thyroid gland, trachea, and esophagus demonstrate no significant findings. Lungs/Pleura: Bibasilar dependent and subsegmental atelectasis within both lower lobes and lingula. No effusion or pneumothorax. Upper Abdomen: No acute abnormality. Musculoskeletal: Mild degenerative change of the thoracic spine without acute osseous appearing abnormality. Review of the MIP images confirms the above findings. IMPRESSION: 1. No acute pulmonary embolus. 2. Cardiomegaly with coronary arteriosclerosis. Status post aortic valve replacement without complicating features. 3. No active pulmonary disease. Aortic Atherosclerosis (ICD10-I70.0). Electronically Signed   By: Ashley Royalty M.D.   On: 06/12/2018 18:17   Ct Abdomen Pelvis W Contrast  Result Date: 06/12/2018 CLINICAL DATA:  Acute generalized abdominal pain and constipation. Recent left knee arthroplasty 06/09/2018 EXAM: CT ABDOMEN AND PELVIS WITH CONTRAST TECHNIQUE: Multidetector CT imaging of the abdomen and pelvis was performed using the standard protocol following bolus administration of intravenous contrast. CONTRAST:  129mL ISOVUE-370 IOPAMIDOL (ISOVUE-370) INJECTION 76% as part of the chest, abdomen and pelvic CT studies. COMPARISON:  12/15/2011 radiographs, 09/26/2004 CT FINDINGS: Lower chest: The included heart size is enlarged. No pericardial effusion. Status post aortic valve replacement. Coronary arteriosclerosis. Atelectasis noted in the right lower lobe and lingula. Hepatobiliary: Colonic interposition over the liver shadow. Homogeneous attenuation of the liver without mass. No biliary dilatation with the gallbladder is normal. Pancreas: 16 mm fatty lesion in the pancreatic head compatible with a small lipoma of the pancreas, unchanged in appearance to slightly smaller. No ductal dilatation or inflammation. Spleen: Normal size spleen without mass Adrenals/Urinary Tract: Normal bilateral adrenal glands. Simple cyst  arising off the upper pole the left kidney measuring approximately 4.9 cm in diameter. No obstructive uropathy. The urinary bladder is unremarkable for the degree of distention. Stomach/Bowel: Moderate stool retention within the right colon. Gaseous distention of large bowel is otherwise noted without mechanical bowel obstruction. The stomach and small intestine are nonacute. Normal-appearing appendix. The distal and terminal ileum are within normal limits. Vascular/Lymphatic: Moderate marked aortoiliac atherosclerosis without aneurysm. Patent branch vessels off the aorta. No lymphadenopathy. Reproductive: Prostatomegaly with the prostate measuring approximately 6 x 4.9 cm x 5.2 cm. Other: No free air nor free fluid. Musculoskeletal: Levoconvex curvature of the lumbar spine with lower lumbar degenerative disc disease most pronounced at L4-5. Osteoarthritic joint space narrowing of both hips. No acute nor suspicious osseous lesions. IMPRESSION: 1. Stable to slightly smaller lipoma of the pancreatic head now measuring 1.6 cm versus 2 cm previously. 2. Simple cyst arising off the upper pole the left kidney measuring approximately 4.9 cm in diameter. No obstructive uropathy. 3. Moderate stool retention within the right colon. No bowel obstruction or inflammation. Normal appendix. 4. Prostatomegaly with the prostate measuring 6 x 4.9 x 5.2 cm. 5. Levoconvex curvature of the lumbar spine with lower lumbar degenerative disc disease most pronounced at L4-5 with vacuum disc phenomena. 6. Cardiomegaly with aortic valvular replacement and coronary arteriosclerosis. Electronically Signed   By: Ashley Royalty M.D.   On: 06/12/2018 18:12    EKG: Sinus at 77 Interval 17/09/40   Assessment and Plan:  Syncope  Orthostatic hypotension  Aortic valve replacement  Eye deviation to the right and left hand shaking with one episode   The episodes sound like orthostatic syncope.  They all occurred with standing.  His p.o.  intake has been diminished.  He has a long-standing history of orthostatic intolerance to which he has accommodated with getting up slowly.  Orthostatic vital signs at least on the first occasion were notable for change in heart rate of greater than 30.  Others have been less impressive.  Notably, he has not had blood pressure/heart rates recorded at 3 minutes.  The eye deviation in the left hand shaking are concerning for a focal neurological source, this is quite distinct from incontinence and bilateral shaking which is common with orthostatic/vasomotor syncope.  I await neurological input as to the potential explanation.  We will repeat orthostatic vital signs.  I think the idea of an abdominal binder is reasonable given the number of episodes.  While in hospital he should be out of bed to chair as much as possible  The likelihood that this represents a potentially life-threatening cardiac cause of syncope with his normal LV function and normal ECG is extremely remote.  Importantly, pulmonary embolism has been excluded  Virl Axe

## 2018-06-16 NOTE — Discharge Summary (Signed)
Physician Discharge Summary   Patient ID: Andrew Bonilla MRN: [id number] DOB/AGE: 1933-05-17 84 y.o.  Admit date: 06/09/2018 Discharge date: 06/11/2018  Primary Diagnosis: Osteoarthritis, left knee   Admission Diagnoses:  Past Medical History:  Diagnosis Date  . Arthritis   . Constipation   . Deaf, left    WEARS HEARING AID RIGHT EAR   . GERD (gastroesophageal reflux disease)   . Hard of hearing   . Heart murmur    mod-severe aortic stenosis   . Hyperlipemia   . Hypertension   . Pneumonia    in college  . Prostate enlargement   . Recurrent upper respiratory infection (URI)    12/2011  . S/P AVR (aortic valve replacement) 01/16/2012   [facility] Ease 45m pericardial tissue valve   Discharge Diagnoses:   Principal Problem:   OA (osteoarthritis) of knee Active Problems:   Osteoarthritis of knee  Estimated body mass index is 35.22 kg/m as calculated from the following:   Height as of this encounter: 5' 7"  (1.702 m).   Weight as of this encounter: 102 kg.  Procedure:  Procedure(s) (LRB): LEFT TOTAL KNEE ARTHROPLASTY (Left)   Consults: None  HPI: Andrew Bonilla a 84y.o. year old male with end stage OA of his left knee with progressively worsening pain and dysfunction. He has constant pain, with activity and at rest and significant functional deficits with difficulties even with ADLs. He has had extensive non-op management including analgesics, injections of cortisone and viscosupplements, and home exercise program, but remains in significant pain with significant dysfunction. Radiographs show bone on bone arthritis medial and patellofemoral. He presents now for left Total Knee Arthroplasty.    Laboratory Data: Admission on 06/09/2018, Discharged on 06/11/2018  Component Date Value Ref Range Status  . WBC 06/10/2018 13.6* 4.0 - 10.5 K/uL Final  . RBC 06/10/2018 3.67* 4.22 - 5.81 MIL/uL Final  . Hemoglobin 06/10/2018 11.3* 13.0 - 17.0 g/dL Final  . HCT 06/10/2018  32.3* 39.0 - 52.0 % Final  . MCV 06/10/2018 88.0  80.0 - 100.0 fL Final  . MCH 06/10/2018 30.8  26.0 - 34.0 pg Final  . MCHC 06/10/2018 35.0  30.0 - 36.0 g/dL Final  . RDW 06/10/2018 12.7  11.5 - 15.5 % Final  . Platelets 06/10/2018 283  150 - 400 K/uL Final   [facility]formed at [facility] [id number], [place] [id number] . Sodium 06/10/2018 135  135 - 145 mmol/L Final  . Potassium 06/10/2018 3.4* 3.5 - 5.1 mmol/L Final  . Chloride 06/10/2018 98  98 - 111 mmol/L Final  . CO2 06/10/2018 26  22 - 32 mmol/L Final  . Glucose, Bld 06/10/2018 128* 70 - 99 mg/dL Final  . BUN 06/10/2018 11  8 - 23 mg/dL Final  . Creatinine, Ser 06/10/2018 0.86  0.61 - 1.24 mg/dL Final  . Calcium 06/10/2018 9.0  8.9 - 10.3 mg/dL Final  . GFR calc non Af Amer 06/10/2018 >60  >60 mL/min Final  . GFR calc Af Amer 06/10/2018 >60  >60 mL/min Final   Comment: (NOTE) The eGFR has been calculated using the CKD EPI equation. This calculation has not been validated in all clinical situations. eGFR's persistently <60 mL/min signify possible Chronic Kidney Disease.   .Georgiann Hahngap 06/10/2018 11  5 - 15 Final   Performed at [facility] [address] [place] [address] . WBC 06/11/2018 17.3* 4.0 - 10.5 K/uL Final  .  RBC 06/11/2018 3.51* 4.22 - 5.81 MIL/uL Final  . Hemoglobin 06/11/2018 10.8* 13.0 - 17.0 g/dL Final  . HCT 06/11/2018 31.7* 39.0 - 52.0 % Final  . MCV 06/11/2018 90.3  80.0 - 100.0 fL Final  . MCH 06/11/2018 30.8  26.0 - 34.0 pg Final  . MCHC 06/11/2018 34.1  30.0 - 36.0 g/dL Final  . RDW 06/11/2018 12.8  11.5 - 15.5 % Final  . Platelets 06/11/2018 278  150 - 400 K/uL Final  . nRBC 06/11/2018 0.0  0.0 - 0.2 % Final   Performed at [facility], [place] [address], [place], [place] [address]  . Sodium 06/11/2018 137  135 - 145 mmol/L Final  . Potassium 06/11/2018 4.0  3.5 - 5.1 mmol/L Final  . Chloride 06/11/2018 96* 98 - 111 mmol/L  Final  . CO2 06/11/2018 27  22 - 32 mmol/L Final  . Glucose, Bld 06/11/2018 120* 70 - 99 mg/dL Final  . BUN 06/11/2018 14  8 - 23 mg/dL Final  . Creatinine, Ser 06/11/2018 0.84  0.61 - 1.24 mg/dL Final  . Calcium 06/11/2018 9.5  8.9 - 10.3 mg/dL Final  . GFR calc non Af Amer 06/11/2018 >60  >60 mL/min Final  . GFR calc Af Amer 06/11/2018 >60  >60 mL/min Final   Comment: (NOTE) The eGFR has been calculated using the CKD EPI equation. This calculation has not been validated in all clinical situations. eGFR's persistently <60 mL/min signify possible Chronic Kidney Disease.   Georgiann Hahn gap 06/11/2018 14  5 - 15 Final   Performed at [facility]: [place] [address], [place] [address] [place], [place] [address]  Hospital Outpatient Visit on 06/04/2018  Component Date Value Ref Range Status  . aPTT 06/04/2018 34  24 - 36 seconds Final   Performed at [facility], [place] [address], [place], [place] [address]  . WBC 06/04/2018 6.8  4.0 - 10.5 K/uL Final  . RBC 06/04/2018 4.31  4.22 - 5.81 MIL/uL Final  . Hemoglobin 06/04/2018 13.3  13.0 - 17.0 g/dL Final  . HCT 06/04/2018 38.4* 39.0 - 52.0 % Final  . MCV 06/04/2018 89.1  78.0 - 100.0 fL Final  . MCH 06/04/2018 30.9  26.0 - 34.0 pg Final  . MCHC 06/04/2018 34.6  30.0 - 36.0 g/dL Final  . RDW 06/04/2018 12.7  11.5 - 15.5 % Final  . Platelets 06/04/2018 302  150 - 400 K/uL Final   Performed at [facility], [place] [address] [place],  [address]  . Sodium 06/04/2018 135  135 - 145 mmol/L Final  . Potassium 06/04/2018 4.2  3.5 - 5.1 mmol/L Final  . Chloride 06/04/2018 96* 98 - 111 mmol/L Final  . CO2 06/04/2018 30  22 - 32 mmol/L Final  . Glucose, Bld 06/04/2018 91  70 - 99 mg/dL Final  . BUN 06/04/2018 15  8 - 23 mg/dL Final  . Creatinine, Ser 06/04/2018 0.84  0.61 - 1.24 mg/dL Final  . Calcium 06/04/2018 9.5  8.9 - 10.3 mg/dL Final  . Total Protein 06/04/2018 7.1  6.5 - 8.1 g/dL Final  . Albumin  06/04/2018 4.1  3.5 - 5.0 g/dL Final  . AST 06/04/2018 26  15 - 41 U/L Final  . ALT 06/04/2018 22  0 - 44 U/L Final  . Alkaline Phosphatase 06/04/2018 49  38 - 126 U/L Final  . Total Bilirubin 06/04/2018 1.2  0.3 - 1.2 mg/dL Final  . GFR calc non Af  Amer 06/04/2018 >60  >60 mL/min Final  . GFR calc Af Amer 06/04/2018 >60  >60 mL/min Final   Comment: (NOTE) The eGFR has been calculated using the CKD EPI equation. This calculation has not been validated in all clinical situations. eGFR's persistently <60 mL/min signify possible Chronic Kidney Disease.   Georgiann Hahn gap 06/04/2018 9  5 - 15 Final   Performed at [facility], [place] [address], [place], [place] [address]  . Prothrombin Time 06/04/2018 13.4  11.4 - 15.2 seconds Final  . INR 06/04/2018 1.03   Final   Performed at [facility], [place] [address] [place], [place] [address]  . ABO/RH(D) 06/04/2018 A POS   Final  . Antibody Screen 06/04/2018 NEG   Final  . Sample Expiration 06/04/2018 06/12/2018   Final  . Extend sample reason 06/04/2018    Final                   Value:NO TRANSFUSIONS OR PREGNANCY IN THE PAST 3 MONTHS Performed at [facility], [place] [address], [place], [place] [address]   . MRSA, PCR 06/04/2018 NEGATIVE  NEGATIVE Final  . Staphylococcus aureus 06/04/2018 NEGATIVE  NEGATIVE Final   Comment: (NOTE) The Xpert SA Assay (FDA approved for NASAL specimens in patients 35 years of age and older), is one component of a comprehensive surveillance program. It is not intended to diagnose infection nor to guide or monitor treatment. Performed at [facility], [place] [address], [place], [place] [address]   . ABO/RH(D) 06/04/2018    Final                   Value:A POS Performed at [facility], [place] [address], [place], [place] [address]      X-Rays:Dg Chest 2 View  Result Date: 06/12/2018 CLINICAL DATA:  Knee replacement  06/09/2018.  Syncope, dizziness EXAM: CHEST - 2 VIEW COMPARISON:  08/08/2016 FINDINGS: Aortic valve replacement. Mild cardiac enlargement without heart failure. Lungs are clear without infiltrate or effusion. IMPRESSION: No active cardiopulmonary disease. Electronically Signed   By: Franchot Gallo M.D.   On: 06/12/2018 12:23   Ct Head Wo Contrast  Result Date: 06/13/2018 CLINICAL DATA:  Altered level of consciousness. Recent knee replacement. EXAM: CT HEAD WITHOUT CONTRAST TECHNIQUE: Contiguous axial images were obtained from the base of the skull through the vertex without intravenous contrast. COMPARISON:  Head CT dated 12/15/2011. FINDINGS: Brain: Ventricles are stable in size and configuration. Mild chronic small vessel ischemic change noted within the deep periventricular white matter regions bilaterally. There is no mass, hemorrhage, edema or other evidence of acute parenchymal abnormality. No extra-axial hemorrhage. Vascular: Chronic calcified atherosclerotic changes of the large vessels at the skull base. No unexpected hyperdense vessel. Skull: Normal. Negative for fracture or focal lesion. Sinuses/Orbits: No acute finding. Other: None. IMPRESSION: No acute findings.  No intracranial mass, hemorrhage or edema. Electronically Signed   By: Franki Cabot M.D.   On: 06/13/2018 17:33   Ct Angio Chest Pe W/cm &/or Wo Cm  Result Date: 06/12/2018 CLINICAL DATA:  Recent left total knee arthroplasty on 06/09/2018 presents with dyspnea. EXAM: CT ANGIOGRAPHY CHEST WITH CONTRAST TECHNIQUE: Multidetector CT imaging of the chest was performed using the standard protocol during bolus administration of intravenous contrast. Multiplanar CT image reconstructions and MIPs were obtained to evaluate the vascular anatomy. CONTRAST:  185m ISOVUE-370 IOPAMIDOL (ISOVUE-370) INJECTION 76% COMPARISON:  Same day CXR and chest radiographs  dating back through 01/14/2012. FINDINGS: Cardiovascular: Cardiomegaly with aortic  atherosclerosis and atherosclerosis at the origin of the great vessels. No pericardial effusion. Conventional branch pattern of the great vessels without significant stenosis. The ascending thoracic aorta measures up to 3.8 cm in caliber. Left main and three-vessel moderate coronary arteriosclerosis is noted. Median sternotomy sutures are in place with evidence of prior aortic valve replacement. No pulmonary embolus. No dilatation of the main pulmonary artery. Mediastinum/Nodes: No enlarged mediastinal, hilar, or axillary lymph nodes. Thyroid gland, trachea, and esophagus demonstrate no significant findings. Lungs/Pleura: Bibasilar dependent and subsegmental atelectasis within both lower lobes and lingula. No effusion or pneumothorax. Upper Abdomen: No acute abnormality. Musculoskeletal: Mild degenerative change of the thoracic spine without acute osseous appearing abnormality. Review of the MIP images confirms the above findings. IMPRESSION: 1. No acute pulmonary embolus. 2. Cardiomegaly with coronary arteriosclerosis. Status post aortic valve replacement without complicating features. 3. No active pulmonary disease. Aortic Atherosclerosis (ICD10-I70.0). Electronically Signed   By: Ashley Royalty M.D.   On: 06/12/2018 18:17   Mr Brain Wo Contrast  Result Date: 06/15/2018 CLINICAL DATA:  Recurrent syncope EXAM: MRI HEAD WITHOUT CONTRAST TECHNIQUE: Multiplanar, multiecho pulse sequences of the brain and surrounding structures were obtained without intravenous contrast. COMPARISON:  CT head 06/13/2018 FINDINGS: Brain: Negative for acute infarct. Moderate atrophy. Negative for infarct or mass lesion. Chronic microhemorrhage left frontal cortex. Vascular: Normal arterial flow voids Skull and upper cervical spine: Negative Sinuses/Orbits: Negative Other: None IMPRESSION: No acute abnormality. Moderate atrophy. Chronic microhemorrhage left frontal lobe could be a cavernoma or chronic hypertensive hemorrhage.  Electronically Signed   By: Franchot Gallo M.D.   On: 06/15/2018 11:32   Ct Abdomen Pelvis W Contrast  Result Date: 06/12/2018 CLINICAL DATA:  Acute generalized abdominal pain and constipation. Recent left knee arthroplasty 06/09/2018 EXAM: CT ABDOMEN AND PELVIS WITH CONTRAST TECHNIQUE: Multidetector CT imaging of the abdomen and pelvis was performed using the standard protocol following bolus administration of intravenous contrast. CONTRAST:  149m ISOVUE-370 IOPAMIDOL (ISOVUE-370) INJECTION 76% as part of the chest, abdomen and pelvic CT studies. COMPARISON:  12/15/2011 radiographs, 09/26/2004 CT FINDINGS: Lower chest: The included heart size is enlarged. No pericardial effusion. Status post aortic valve replacement. Coronary arteriosclerosis. Atelectasis noted in the right lower lobe and lingula. Hepatobiliary: Colonic interposition over the liver shadow. Homogeneous attenuation of the liver without mass. No biliary dilatation with the gallbladder is normal. Pancreas: 16 mm fatty lesion in the pancreatic head compatible with a small lipoma of the pancreas, unchanged in appearance to slightly smaller. No ductal dilatation or inflammation. Spleen: Normal size spleen without mass Adrenals/Urinary Tract: Normal bilateral adrenal glands. Simple cyst arising off the upper pole the left kidney measuring approximately 4.9 cm in diameter. No obstructive uropathy. The urinary bladder is unremarkable for the degree of distention. Stomach/Bowel: Moderate stool retention within the right colon. Gaseous distention of large bowel is otherwise noted without mechanical bowel obstruction. The stomach and small intestine are nonacute. Normal-appearing appendix. The distal and terminal ileum are within normal limits. Vascular/Lymphatic: Moderate marked aortoiliac atherosclerosis without aneurysm. Patent branch vessels off the aorta. No lymphadenopathy. Reproductive: Prostatomegaly with the prostate measuring approximately 6 x  4.9 cm x 5.2 cm. Other: No free air nor free fluid. Musculoskeletal: Levoconvex curvature of the lumbar spine with lower lumbar degenerative disc disease most pronounced at L4-5. Osteoarthritic joint space narrowing of both hips. No acute nor suspicious osseous lesions. IMPRESSION: 1. Stable to slightly smaller lipoma of the pancreatic head now  measuring 1.6 cm versus 2 cm previously. 2. Simple cyst arising off the upper pole the left kidney measuring approximately 4.9 cm in diameter. No obstructive uropathy. 3. Moderate stool retention within the right colon. No bowel obstruction or inflammation. Normal appendix. 4. Prostatomegaly with the prostate measuring 6 x 4.9 x 5.2 cm. 5. Levoconvex curvature of the lumbar spine with lower lumbar degenerative disc disease most pronounced at L4-5 with vacuum disc phenomena. 6. Cardiomegaly with aortic valvular replacement and coronary arteriosclerosis. Electronically Signed   By: Ashley Royalty M.D.   On: 06/12/2018 18:12    EKG: Orders placed or performed in visit on 07/10/17  . EKG 12-Lead     Hospital Course: DELRICO MINEHART is a 84 y.o. who was admitted to [facility]. They were brought to the operating room on 06/09/2018 and underwent Procedure(s): LEFT TOTAL KNEE ARTHROPLASTY.  Patient tolerated the procedure well and was later transferred to the recovery room and then to the orthopaedic floor for postoperative care. They were given PO and IV analgesics for pain control following their surgery. They were given 24 hours of postoperative antibiotics of  Anti-infectives (From admission, onward)   Start     Dose/Rate Route Frequency Ordered Stop   06/09/18 1700  ceFAZolin (ANCEF) IVPB 2g/100 mL premix     2 g 200 mL/hr over 30 Minutes Intravenous Every 6 hours 06/09/18 1420 06/09/18 2330   06/09/18 0815  ceFAZolin (ANCEF) IVPB 2g/100 mL premix     2 g 200 mL/hr over 30 Minutes Intravenous On call to O.R. 06/09/18 0601 06/09/18 1111     and started on  DVT prophylaxis in the form of Xarelto.   PT and OT were ordered for total joint protocol. Discharge planning consulted to help with postop disposition and equipment needs. Patient had a good night on the evening of surgery. They started to get up OOB with therapy on POD #0. Hemovac drain was pulled without difficulty on day one. Potassium was noted to be low at 3.4, one dose of 40 mEq KCl given.  Continued to work with therapy into POD #2. Pt was seen during rounds on day two and was ready to go home pending progress with therapy. Dressing was changed and the incision was clean, dry, and intact with no drainage. Potassium was improved at 4.0. Pt worked with therapy for two additional sessions and was meeting their goals. He was discharged to home later that day in stable condition.  Diet: Cardiac diet Activity: WBAT Follow-up: in 2 weeks with Dr. Wynelle Link Disposition: Home with outpatient physical therapy at [facility] Discharged Condition: stable   Discharge Instructions    Call MD / Call 911   Complete by:  As directed    If you experience chest pain or shortness of breath, CALL 911 and be transported to the hospital emergency room.  If you develope a fever above 101 F, pus (white drainage) or increased drainage or redness at the wound, or calf pain, call your surgeon's office.   Change dressing   Complete by:  As directed    Change the dressing daily with sterile 4 x 4 inch gauze dressing and apply TED hose.   Constipation Prevention   Complete by:  As directed    Drink plenty of fluids.  Prune juice may be helpful.  You may use a stool softener, such as Colace (over the counter) 100 mg twice a day.  Use MiraLax (over the counter) for constipation as needed.  Diet - low sodium heart healthy   Complete by:  As directed    Discharge instructions   Complete by:  As directed    Dr. Gaynelle Arabian Total Joint Specialist [facility] [address], [place], [place] [address] [phone]  TOTAL KNEE REPLACEMENT POSTOPERATIVE DIRECTIONS  Knee Rehabilitation, Guidelines Following Surgery  Results after knee surgery are often greatly improved when you follow the exercise, range of motion and muscle strengthening exercises prescribed by your doctor. Safety measures are also important to protect the knee from further injury. Any time any of these exercises cause you to have increased pain or swelling in your knee joint, decrease the amount until you are comfortable again and slowly increase them. If you have problems or questions, call your caregiver or physical therapist for advice.   HOME CARE INSTRUCTIONS  Remove items at home which could result in a fall. This includes throw rugs or furniture in walking pathways.  ICE to the affected knee every three hours for 30 minutes at a time and then as needed for pain and swelling.  Continue to use ice on the knee for pain and swelling from surgery. You may notice swelling that will progress down to the foot and ankle.  This is normal after surgery.  Elevate the leg when you are not up walking on it.   Continue to use the breathing machine which will help keep your temperature down.  It is common for your temperature to cycle up and down following surgery, especially at night when you are not up moving around and exerting yourself.  The breathing machine keeps your lungs expanded and your temperature down. Do not place pillow under knee, focus on keeping the knee straight while resting   DIET You may resume your previous home diet once your are discharged from the hospital.  DRESSING / WOUND CARE / SHOWERING You may shower 3 days after surgery, but keep the wounds dry during showering.  You may use an occlusive plastic wrap (Press'n Seal for example), NO SOAKING/SUBMERGING IN THE BATHTUB.  If the bandage gets wet, change with a clean dry gauze.  If the incision gets wet, pat the wound dry with a clean towel. You may start showering  once you are discharged home but do not submerge the incision under water. Just pat the incision dry and apply a dry gauze dressing on daily. Change the surgical dressing daily and reapply a dry dressing each time.  ACTIVITY Walk with your walker as instructed. Use walker as long as suggested by your caregivers. Avoid periods of inactivity such as sitting longer than an hour when not asleep. This helps prevent blood clots.  You may resume a sexual relationship in one month or when given the OK by your doctor.  You may return to work once you are cleared by your doctor.  Do not drive a car for 6 weeks or until released by you surgeon.  Do not drive while taking narcotics.  WEIGHT BEARING Weight bearing as tolerated with assist device (walker, cane, etc) as directed, use it as long as suggested by your surgeon or therapist, typically at least 4-6 weeks.  POSTOPERATIVE CONSTIPATION PROTOCOL Constipation - defined medically as fewer than three stools per week and severe constipation as less than one stool per week.  One of the most common issues patients have following surgery is constipation.  Even if you have a regular bowel pattern at home, your normal regimen is likely to  be disrupted due to multiple reasons following surgery.  Combination of anesthesia, postoperative narcotics, change in appetite and fluid intake all can affect your bowels.  In order to avoid complications following surgery, here are some recommendations in order to help you during your recovery period.  Colace (docusate) - Pick up an over-the-counter form of Colace or another stool softener and take twice a day as long as you are requiring postoperative pain medications.  Take with a full glass of water daily.  If you experience loose stools or diarrhea, hold the colace until you stool forms back up.  If your symptoms do not get better within 1 week or if they get worse, check with your doctor.  Dulcolax (bisacodyl) - Pick up  over-the-counter and take as directed by the product packaging as needed to assist with the movement of your bowels.  Take with a full glass of water.  Use this product as needed if not relieved by Colace only.   MiraLax (polyethylene glycol) - Pick up over-the-counter to have on hand.  MiraLax is a solution that will increase the amount of water in your bowels to assist with bowel movements.  Take as directed and can mix with a glass of water, juice, soda, coffee, or tea.  Take if you go more than two days without a movement. Do not use MiraLax more than once per day. Call your doctor if you are still constipated or irregular after using this medication for 7 days in a row.  If you continue to have problems with postoperative constipation, please contact the office for further assistance and recommendations.  If you experience "the worst abdominal pain ever" or develop nausea or vomiting, please contact the office immediatly for further recommendations for treatment.  ITCHING  If you experience itching with your medications, try taking only a single pain pill, or even half a pain pill at a time.  You can also use Benadryl over the counter for itching or also to help with sleep.   TED HOSE STOCKINGS Wear the elastic stockings on both legs for three weeks following surgery during the day but you may remove then at night for sleeping.  MEDICATIONS See your medication summary on the "After Visit Summary" that the nursing staff will review with you prior to discharge.  You may have some home medications which will be placed on hold until you complete the course of blood thinner medication.  It is important for you to complete the blood thinner medication as prescribed by your surgeon.  Continue your approved medications as instructed at time of discharge.  PRECAUTIONS If you experience chest pain or shortness of breath - call 911 immediately for transfer to the hospital emergency department.  If you  develop a fever greater that 101 F, purulent drainage from wound, increased redness or drainage from wound, foul odor from the wound/dressing, or calf pain - CONTACT YOUR SURGEON.                                                   FOLLOW-UP APPOINTMENTS Make sure you keep all of your appointments after your operation with your surgeon and caregivers. You should call the office at the above phone number and make an appointment for approximately two weeks after the date of your surgery or on the date instructed by your surgeon  outlined in the "After Visit Summary".   RANGE OF MOTION AND STRENGTHENING EXERCISES  Rehabilitation of the knee is important following a knee injury or an operation. After just a few days of immobilization, the muscles of the thigh which control the knee become weakened and shrink (atrophy). Knee exercises are designed to build up the tone and strength of the thigh muscles and to improve knee motion. Often times heat used for twenty to thirty minutes before working out will loosen up your tissues and help with improving the range of motion but do not use heat for the first two weeks following surgery. These exercises can be done on a training (exercise) mat, on the floor, on a table or on a bed. Use what ever works the best and is most comfortable for you Knee exercises include:  Leg Lifts - While your knee is still immobilized in a splint or cast, you can do straight leg raises. Lift the leg to 60 degrees, hold for 3 sec, and slowly lower the leg. Repeat 10-20 times 2-3 times daily. Perform this exercise against resistance later as your knee gets better.  Quad and Hamstring Sets - Tighten up the muscle on the front of the thigh (Quad) and hold for 5-10 sec. Repeat this 10-20 times hourly. Hamstring sets are done by pushing the foot backward against an object and holding for 5-10 sec. Repeat as with quad sets.  Leg Slides: Lying on your back, slowly slide your foot toward your  buttocks, bending your knee up off the floor (only go as far as is comfortable). Then slowly slide your foot back down until your leg is flat on the floor again. Angel Wings: Lying on your back spread your legs to the side as far apart as you can without causing discomfort.  A rehabilitation program following serious knee injuries can speed recovery and prevent re-injury in the future due to weakened muscles. Contact your doctor or a physical therapist for more information on knee rehabilitation.   IF YOU ARE TRANSFERRED TO A SKILLED REHAB FACILITY If the patient is transferred to a skilled rehab facility following release from the hospital, a list of the current medications will be sent to the facility for the patient to continue.  When discharged from the skilled rehab facility, please have the facility set up the patient's [place] prior to being released. Also, the skilled facility will be responsible for providing the patient with their medications at time of release from the facility to include their pain medication, the muscle relaxants, and their blood thinner medication. If the patient is still at the rehab facility at time of the two week follow up appointment, the skilled rehab facility will also need to assist the patient in arranging follow up appointment in our office and any transportation needs.  MAKE SURE YOU:  Understand these instructions.  Get help right away if you are not doing well or get worse.    Pick up stool softner and laxative for home use following surgery while on pain medications. Do not submerge incision under water. Please use good hand washing techniques while changing dressing each day. May shower starting three days after surgery. Please use a clean towel to pat the incision dry following showers. Continue to use ice for pain and swelling after surgery. Do not use any lotions or creams on the incision until instructed by your surgeon.   Do  not put a pillow under the knee. Place it under  the heel.   Complete by:  As directed    Driving restrictions   Complete by:  As directed    No driving for two weeks   TED hose   Complete by:  As directed    Use stockings (TED hose) for three weeks on both leg(s).  You may remove them at night for sleeping.   Weight bearing as tolerated   Complete by:  As directed      Allergies as of 06/11/2018   No Known Allergies     Medication List    STOP taking these medications   aspirin 81 MG tablet   benzonatate 100 MG capsule Commonly known as:  TESSALON   beta carotene 30 MG capsule   CALCIUM-MAGNESIUM-ZINC PO   cholecalciferol 1000 units tablet Commonly known as:  VITAMIN D   CO Q 10 PO   glucosamine-chondroitin 500-400 MG tablet   multivitamin tablet   Omega 3 1000 MG Caps   SAW PALMETTO PO   TURMERIC PO   vitamin E 400 UNIT capsule     TAKE these medications   atorvastatin 40 MG tablet Commonly known as:  LIPITOR Take 40 mg by mouth daily.   esomeprazole 40 MG capsule Commonly known as:  NEXIUM Take 40 mg by mouth 2 (two) times daily before a meal.   irbesartan 75 MG tablet Commonly known as:  AVAPRO Take 1 tablet (75 mg total) by mouth daily.   methocarbamol 500 MG tablet Commonly known as:  ROBAXIN Take 1 tablet (500 mg total) by mouth every 6 (six) hours as needed for muscle spasms.   oxyCODONE 5 MG immediate release tablet Commonly known as:  Oxy IR/ROXICODONE Take 1-2 tablets (5-10 mg total) by mouth every 6 (six) hours as needed for moderate pain (pain score 4-6).   rivaroxaban 10 MG Tabs tablet Commonly known as:  XARELTO Take 1 tablet (10 mg total) by mouth daily with breakfast for 19 days. Then resume one 81 mg aspirin once a day.   tadalafil 10 MG tablet Commonly known as:  CIALIS Take 10 mg by mouth daily as needed for erectile dysfunction.            Discharge Care Instructions  (From admission, onward)         Start      Ordered   06/11/18 0000  Weight bearing as tolerated     06/11/18 0901   06/11/18 0000  Change dressing    Comments:  Change the dressing daily with sterile 4 x 4 inch gauze dressing and apply TED hose.   06/11/18 0901         Follow-up Information    Gaynelle Arabian, MD. Schedule an appointment as soon as possible for a visit on 06/24/2018.   Specialty:  Orthopedic Surgery Contact information: [address] [place] [address] [phone]           Signed: Theresa Duty, PA-C Orthopedic Surgery 06/16/2018, 8:07 AM

## 2018-06-25 ENCOUNTER — Encounter: Payer: Medicare Other | Admitting: Cardiothoracic Surgery

## 2018-06-26 ENCOUNTER — Encounter: Payer: Self-pay | Admitting: Internal Medicine

## 2018-06-26 ENCOUNTER — Ambulatory Visit: Payer: Medicare Other | Admitting: Internal Medicine

## 2018-06-26 VITALS — BP 138/74 | HR 87 | Ht 67.0 in | Wt 222.6 lb

## 2018-06-26 DIAGNOSIS — I951 Orthostatic hypotension: Secondary | ICD-10-CM | POA: Diagnosis not present

## 2018-06-26 DIAGNOSIS — I6523 Occlusion and stenosis of bilateral carotid arteries: Secondary | ICD-10-CM | POA: Diagnosis not present

## 2018-06-26 DIAGNOSIS — Z952 Presence of prosthetic heart valve: Secondary | ICD-10-CM

## 2018-06-26 NOTE — Progress Notes (Signed)
OFFICE NOTE  Chief Complaint:  Hospital follow-up  Primary Care Physician: Wenda Low, MD  HPI:  Andrew Bonilla is a 84 year old gentleman with a history of severe aortic stenosis who underwent [facility] Ease pericardial tissue valve placement in the aortic position Jan 16, 2012. Postoperatively he has done very well, had some shortness of breath and persistent cough which has improved, as well as some anemia which has also improved with iron supplementation.  Recently he had a presyncopal episode while he was in the shower. He was particularly warm shower which may play a role in it. Also has been having problems with coughing, which is paroxysmal.  He's had no further episodes since the syncopal episode but was seen by one of our physician assistants and he recommended an echocardiogram and monitor. The echocardiogram demonstrated a stable gradient across the aortic valve prosthesis with an EF of 50-55%. His monitor did not reveal any significant arrhythmias, pauses or any other findings that would explain his presyncopal episode.  Andrew Bonilla returns today for follow-up. He has been doing fairly well. He was increased for the past 6 months or so and denies any chest pain or worsening shortness of breath. He's had no presyncopal episodes. His cough is gone away. He is echo as mentioned was in January 2015 and showed no significant new findings. Unfortunately he's gained about 15-20 pounds and is in need of exercise and weight loss.  Andrew Bonilla has done well over the past year. He's had no further presyncopal episodes. He does get some shortness of breath when walking up hills. Recently he had some left chest discomfort. He reports however that it was tender to palpation and worse after lifting some heavy furniture. He does not note any worsening heaviness in his chest with exertion or particularly walking up steps. Review of recent lab work shows excellent cholesterol  control.  11/30/2016  Andrew Bonilla returns today for follow-up. Overall he's done well. Recently he and his wife went on a cruise in the [place].  Unfortunately he became ill and was seen in the emergency department after some sort of respiratory virus. Subsequently his wife developed a similar illness and then had a flare of diverticulitis. He denies any worsening shortness of breath or chest pain. He had cough associated with this however it is totally resolved. He underwent echo last year which demonstrated stable aortic valve gradients and normal LV function. He does have a history of bilateral carotid artery disease last assessed in 2013. At that time he had some moderate left carotid artery stenosis and very mild right carotid artery stenosis. He has been on intensive statin therapy, but is overdue for repeat carotid Dopplers. On the bright side, his weight is down 11 pounds since I last saw him.  07/10/2017  Andrew Bonilla was seen today in follow-up.  He is struggling with problems with his left knee.  He is told that he had a meniscal tear but also has bone-on-bone and may need knee replacement.  Currently he is undergoing some injections, but could be facing surgery with Dr. Maureen Ralphs.  As part of a preoperative evaluation I advised a repeat echocardiogram to reassess his aortic valve.  That was performed on July 05, 2017, this demonstrated an LVEF 60-65%, mild LVH, grade 1 diastolic dysfunction and a stable gradients across the aortic valve with a mean gradient of 14 mmHg and no evidence of obstruction.  Symptomatically he is done well, denying any chest pain or  worsening shortness of breath.  His long-standing cough has finally resolved.  The other issue is that he is recently gained a significant amount of weight.  He needs to work on this and certainly worsening his knee problems.  That being said, he can do 4 metabolic equivalents of activity, including walking up a flight of stairs and recently was  walking up a number of hills in [place] without any difficulty.  06/26/2018  Andrew Bonilla is seen today in hospital follow-up.  He recently underwent elective left knee replacement, which Was complicated by postoperative syncope and multiple episodes.  He was seen in the hospital by my partner Dr. Caryl Comes who felt that this was likely orthostatic/vasovagal syncope.  It may have been due to a combination of dehydration, post procedural CSF leak related to spinal anesthesia and/or narcotic use.  His symptoms have improved significantly although he has had recurrent cough.  This had completely resolved at one point however has returned and is most likely due happen in the evenings.  PMHx:  Past Medical History:  Diagnosis Date  . Arthritis   . Constipation   . Deaf, left    WEARS HEARING AID RIGHT EAR   . GERD (gastroesophageal reflux disease)   . Hard of hearing   . Heart murmur    mod-severe aortic stenosis   . Hyperlipemia   . Hypertension   . Pneumonia    in college  . Prostate enlargement   . Recurrent upper respiratory infection (URI)    12/2011  . S/P AVR (aortic valve replacement) 01/16/2012   [facility] Ease 57mm pericardial tissue valve    Past Surgical History:  Procedure Laterality Date  . AORTIC VALVE REPLACEMENT  01/17/2012   Procedure: AORTIC VALVE REPLACEMENT (AVR);  Surgeon: Ivin Poot, MD;  Location: [facility];  Service: Open Heart Surgery;  Laterality: N/A;  . CARDIAC CATHETERIZATION  12/17/2011   mild nonobstructive CAD - 20% LAD stenosis, 30% OM stenosis, 20% Cfx, 20% RCA lesion  . JOINT REPLACEMENT     Left total knee Dr. Wynelle Link 06-09-18  . LEFT HEART CATHETERIZATION WITH CORONARY ANGIOGRAM N/A 12/17/2011   Procedure: LEFT HEART CATHETERIZATION WITH CORONARY ANGIOGRAM;  Surgeon: Pixie Casino, MD;  Location: [facility] CATH LAB;  Service: Cardiovascular;  Laterality: N/A;  . TEE WITHOUT CARDIOVERSION  01/2012   EF 50-55%; calcified AV annulus, severely calcified AV  leaflets, mild regurg (AVR)  . TOTAL KNEE ARTHROPLASTY Left 06/09/2018   Procedure: LEFT TOTAL KNEE ARTHROPLASTY;  Surgeon: Gaynelle Arabian, MD;  Location: [facility];  Service: Orthopedics;  Laterality: Left;    FAMHx:  Family History  Problem Relation Age of Onset  . Coronary artery disease Father 31       MI/smoker  . Colon cancer Brother   . Ovarian cancer Sister   . COPD Sister     SOCHx:   reports that he has never smoked. He has never used smokeless tobacco. He reports that he drank about 1.0 - 2.0 standard drinks of alcohol per week. He reports that he does not use drugs.  ALLERGIES:  No Known Allergies  ROS: Pertinent items noted in HPI and remainder of comprehensive ROS otherwise negative.  HOME MEDS: Current Outpatient Medications  Medication Sig Dispense Refill  . aspirin EC 81 MG tablet Take 81 mg by mouth daily.    Marland Kitchen atorvastatin (LIPITOR) 40 MG tablet Take 40 mg by mouth daily.    Marland Kitchen BETA CAROTENE PO Take by mouth.    Marland Kitchen  Calcium-Magnesium-Zinc (CAL-MAG-ZINC PO) Take by mouth.    . cholecalciferol (VITAMIN D) 1000 units tablet Take 1,000 Units by mouth daily.    Marland Kitchen esomeprazole (NEXIUM) 40 MG capsule Take 40 mg by mouth 2 (two) times daily before a meal.     . hydrocortisone cream 1 % Apply 1 application topically 2 (two) times daily as needed (rash).    . irbesartan (AVAPRO) 75 MG tablet Take 1 tablet (75 mg total) by mouth daily. 30 tablet 6  . methocarbamol (ROBAXIN) 500 MG tablet Take 1 tablet (500 mg total) by mouth every 6 (six) hours as needed for muscle spasms. 40 tablet 0  . Misc Natural Products (GLUCOSAMINE CHONDROITIN ADV) TABS Take by mouth.    . Multiple Vitamin (MULTIVITAMIN WITH MINERALS) TABS tablet Take 1 tablet by mouth daily.    . Omega-3 Fatty Acids (OMEGA-3 FISH OIL PO) Take by mouth.    . oxyCODONE (OXY IR/ROXICODONE) 5 MG immediate release tablet Take 1-2 tablets (5-10 mg total) by mouth every 6 (six) hours as needed for moderate pain (pain score  4-6). (Patient taking differently: Take 2.5-10 mg by mouth every 6 (six) hours as needed for moderate pain (pain score 4-6). ) 56 tablet 0  . polyethylene glycol (MIRALAX / GLYCOLAX) packet Take 17 g by mouth 2 (two) times daily. 14 each 0  . rivaroxaban (XARELTO) 10 MG TABS tablet Take 1 tablet (10 mg total) by mouth daily with breakfast for 19 days. Then resume one 81 mg aspirin once a day. 19 tablet 0  . Saw Palmetto, Serenoa repens, (SAW PALMETTO PO) Take by mouth.    . senna-docusate (SENOKOT-S) 8.6-50 MG tablet Take 1 tablet by mouth at bedtime. 30 tablet 0  . tadalafil (CIALIS) 10 MG tablet Take 10 mg by mouth daily as needed for erectile dysfunction.     . TURMERIC PO Take by mouth.    . vitamin E 400 UNIT capsule Take 400 Units by mouth daily.     No current facility-administered medications for this visit.     LABS/IMAGING: No results found for this or any previous visit (from the past 48 hour(s)). No results found.  VITALS: BP 138/74   Pulse 87   Ht 5\' 7"  (1.702 m)   Wt 222 lb 9.6 oz (101 kg)   SpO2 97%   BMI 34.86 kg/m   EXAM: General appearance: alert, no distress and moderately obese Neck: no carotid bruit, no JVD and thyroid not enlarged, symmetric, no tenderness/mass/nodules Lungs: clear to auscultation bilaterally Heart: regular rate and rhythm, S1, S2 normal, no murmur, click, rub or gallop Abdomen: soft, non-tender; bowel sounds normal; no masses,  no organomegaly Extremities: s/p L TKR with healing incision Pulses: 2+ and symmetric Skin: Skin color, texture, turgor normal. No rashes or lesions Neurologic: Grossly normal Psych: Pleasant  EKG: Deferred  ASSESSMENT: 1. Orthostatic syncope 2. Severe aortic stenosis status post [facility] Ease 21 mm tissue valve in 2013 3. Hypertension 4. Dyslipidemia 5. Obesity 6. Mild to moderate bilateral carotid artery disease  PLAN: 1.   Mr. Woodrick likely had orthostatic syncope, probably related to spinal  anesthesia, hypovolemia/dehydration and narcotics. He has not had any further recurrences over the past week. He does have a recurrent cough again. This is non-productive and there is no evidence of CHF. Recent echo shows stable normal heart function with a normally functioning valve.  Follow-up with me annually or sooner as necessary.   Pixie Casino, MD, [facility], FACP  [place] Director of the [facility] Lipid Disorders &  Cardiovascular Risk Reduction Clinic Diplomate of the American Board of Clinical Lipidology Attending Cardiologist  Direct Dial: [phone]  Fax: [phone]  Website:  [email or web address] 06/26/2018, 2:05 PM

## 2018-06-26 NOTE — Patient Instructions (Signed)

## 2018-07-04 ENCOUNTER — Other Ambulatory Visit: Payer: Self-pay | Admitting: Internal Medicine

## 2018-07-04 ENCOUNTER — Ambulatory Visit
Admission: RE | Admit: 2018-07-04 | Discharge: 2018-07-04 | Disposition: A | Discharge status: Home / Self Care | Payer: Medicare Other | Source: Ambulatory Visit | Attending: Internal Medicine | Admitting: Internal Medicine

## 2018-07-04 DIAGNOSIS — R059 Cough, unspecified: Secondary | ICD-10-CM

## 2018-07-04 DIAGNOSIS — R05 Cough: Secondary | ICD-10-CM

## 2018-07-30 ENCOUNTER — Encounter: Payer: Medicare Other | Admitting: Cardiothoracic Surgery

## 2018-08-04 ENCOUNTER — Encounter: Payer: Self-pay | Admitting: Cardiothoracic Surgery

## 2018-09-16 ENCOUNTER — Other Ambulatory Visit: Payer: Self-pay

## 2018-09-16 ENCOUNTER — Ambulatory Visit: Payer: Medicare Other | Admitting: Cardiothoracic Surgery

## 2018-09-16 ENCOUNTER — Encounter: Payer: Self-pay | Admitting: Cardiothoracic Surgery

## 2018-09-16 VITALS — BP 130/64 | HR 69 | Resp 16 | Ht 67.0 in | Wt 213.0 lb

## 2018-09-16 DIAGNOSIS — Z952 Presence of prosthetic heart valve: Secondary | ICD-10-CM | POA: Diagnosis not present

## 2018-09-16 NOTE — Progress Notes (Signed)
PCP is Wenda Low, MD Referring Provider is Debara Pickett Nadean Corwin, MD  Chief Complaint  Patient presents with  . Routine Post Op    6 month f/u s/p AVReplacement 01/07/12.Marland Kitchenlast ov 12/22/17...last ECHO 06/14/18    HPI: The patient returns for 34-month follow-up.  He had AVR for aortic stenosis 7 years ago.  4 months ago he underwent left total knee replacement and had a preop echo which I personally reviewed.  The aortic valve is functioning well.  LV function is normal.  Transvalvular gradient is less than 10 mmHg.  Patient feels well.  He has lost 25 pounds over the past 4 months.  He has had no more dizzy spells.  He remains on the same cardiac medications including aspirin 81 mg.  Past Medical History:  Diagnosis Date  . Arthritis   . Constipation   . Deaf, left    WEARS HEARING AID RIGHT EAR   . GERD (gastroesophageal reflux disease)   . Hard of hearing   . Heart murmur    mod-severe aortic stenosis   . Hyperlipemia   . Hypertension   . Pneumonia    in college  . Prostate enlargement   . Recurrent upper respiratory infection (URI)    12/2011  . S/P AVR (aortic valve replacement) 01/16/2012   [facility] Ease 92mm pericardial tissue valve    Past Surgical History:  Procedure Laterality Date  . AORTIC VALVE REPLACEMENT  01/17/2012   Procedure: AORTIC VALVE REPLACEMENT (AVR);  Surgeon: Ivin Poot, MD;  Location: [facility];  Service: Open Heart Surgery;  Laterality: N/A;  . CARDIAC CATHETERIZATION  12/17/2011   mild nonobstructive CAD - 20% LAD stenosis, 30% OM stenosis, 20% Cfx, 20% RCA lesion  . JOINT REPLACEMENT     Left total knee Dr. Wynelle Link 06-09-18  . LEFT HEART CATHETERIZATION WITH CORONARY ANGIOGRAM N/A 12/17/2011   Procedure: LEFT HEART CATHETERIZATION WITH CORONARY ANGIOGRAM;  Surgeon: Pixie Casino, MD;  Location: [facility] CATH LAB;  Service: Cardiovascular;  Laterality: N/A;  . TEE WITHOUT CARDIOVERSION  01/2012   EF 50-55%; calcified AV annulus, severely calcified AV  leaflets, mild regurg (AVR)  . TOTAL KNEE ARTHROPLASTY Left 06/09/2018   Procedure: LEFT TOTAL KNEE ARTHROPLASTY;  Surgeon: Gaynelle Arabian, MD;  Location[facility];  Service: Orthopedics;  Laterality: Left;    Family History  Problem Relation Age of Onset  . Coronary artery disease Father 36       MI/smoker  . Colon cancer Brother   . Ovarian cancer Sister   . COPD Sister     Social History Social History   Tobacco Use  . Smoking status: Never Smoker  . Smokeless tobacco: Never Used  Substance Use Topics  . Alcohol use: Not Currently    Alcohol/week: 1.0 - 2.0 standard drinks    Types: 1 - 2 Standard drinks or equivalent per week    Comment: SOCIAL   . Drug use: Never    Current Outpatient Medications  Medication Sig Dispense Refill  . aspirin EC 81 MG tablet Take 81 mg by mouth daily.    Marland Kitchen atorvastatin (LIPITOR) 40 MG tablet Take 40 mg by mouth daily.    Marland Kitchen BETA CAROTENE PO Take by mouth.    . Calcium-Magnesium-Zinc (CAL-MAG-ZINC PO) Take by mouth.    . cholecalciferol (VITAMIN D) 1000 units tablet Take 1,000 Units by mouth daily.    Marland Kitchen esomeprazole (NEXIUM) 40 MG capsule Take 40 mg by mouth 2 (two) times daily before  a meal.     . hydrocortisone cream 1 % Apply 1 application topically 2 (two) times daily as needed (rash).    . irbesartan (AVAPRO) 75 MG tablet Take 1 tablet (75 mg total) by mouth daily. 30 tablet 6  . Misc Natural Products (GLUCOSAMINE CHONDROITIN ADV) TABS Take by mouth.    . Multiple Vitamin (MULTIVITAMIN WITH MINERALS) TABS tablet Take 1 tablet by mouth daily.    . Omega-3 Fatty Acids (OMEGA-3 FISH OIL PO) Take by mouth.    . polyethylene glycol (MIRALAX / GLYCOLAX) packet Take 17 g by mouth 2 (two) times daily. 14 each 0  . Saw Palmetto, Serenoa repens, (SAW PALMETTO PO) Take by mouth.    . senna-docusate (SENOKOT-S) 8.6-50 MG tablet Take 1 tablet by mouth at bedtime. 30 tablet 0  . tadalafil (CIALIS) 10 MG tablet Take 10 mg by mouth daily as needed for  erectile dysfunction.     . TURMERIC PO Take by mouth.    . vitamin E 400 UNIT capsule Take 400 Units by mouth daily.    . rivaroxaban (XARELTO) 10 MG TABS tablet Take 1 tablet (10 mg total) by mouth daily with breakfast for 19 days. Then resume one 81 mg aspirin once a day. 19 tablet 0   No current facility-administered medications for this visit.     No Known Allergies  Review of Systems  Intentional weight loss No chest pain or shortness of breath No presyncope or visual changes No recent dental complaints, dental hygiene every 6 months  BP 130/64 (BP Location: Left Arm, Patient Position: Sitting, Cuff Size: Large)   Pulse 69   Resp 16   Ht 5\' 7"  (1.702 m)   Wt 213 lb (96.6 kg)   SpO2 98% Comment: ON RA  BMI 33.36 kg/m  Physical Exam      Exam    General- alert and comfortable    Neck- no JVD, no cervical adenopathy palpable, no carotid bruit   Lungs- clear without rales, wheezes   Cor- regular rate and rhythm, no murmur , gallop   Abdomen- soft, non-tender   Extremities - warm, non-tender, minimal edema   Neuro- oriented, appropriate, no focal weakness   Diagnostic Tests: Images of echocardiogram September 2019 reviewed and are satisfactory.  Impression: Patient continues to do well after aortic valve replacement.  He remains in sinus rhythm.  His blood pressure is under control.  He is now successfully losing weight gradually.  Plan: Continue every 62-month office appointment.  He understands heart healthy lifestyle includes 30 minutes of aerobic activity including walking--5 days a week.  Continue current medications.   Len Childs, MD [facility] [phone]

## 2018-09-17 ENCOUNTER — Encounter: Payer: Medicare Other | Admitting: Cardiothoracic Surgery

## 2018-12-31 ENCOUNTER — Telehealth: Payer: Self-pay | Admitting: Internal Medicine

## 2018-12-31 NOTE — Telephone Encounter (Signed)
New Message   Patient has appointment on 5/4 and needs to be setup for virtual appointment please give him a call.

## 2019-01-02 ENCOUNTER — Telehealth: Payer: Self-pay

## 2019-01-02 NOTE — Telephone Encounter (Signed)
Reviewed medications for upcoming virtual visit w/ Dr Debara Pickett.

## 2019-01-02 NOTE — Telephone Encounter (Signed)
Home phone/ consent/ my chart/ pre reg completed °

## 2019-01-02 NOTE — Telephone Encounter (Signed)

## 2019-01-05 ENCOUNTER — Encounter: Payer: Self-pay | Admitting: Internal Medicine

## 2019-01-05 ENCOUNTER — Telehealth (INDEPENDENT_AMBULATORY_CARE_PROVIDER_SITE_OTHER): Payer: Medicare Other | Admitting: Internal Medicine

## 2019-01-05 VITALS — BP 131/67 | HR 63 | Ht 67.0 in | Wt 210.0 lb

## 2019-01-05 DIAGNOSIS — I6523 Occlusion and stenosis of bilateral carotid arteries: Secondary | ICD-10-CM

## 2019-01-05 DIAGNOSIS — R55 Syncope and collapse: Secondary | ICD-10-CM

## 2019-01-05 DIAGNOSIS — K5904 Chronic idiopathic constipation: Secondary | ICD-10-CM

## 2019-01-05 DIAGNOSIS — Z7189 Other specified counseling: Secondary | ICD-10-CM

## 2019-01-05 DIAGNOSIS — Z952 Presence of prosthetic heart valve: Secondary | ICD-10-CM | POA: Diagnosis not present

## 2019-01-05 DIAGNOSIS — I10 Essential (primary) hypertension: Secondary | ICD-10-CM

## 2019-01-05 DIAGNOSIS — E782 Mixed hyperlipidemia: Secondary | ICD-10-CM

## 2019-01-05 NOTE — Patient Instructions (Signed)
Medication Instructions:  Continue current medications If you need a refill on your cardiac medications before your next appointment, please call your pharmacy.   Lab work: Fasting lab work THIS WEEK to check cholesterol  If you have labs (blood work) drawn today and your tests are completely normal, you will receive your results only by: Marland Kitchen MyChart Message (if you have MyChart) OR . A paper copy in the mail If you have any lab test that is abnormal or we need to change your treatment, we will call you to review the results.  Testing/Procedures: None needed  Follow-Up: At [facility], you and your health needs are our priority.  As part of our continuing mission to provide you with exceptional heart care, we have created designated Provider Care Teams.  These Care Teams include your primary Cardiologist (physician) and Advanced Practice Providers (APPs -  Physician Assistants and Nurse Practitioners) who all work together to provide you with the care you need, when you need it. You will need a follow up appointment in 12 months.  Please call our office 2 months in advance to schedule this appointment.  You may see Pixie Casino, MD or one of the following Advanced Practice Providers on your designated Care Team: [place], [place] . Fabian Sharp, PA-C  Any Other Special Instructions Will Be Listed Below (If Applicable).

## 2019-01-05 NOTE — Progress Notes (Signed)
Virtual Visit via Video Note   This visit type was conducted due to national recommendations for restrictions regarding the COVID-19 Pandemic (e.g. social distancing) in an effort to limit this patient's exposure and mitigate transmission in our community.  Due to his co-morbid illnesses, this patient is at least at moderate risk for complications without adequate follow up.  This format is felt to be most appropriate for this patient at this time.  All issues noted in this document were discussed and addressed.  A limited physical exam was performed with this format.  Please refer to the patient's chart for his consent to telehealth for [facility].   Evaluation Performed:  Doximity video visit  Date:  01/05/2019   ID:  Andrew Bonilla, DOB Nov 13, 1932, MRN [id number]  Patient Location:  [address] [place] [address]  Provider location:   [address], [address] [place], [place] [address]  PCP:  Andrew Low, MD  Cardiologist:  Andrew Casino, MD Electrophysiologist:  None   Chief Complaint:  No complaints  History of Present Illness:    Andrew Bonilla is a 85 y.o. male who presents via audio/video conferencing for a telehealth visit today.  [name] was seen today for virtual visit.  Overall he is feeling well.  He denies any chest pain or worsening shortness of breath.  He had an echocardiogram in October of last year which showed a stable bioprosthetic aortic valve with a mean gradient of 10 mmHg.  He is asymptomatic with exertion.  He did have lipid testing about a year ago which showed well-controlled LDL less than 70.  He also has bilateral carotid artery disease which is mild.  He had had issues with chronic cough which has somewhat improved.  He continues to follow with Andrew Bonilla, who he saw last in January.  He is not planning to go to [place] this year due to the COVID-19 pandemic  The patient does not have symptoms concerning for COVID-19 infection (fever,  chills, cough, or new SHORTNESS OF BREATH).    Prior CV studies:   The following studies were reviewed today:  Lab work, reviewed  PMHx:  Past Medical History:  Diagnosis Date  . Arthritis   . Constipation   . Deaf, left    WEARS HEARING AID RIGHT EAR   . GERD (gastroesophageal reflux disease)   . Hard of hearing   . Heart murmur    mod-severe aortic stenosis   . Hyperlipemia   . Hypertension   . Pneumonia    in college  . Prostate enlargement   . Recurrent upper respiratory infection (URI)    12/2011  . S/P AVR (aortic valve replacement) 01/16/2012   Andrew Bonilla 13mm pericardial tissue valve    Past Surgical History:  Procedure Laterality Date  . AORTIC VALVE REPLACEMENT  01/17/2012   Procedure: AORTIC VALVE REPLACEMENT (AVR);  Surgeon: Andrew Poot, MD;  Location: [place];  Service: Open Heart Surgery;  Laterality: N/A;  . CARDIAC CATHETERIZATION  12/17/2011   mild nonobstructive CAD - 20% LAD stenosis, 30% OM stenosis, 20% Cfx, 20% RCA lesion  . JOINT REPLACEMENT     Left total knee Dr. Wynelle Bonilla 06-09-18  . LEFT HEART CATHETERIZATION WITH CORONARY ANGIOGRAM N/A 12/17/2011   Procedure: LEFT HEART CATHETERIZATION WITH CORONARY ANGIOGRAM;  Surgeon: Andrew Casino, MD;  Location: [facility] CATH LAB;  Service: Cardiovascular;  Laterality: N/A;  . TEE WITHOUT CARDIOVERSION  01/2012   EF 50-55%;  calcified AV annulus, severely calcified AV leaflets, mild regurg (AVR)  . TOTAL KNEE ARTHROPLASTY Left 06/09/2018   Procedure: LEFT TOTAL KNEE ARTHROPLASTY;  Surgeon: Andrew Arabian, MD;  Location: [facility];  Service: Orthopedics;  Laterality: Left;    FAMHx:  Family History  Problem Relation Age of Onset  . Coronary artery disease Father 71       MI/smoker  . Colon cancer Brother   . Ovarian cancer Sister   . COPD Sister     SOCHx:   reports that he has never smoked. He has never used smokeless tobacco. He reports previous alcohol use of about 1.0 - 2.0 standard drinks of  alcohol per week. He reports that he does not use drugs.  ALLERGIES:  No Known Allergies  MEDS:  Current Meds  Medication Sig  . aspirin EC 81 MG tablet Take 81 mg by mouth daily.  Marland Kitchen atorvastatin (LIPITOR) 40 MG tablet Take 40 mg by mouth daily.  Marland Kitchen BETA CAROTENE PO Take by mouth.  . Calcium-Magnesium-Zinc (CAL-MAG-ZINC PO) Take by mouth.  . cholecalciferol (VITAMIN D) 1000 units tablet Take 1,000 Units by mouth daily.  Marland Kitchen esomeprazole (NEXIUM) 40 MG capsule Take 40 mg by mouth daily at 12 noon.   . hydrocortisone cream 1 % Apply 1 application topically 2 (two) times daily as needed (rash).  . irbesartan (AVAPRO) 75 MG tablet Take 1 tablet (75 mg total) by mouth daily.  . Misc Natural Products (GLUCOSAMINE CHONDROITIN ADV) TABS Take by mouth.  . Multiple Vitamin (MULTIVITAMIN WITH MINERALS) TABS tablet Take 1 tablet by mouth daily.  . Omega-3 Fatty Acids (OMEGA-3 FISH OIL PO) Take by mouth.  Marland Kitchen OVER THE COUNTER MEDICATION Prostene - Take 1 tablet by mouth twice daily  . polyethylene glycol (MIRALAX / GLYCOLAX) packet Take 17 g by mouth 2 (two) times daily.  . Saw Palmetto, Serenoa repens, (SAW PALMETTO PO) Take by mouth.  . senna-docusate (SENOKOT-S) 8.6-50 MG tablet Take 1 tablet by mouth at bedtime.  . tadalafil (CIALIS) 10 MG tablet Take 10 mg by mouth daily as needed for erectile dysfunction.   . TURMERIC PO Take by mouth.  . vitamin E 400 UNIT capsule Take 400 Units by mouth daily.     ROS: Pertinent items noted in HPI and remainder of comprehensive ROS otherwise negative.  Labs/Other Tests and Data Reviewed:    Recent Labs: 06/12/2018: B Natriuretic Peptide 95.0 06/15/2018: ALT 47; BUN 16; Creatinine, Ser 0.68; Hemoglobin 9.7; Magnesium 2.2; Platelets 311; Potassium 4.4; Sodium 133   Recent Lipid Panel No results found for: CHOL, TRIG, HDL, CHOLHDL, LDLCALC, LDLDIRECT  Wt Readings from Last 3 Encounters:  01/05/19 210 lb (95.3 kg)  09/16/18 213 lb (96.6 kg)  06/26/18  222 lb 9.6 oz (101 kg)     Exam:    Vital Signs:  BP 131/67   Pulse 63   Ht 5\' 7"  (1.702 m)   Wt 210 lb (95.3 kg)   SpO2 98%   BMI 32.89 kg/m    General appearance: alert, no distress and mildly obese Lungs: No audible wheezing or visual respiratory difficulty Extremities: extremities normal, atraumatic, no cyanosis or edema Skin: Pale Neurologic: Mental status: Alert, oriented, thought content appropriate Psych: Pleasant  ASSESSMENT & PLAN:    1. Orthostatic syncope 2. Severe aortic stenosis status post [facility] Ed Bonilla 21 mm tissue valve in 2013 3. Hypertension 4. Dyslipidemia 5. Obesity 6. Mild to moderate bilateral carotid artery disease  Mr. Dermody continues to do  well.  His aortic valve is stable with a Bonilla mean gradient 10 mmHg.  Blood pressures well controlled.  He has had no further syncopal episodes.  He continues to be above ideal weight.  He does have mild to moderate bilateral carotid artery disease which we will continue to follow.  We will plan a repeat lipid profile to make sure he remains at goal.  He can follow-up with me annually or sooner as necessary.  COVID-19 Education: The signs and symptoms of COVID-19 were discussed with the patient and how to seek care for testing (follow up with PCP or arrange E-visit).  The importance of social distancing was discussed today.  Patient Risk:   After full review of this patients clinical status, I feel that they are at least moderate risk at this time.  Time:   Today, I have spent 25 minutes with the patient with telehealth technology discussing syncope, dyslipidemia, hypertension, aortic valve disease, carotid artery disease, weight loss.     Medication Adjustments/Labs and Tests Ordered: Current medicines are reviewed at length with the patient today.  Concerns regarding medicines are outlined above.   Tests Ordered: Orders Placed This Encounter  Procedures  . Lipid panel    Medication Changes: No  orders of the defined types were placed in this encounter.   Disposition:  in 1 year(s)  Andrew Casino, MD, [facility] [place] Director of the Advanced Lipid Disorders &  Cardiovascular Risk Reduction Clinic Diplomate of the American Board of Clinical Lipidology Attending Cardiologist  Direct Dial: [phone]  Fax: [phone]  Website:  [email or web address]  Andrew Casino, MD  01/05/2019 10:59 AM

## 2019-01-08 LAB — LIPID PANEL
Chol/HDL Ratio: 2.1 ratio (ref 0.0–5.0)
Cholesterol, Total: 146 mg/dL (ref 100–199)
HDL: 68 mg/dL (ref >39)
LDL Calculated: 65 mg/dL (ref 0–99)
Triglycerides: 64 mg/dL (ref 0–149)
VLDL Cholesterol Cal: 13 mg/dL (ref 5–40)

## 2019-01-19 ENCOUNTER — Other Ambulatory Visit: Payer: Self-pay | Admitting: Internal Medicine

## 2019-01-19 DIAGNOSIS — I6523 Occlusion and stenosis of bilateral carotid arteries: Secondary | ICD-10-CM

## 2019-02-05 ENCOUNTER — Other Ambulatory Visit: Payer: Self-pay

## 2019-02-05 ENCOUNTER — Ambulatory Visit (HOSPITAL_COMMUNITY)
Admission: RE | Admit: 2019-02-05 | Discharge: 2019-02-05 | Disposition: A | Discharge status: Home / Self Care | Payer: Medicare Other | Source: Ambulatory Visit | Attending: Cardiology | Admitting: Cardiology

## 2019-02-05 ENCOUNTER — Other Ambulatory Visit (HOSPITAL_COMMUNITY): Payer: Self-pay | Admitting: Internal Medicine

## 2019-02-05 DIAGNOSIS — I6523 Occlusion and stenosis of bilateral carotid arteries: Secondary | ICD-10-CM

## 2019-02-11 ENCOUNTER — Other Ambulatory Visit: Payer: Self-pay | Admitting: *Deleted

## 2019-02-11 DIAGNOSIS — I6523 Occlusion and stenosis of bilateral carotid arteries: Secondary | ICD-10-CM

## 2019-02-16 ENCOUNTER — Encounter: Payer: Self-pay | Admitting: Internal Medicine

## 2019-03-06 ENCOUNTER — Other Ambulatory Visit: Payer: Self-pay | Admitting: Internal Medicine

## 2019-03-17 ENCOUNTER — Telehealth: Payer: Self-pay | Admitting: Internal Medicine

## 2019-03-17 NOTE — Telephone Encounter (Signed)
Acceptable risk for surgery - ok to hold aspirin 5 days prior to surgery if necessary.  DR. Lemmie Evens

## 2019-03-17 NOTE — Telephone Encounter (Signed)
Dr. Debara Pickett you saw Andrew Bonilla 01/2019 telehealth  He needs rt total knee arthroplasty, OK to hold ASA for 5 days before ?  Please respond to pre-op pool thanks.

## 2019-03-17 NOTE — Telephone Encounter (Signed)
   [facility] Pre-operative Risk Assessment    Request for surgical clearance:  1. What type of surgery is being performed? Right total knee arthroplasty   2. When is this surgery scheduled? 06/22/2019   3. What type of clearance is required (medical clearance vs. Pharmacy clearance to hold med vs. Both)? Both   4. Are there any medications that need to be held prior to surgery and how long? ASA   5. Practice name and name of physician performing surgery? Hector Shade, MD with [facility]  6. What is your office phone number [phone]   7.   What is your office fax number [phone] attn: Glendale Chard  8.   Anesthesia type (None, local, MAC, general) ? Choice    Sheral Apley M 03/17/2019, 8:07 AM  _________________________________________________________________   (provider comments below)

## 2019-03-19 NOTE — Telephone Encounter (Signed)
   Primary Cardiologist: Pixie Casino, MD  Chart reviewed as part of pre-operative protocol coverage. Given past medical history and time since last visit, based on ACC/AHA guidelines, Andrew Bonilla would be at acceptable risk for the planned procedure without further cardiovascular testing.   OK to hold aspirin 5 days pre op if needed.  I will route this recommendation to the requesting party via Epic fax function and remove from pre-op pool.  Please call with questions.  Kerin Ransom, PA-C 03/19/2019, 8:33 AM

## 2019-04-23 ENCOUNTER — Ambulatory Visit: Payer: Medicare Other | Admitting: Internal Medicine

## 2019-06-10 ENCOUNTER — Ambulatory Visit: Payer: Medicare Other | Admitting: Cardiothoracic Surgery

## 2019-06-10 ENCOUNTER — Other Ambulatory Visit: Payer: Self-pay

## 2019-06-10 ENCOUNTER — Encounter: Payer: Self-pay | Admitting: Cardiothoracic Surgery

## 2019-06-10 VITALS — BP 146/72 | HR 72 | Temp 96.3°F | Resp 16 | Ht 67.0 in | Wt 210.0 lb

## 2019-06-10 DIAGNOSIS — Z952 Presence of prosthetic heart valve: Secondary | ICD-10-CM

## 2019-06-10 NOTE — Progress Notes (Signed)
PCP is Wenda Low, MD Referring Provider is Debara Pickett Nadean Corwin, MD  Chief Complaint  Patient presents with  . Routine Post Op    9 month f/u ...h/o AVR 01/17/12    HPI: Patient returns for scheduled follow-up almost 7 years after aortic valve replacement for aortic stenosis.  He has normal LV function.  His last echocardiogram showed the aortic valve prosthesis to be functioning well with minimal gradient and no leak.  He maintains  Sinus rhythm.  He denies chest pain shortness of breath or edema.  Patient is scheduled for right knee replacement by Dr. Jeffie Pollock on October 19.  His cardiac function is normal. Recent carotid ultrasound showed no significant stenosis.  Past Medical History:  Diagnosis Date  . Arthritis   . Constipation   . Deaf, left    WEARS HEARING AID RIGHT EAR   . GERD (gastroesophageal reflux disease)   . Hard of hearing   . Heart murmur    mod-severe aortic stenosis   . Hyperlipemia   . Hypertension   . Pneumonia    in college  . Prostate enlargement   . Recurrent upper respiratory infection (URI)    12/2011  . S/P AVR (aortic valve replacement) 01/16/2012   [facility] 73mm pericardial tissue valve    Past Surgical History:  Procedure Laterality Date  . AORTIC VALVE REPLACEMENT  01/17/2012   Procedure: AORTIC VALVE REPLACEMENT (AVR);  Surgeon: Ivin Poot, MD;  Location: [place];  Service: Open Heart Surgery;  Laterality: N/A;  . CARDIAC CATHETERIZATION  12/17/2011   mild nonobstructive CAD - 20% LAD stenosis, 30% OM stenosis, 20% Cfx, 20% RCA lesion  . JOINT REPLACEMENT     Left total knee Dr. Wynelle Link 06-09-18  . LEFT HEART CATHETERIZATION WITH CORONARY ANGIOGRAM N/A 12/17/2011   Procedure: LEFT HEART CATHETERIZATION WITH CORONARY ANGIOGRAM;  Surgeon: Pixie Casino, MD;  Location: [facility] CATH LAB;  Service: Cardiovascular;  Laterality: N/A;  . TEE WITHOUT CARDIOVERSION  01/2012   EF 50-55%; calcified AV annulus, severely calcified AV leaflets, mild  regurg (AVR)  . TOTAL KNEE ARTHROPLASTY Left 06/09/2018   Procedure: LEFT TOTAL KNEE ARTHROPLASTY;  Surgeon: Gaynelle Arabian, MD;  Location: [facility];  Service: Orthopedics;  Laterality: Left;    Family History  Problem Relation Age of Onset  . Coronary artery disease Father 31       MI/smoker  . Colon cancer Brother   . Ovarian cancer Sister   . COPD Sister     Social History Social History   Tobacco Use  . Smoking status: Never Smoker  . Smokeless tobacco: Never Used  Substance Use Topics  . Alcohol use: Not Currently    Alcohol/week: 1.0 - 2.0 standard drinks    Types: 1 - 2 Standard drinks or equivalent per week    Comment: SOCIAL   . Drug use: Never    Current Outpatient Medications  Medication Sig Dispense Refill  . aspirin EC 81 MG tablet Take 81 mg by mouth daily.    Marland Kitchen atorvastatin (LIPITOR) 40 MG tablet Take 40 mg by mouth daily.    Marland Kitchen BETA CAROTENE PO Take by mouth.    . Calcium-Magnesium-Zinc (CAL-MAG-ZINC PO) Take by mouth.    . cholecalciferol (VITAMIN D) 1000 units tablet Take 1,000 Units by mouth daily.    Marland Kitchen esomeprazole (NEXIUM) 40 MG capsule Take 40 mg by mouth daily at 12 noon.     . hydrocortisone cream 1 % Apply 1 application  topically 2 (two) times daily as needed (rash).    . irbesartan (AVAPRO) 75 MG tablet TAKE 1 TABLET BY MOUTH EVERY DAY 90 tablet 2  . Misc Natural Products (GLUCOSAMINE CHONDROITIN ADV) TABS Take by mouth.    . Multiple Vitamin (MULTIVITAMIN WITH MINERALS) TABS tablet Take 1 tablet by mouth daily.    . Omega-3 Fatty Acids (OMEGA-3 FISH OIL PO) Take by mouth.    Marland Kitchen OVER THE COUNTER MEDICATION Prostene - Take 1 tablet by mouth twice daily    . polyethylene glycol (MIRALAX / GLYCOLAX) packet Take 17 g by mouth 2 (two) times daily. 14 each 0  . Saw Palmetto, Serenoa repens, (SAW PALMETTO PO) Take by mouth.    . senna-docusate (SENOKOT-S) 8.6-50 MG tablet Take 1 tablet by mouth at bedtime. 30 tablet 0  . tadalafil (CIALIS) 10 MG tablet Take  10 mg by mouth daily as needed for erectile dysfunction.     . TURMERIC PO Take by mouth.    . vitamin E 400 UNIT capsule Take 400 Units by mouth daily.     No current facility-administered medications for this visit.     No Known Allergies  Review of Systems  Weight stable No fever Difficulty walking because of severe arthritis right knee No palpitations No presyncope No abdominal pain No chest pain  BP (!) 146/72 (BP Location: Right Arm, Patient Position: Sitting, Cuff Size: Normal)   Pulse 72   Temp (!) 96.3 F (35.7 C)   Resp 16   Ht 5\' 7"  (1.702 m)   Wt 210 lb (95.3 kg)   SpO2 98% Comment: RA  BMI 32.89 kg/m  Physical Exam      Exam    General- alert and comfortable    Neck- no JVD, no cervical adenopathy palpable, no carotid bruit   Lungs- clear without rales, wheezes   Cor- regular rate and rhythm, no murmur , gallop   Abdomen- soft, non-tender   Extremities - warm, non-tender, minimal edema   Neuro- oriented, appropriate, no focal weakness  Diagnostic Tests: None  Impression: Stable aortic valve function 7 years after AVR for aortic stenosis Normal LV function by last echo Maintaining sinus rhythm Plan: Patient's cardiac status suitable for total knee replacement is scheduled on October 19.  He will stop the omega-3 fish oil tablets temporarily before the surgery.  Len Childs, MD [facility] [phone]

## 2019-06-11 NOTE — Progress Notes (Signed)
PCP - Wenda Low  lov note and clearance 04-01-19 on chart Cardiologist - [facility] Clearance epic note 03-19-19 Kerin Ransom PA-C epic. LOV Dr. Darcey Nora 06-10-19 clearance epic  Chest x-ray - 07-04-18 epic EKG -  Stress Test -  ECHO - 06-14-18 epic Cardiac Cath -   Sleep Study -  CPAP -   Fasting Blood Sugar -  Checks Blood Sugar _____ times a day  Blood Thinner Instructions: Aspirin Instructions:  Hold 5 days if needed per cardiology note Last Dose:  Anesthesia review: aortic stenosis with AVR  01-17-12 , abn ekg  To Konrad Felix Pa-C for review  Patient denies shortness of breath, fever, cough and chest pain at PAT appointment   NONE   Patient verbalized understanding of instructions that were given to them at the PAT appointment. Patient was also instructed that they will need to review over the PAT instructions again at home before surgery.

## 2019-06-11 NOTE — Patient Instructions (Signed)
DUE TO COVID-19 ONLY ONE VISITOR IS ALLOWED TO COME WITH YOU AND STAY IN THE WAITING ROOM ONLY DURING PRE OP AND PROCEDURE DAY OF SURGERY. THE 1 VISITOR MAY VISIT WITH YOU AFTER SURGERY IN YOUR PRIVATE ROOM DURING VISITING HOURS ONLY!  YOU NEED TO HAVE A COVID 19 TEST ON_______ @_______ , THIS TEST MUST BE DONE BEFORE SURGERY, COME  [address] , [address].  ([place]) ONCE YOUR COVID TEST IS COMPLETED, PLEASE BEGIN THE QUARANTINE INSTRUCTIONS AS OUTLINED IN YOUR HANDOUT.                HAARIS PARIS  06/11/2019   Your procedure is scheduled on: 06-22-19   Report to [facility] Main  Entrance   Report to admitting at       0550 AM     Call this number if you have problems the morning of surgery [phone]    Remember: NO SOLID FOOD AFTER MIDNIGHT THE NIGHT PRIOR TO SURGERY. NOTHING BY MOUTH EXCEPT CLEAR LIQUIDS UNTIL 0520 am  . PLEASE FINISH ENSURE DRINK PER SURGEON ORDER  WHICH NEEDS TO BE COMPLETED AT   0520 am then nothing by mouth .    CLEAR LIQUID DIET   Foods Allowed                                                                     Foods Excluded  Coffee and tea, regular and decaf                             liquids that you cannot  Plain Jell-O any favor except red or purple                                           see through such as: Fruit ices (not with fruit pulp)                                     milk, soups, orange juice  Iced Popsicles                                    All solid food Carbonated beverages, regular and diet                                    Cranberry, grape and apple juices Sports drinks like Gatorade Lightly seasoned clear broth or consume(fat free) Sugar, honey syrup   ____________________________________________________________________    BRUSH YOUR TEETH MORNING OF SURGERY AND RINSE YOUR MOUTH OUT, NO CHEWING GUM CANDY OR MINTS.     Take these medicines the morning of surgery with A SIP OF  WATER: nexium  You may not have any metal on your body including hair pins and              piercings  Do not wear jewelry,  lotions, powders or perfumes, deodorant            .              Men may shave face and neck.   Do not bring valuables to the hospital. [place].  Contacts, dentures or bridgework may not be worn into surgery.                 Please read over the following fact sheets you were given: ________________________________________________________________          [facility] - Preparing for Surgery Before surgery, you can play an important role.  Because skin is not sterile, your skin needs to be as free of germs as possible.  You can reduce the number of germs on your skin by washing with CHG (chlorahexidine gluconate) soap before surgery.  CHG is an antiseptic cleaner which kills germs and bonds with the skin to continue killing germs even after washing. Please DO NOT use if you have an allergy to CHG or antibacterial soaps.  If your skin becomes reddened/irritated stop using the CHG and inform your nurse when you arrive at Short Stay. Do not shave (including legs and underarms) for at least 48 hours prior to the first CHG shower.  You may shave your face/neck. Please follow these instructions carefully:  1.  Shower with CHG Soap the night before surgery and the  morning of Surgery.  2.  If you choose to wash your hair, wash your hair first as usual with your  normal  shampoo.  3.  After you shampoo, rinse your hair and body thoroughly to remove the  shampoo.                           4.  Use CHG as you would any other liquid soap.  You can apply chg directly  to the skin and wash                       Gently with a scrungie or clean washcloth.  5.  Apply the CHG Soap to your body ONLY FROM THE NECK DOWN.   Do not use on face/ open                           Wound or open sores. Avoid  contact with eyes, ears mouth and genitals (private parts).                       Wash face,  Genitals (private parts) with your normal soap.             6.  Wash thoroughly, paying special attention to the area where your surgery  will be performed.  7.  Thoroughly rinse your body with warm water from the neck down.  8.  DO NOT shower/wash with your normal soap after using and rinsing off  the CHG Soap.                9.  Pat yourself dry with a clean towel.  10.  Wear clean pajamas.            11.  Place clean sheets on your bed the night of your first shower and do not  sleep with pets. Day of Surgery : Do not apply any lotions/deodorants the morning of surgery.  Please wear clean clothes to the hospital/surgery center.  FAILURE TO FOLLOW THESE INSTRUCTIONS MAY RESULT IN THE CANCELLATION OF YOUR SURGERY PATIENT SIGNATURE_________________________________  NURSE SIGNATURE__________________________________  ________________________________________________________________________   Adam Phenix  An incentive spirometer is a tool that can help keep your lungs clear and active. This tool measures how well you are filling your lungs with each breath. Taking long deep breaths may help reverse or decrease the chance of developing breathing (pulmonary) problems (especially infection) following:  A long period of time when you are unable to move or be active. BEFORE THE PROCEDURE   If the spirometer includes an indicator to show your best effort, your nurse or respiratory therapist will set it to a desired goal.  If possible, sit up straight or lean slightly forward. Try not to slouch.  Hold the incentive spirometer in an upright position. INSTRUCTIONS FOR USE  1. Sit on the edge of your bed if possible, or sit up as far as you can in bed or on a chair. 2. Hold the incentive spirometer in an upright position. 3. Breathe out normally. 4. Place the mouthpiece in your mouth  and seal your lips tightly around it. 5. Breathe in slowly and as deeply as possible, raising the piston or the ball toward the top of the column. 6. Hold your breath for 3-5 seconds or for as long as possible. Allow the piston or ball to fall to the bottom of the column. 7. Remove the mouthpiece from your mouth and breathe out normally. 8. Rest for a few seconds and repeat Steps 1 through 7 at least 10 times every 1-2 hours when you are awake. Take your time and take a few normal breaths between deep breaths. 9. The spirometer may include an indicator to show your best effort. Use the indicator as a goal to work toward during each repetition. 10. After each set of 10 deep breaths, practice coughing to be sure your lungs are clear. If you have an incision (the cut made at the time of surgery), support your incision when coughing by placing a pillow or rolled up towels firmly against it. Once you are able to get out of bed, walk around indoors and cough well. You may stop using the incentive spirometer when instructed by your caregiver.  RISKS AND COMPLICATIONS  Take your time so you do not get dizzy or light-headed.  If you are in pain, you may need to take or ask for pain medication before doing incentive spirometry. It is harder to take a deep breath if you are having pain. AFTER USE  Rest and breathe slowly and easily.  It can be helpful to keep track of a log of your progress. Your caregiver can provide you with a simple table to help with this. If you are using the spirometer at home, follow these instructions: [place] IF:   You are having difficultly using the spirometer.  You have trouble using the spirometer as often as instructed.  Your pain medication is not giving enough relief while using the spirometer.  You develop fever of 100.5 F (38.1 C) or higher. SEEK IMMEDIATE MEDICAL CARE IF:   You cough up bloody sputum  that had not been present before.  You develop  fever of 102 F (38.9 C) or greater.  You develop worsening pain at or near the incision site. MAKE SURE YOU:   Understand these instructions.  Will watch your condition.  Will get help right away if you are not doing well or get worse. Document Released: 12/31/2006 Document Revised: 11/12/2011 Document Reviewed: 03/03/2007 ExitCare Patient Information 2014 Exit[employer], [place].   ________________________________________________________________________  WHAT IS A BLOOD TRANSFUSION? Blood Transfusion Information  A transfusion is the replacement of blood or some of its parts. Blood is made up of multiple cells which provide different functions.  Red blood cells carry oxygen and are used for blood loss replacement.  White blood cells fight against infection.  Platelets control bleeding.  Plasma helps clot blood.  Other blood products are available for specialized needs, such as hemophilia or other clotting disorders. BEFORE THE TRANSFUSION  Who gives blood for transfusions?   Healthy volunteers who are fully evaluated to make sure their blood is safe. This is blood bank blood. Transfusion therapy is the safest it has ever been in the practice of medicine. Before blood is taken from a donor, a complete history is taken to make sure that person has no history of diseases nor engages in risky social behavior (examples are intravenous drug use or sexual activity with multiple partners). The donor's travel history is screened to minimize risk of transmitting infections, such as malaria. The donated blood is tested for signs of infectious diseases, such as HIV and hepatitis. The blood is then tested to be sure it is compatible with you in order to minimize the chance of a transfusion reaction. If you or a relative donates blood, this is often done in anticipation of surgery and is not appropriate for emergency situations. It takes many days to process the donated blood. RISKS AND  COMPLICATIONS Although transfusion therapy is very safe and saves many lives, the main dangers of transfusion include:   Getting an infectious disease.  Developing a transfusion reaction. This is an allergic reaction to something in the blood you were given. Every precaution is taken to prevent this. The decision to have a blood transfusion has been considered carefully by your caregiver before blood is given. Blood is not given unless the benefits outweigh the risks. AFTER THE TRANSFUSION  Right after receiving a blood transfusion, you will usually feel much better and more energetic. This is especially true if your red blood cells have gotten low (anemic). The transfusion raises the level of the red blood cells which carry oxygen, and this usually causes an energy increase.  The nurse administering the transfusion will monitor you carefully for complications. HOME CARE INSTRUCTIONS  No special instructions are needed after a transfusion. You may find your energy is better. Speak with your caregiver about any limitations on activity for underlying diseases you may have. SEEK MEDICAL CARE IF:   Your condition is not improving after your transfusion.  You develop redness or irritation at the intravenous (IV) site. SEEK IMMEDIATE MEDICAL CARE IF:  Any of the following symptoms occur over the next 12 hours:  Shaking chills.  You have a temperature by mouth above 102 F (38.9 C), not controlled by medicine.  Chest, back, or muscle pain.  People around you feel you are not acting correctly or are confused.  Shortness of breath or difficulty breathing.  Dizziness and fainting.  You get a rash or develop hives.  You have a decrease  in urine output.  Your urine turns a dark color or changes to pink, red, or brown. Any of the following symptoms occur over the next 10 days:  You have a temperature by mouth above 102 F (38.9 C), not controlled by medicine.  Shortness of  breath.  Weakness after normal activity.  The white part of the eye turns yellow (jaundice).  You have a decrease in the amount of urine or are urinating less often.  Your urine turns a dark color or changes to pink, red, or brown. Document Released: 08/17/2000 Document Revised: 11/12/2011 Document Reviewed: 04/05/2008 [facility] Patient Information 2014 [place], [place].  _______________________________________________________________________

## 2019-06-15 ENCOUNTER — Encounter (HOSPITAL_COMMUNITY)
Admission: RE | Admit: 2019-06-15 | Discharge: 2019-06-15 | Disposition: A | Discharge status: Home / Self Care | Payer: Medicare Other | Source: Ambulatory Visit | Attending: Orthopedic Surgery | Admitting: Orthopedic Surgery

## 2019-06-15 ENCOUNTER — Encounter (HOSPITAL_COMMUNITY): Payer: Self-pay

## 2019-06-15 ENCOUNTER — Other Ambulatory Visit: Payer: Self-pay

## 2019-06-15 DIAGNOSIS — Z79899 Other long term (current) drug therapy: Secondary | ICD-10-CM | POA: Insufficient documentation

## 2019-06-15 DIAGNOSIS — M1711 Unilateral primary osteoarthritis, right knee: Secondary | ICD-10-CM | POA: Insufficient documentation

## 2019-06-15 DIAGNOSIS — I10 Essential (primary) hypertension: Secondary | ICD-10-CM | POA: Diagnosis not present

## 2019-06-15 DIAGNOSIS — Z7982 Long term (current) use of aspirin: Secondary | ICD-10-CM | POA: Diagnosis not present

## 2019-06-15 DIAGNOSIS — Z01818 Encounter for other preprocedural examination: Secondary | ICD-10-CM | POA: Diagnosis not present

## 2019-06-15 DIAGNOSIS — E785 Hyperlipidemia, unspecified: Secondary | ICD-10-CM | POA: Diagnosis not present

## 2019-06-15 LAB — CBC
HCT: 41.7 % (ref 39.0–52.0)
Hemoglobin: 13.8 g/dL (ref 13.0–17.0)
MCH: 30.5 pg (ref 26.0–34.0)
MCHC: 33.1 g/dL (ref 30.0–36.0)
MCV: 92.1 fL (ref 80.0–100.0)
Platelets: 263 10*3/uL (ref 150–400)
RBC: 4.53 MIL/uL (ref 4.22–5.81)
RDW: 12.9 % (ref 11.5–15.5)
WBC: 6.7 10*3/uL (ref 4.0–10.5)
nRBC: 0 % (ref 0.0–0.2)

## 2019-06-15 LAB — COMPREHENSIVE METABOLIC PANEL
ALT: 22 U/L (ref 0–44)
AST: 26 U/L (ref 15–41)
Albumin: 4.4 g/dL (ref 3.5–5.0)
Alkaline Phosphatase: 53 U/L (ref 38–126)
Anion gap: 8 (ref 5–15)
BUN: 18 mg/dL (ref 8–23)
CO2: 27 mmol/L (ref 22–32)
Calcium: 9.3 mg/dL (ref 8.9–10.3)
Chloride: 99 mmol/L (ref 98–111)
Creatinine, Ser: 0.81 mg/dL (ref 0.61–1.24)
GFR calc Af Amer: >60 mL/min (ref >60)
GFR calc non Af Amer: >60 mL/min (ref >60)
Glucose, Bld: 120 mg/dL — ABNORMAL HIGH (ref 70–99)
Potassium: 4.8 mmol/L (ref 3.5–5.1)
Sodium: 134 mmol/L — ABNORMAL LOW (ref 135–145)
Total Bilirubin: 1.2 mg/dL (ref 0.3–1.2)
Total Protein: 7.4 g/dL (ref 6.5–8.1)

## 2019-06-15 LAB — SURGICAL PCR SCREEN
MRSA, PCR: NEGATIVE
Staphylococcus aureus: NEGATIVE

## 2019-06-15 LAB — PROTIME-INR
INR: 1.1 (ref 0.8–1.2)
Prothrombin Time: 13.6 seconds (ref 11.4–15.2)

## 2019-06-15 LAB — APTT: aPTT: 33 seconds (ref 24–36)

## 2019-06-17 NOTE — Anesthesia Preprocedure Evaluation (Addendum)
Anesthesia Evaluation  Patient identified by MRN, date of birth, ID band Patient awake    Reviewed: Allergy & Precautions, NPO status , Patient's Chart, lab work & pertinent test results  Airway Mallampati: III  TM Distance: >3 FB Neck ROM: Full    Dental no notable dental hx.    Pulmonary neg pulmonary ROS,    Pulmonary exam normal breath sounds clear to auscultation       Cardiovascular hypertension, Pt. on medications Normal cardiovascular exam Rhythm:Regular Rate:Normal  ECG: NSR, rate 69   Neuro/Psych negative neurological ROS     GI/Hepatic Neg liver ROS, GERD  Medicated and Controlled,  Endo/Other  negative endocrine ROS  Renal/GU negative Renal ROS     Musculoskeletal  (+) Arthritis , Osteoarthritis,    Abdominal (+) + obese,   Peds  Hematology HLD   Anesthesia Other Findings right knee osteoarthritis  Reproductive/Obstetrics                            Anesthesia Physical Anesthesia Plan  ASA: II  Anesthesia Plan: Regional and Spinal   Post-op Pain Management:    Induction: Intravenous  PONV Risk Score and Plan: 1 and Ondansetron, Dexamethasone and Treatment may vary due to age or medical condition  Airway Management Planned: Simple Face Mask  Additional Equipment:   Intra-op Plan:   Post-operative Plan:   Informed Consent: I have reviewed the patients History and Physical, chart, labs and discussed the procedure including the risks, benefits and alternatives for the proposed anesthesia with the patient or authorized representative who has indicated his/her understanding and acceptance.     Dental advisory given  Plan Discussed with: CRNA  Anesthesia Plan Comments: (Reviewed PAT note 06/15/2019, Konrad Felix, PA-C)       Anesthesia Quick Evaluation

## 2019-06-17 NOTE — Progress Notes (Signed)
Anesthesia Chart Review   Case: [id number] Date/Time: 06/22/19 0805   Procedure: TOTAL KNEE ARTHROPLASTY (Right ) - 66min   Anesthesia type: Choice   Pre-op diagnosis: right knee osteoarthritis   Location: [facility] ROOM 10 / [facility]   Surgeon: Gaynelle Arabian, MD      DISCUSSION:86 y.o. never smoker with HTN, GERD, s/p AVR 01/16/2012, right knee OA scheduled for above procedure 06/22/2019 with Dr. Gaynelle Arabian.   Pt last seen by vascular surgeon, Dr. Prescott Gum, 06/10/2019.  Per OV note, "Stable aortic valve function 7 years after AVR for aortic stenosis Normal LV function by last echo Maintaining sinus rhythm Plan: Patient's cardiac status suitable for total knee replacement is scheduled on October 19.  He will stop the omega-3 fish oil tablets temporarily before the surgery"  Cleared by cardiology 03/19/2019.  Per Kerin Ransom, PA-C, "Given past medical history and time since last visit, based on ACC/AHA guidelines, AQEEL RELYEA would be at acceptable risk for the planned procedure without further cardiovascular testing. OK to hold aspirin 5 days pre op if needed."  Anticipate pt can proceed with planned procedure barring acute status change.   VS: BP (!) 153/60 (BP Location: Right Arm)   Pulse 78   Temp 37 C (Oral)   Resp 18   Ht 5\' 7"  (1.702 m)   Wt 97.2 kg   SpO2 100%   BMI 33.57 kg/m   PROVIDERS: Wenda Low, MD is PCP clearance on chart   Ivin Poot, MD is Vascular Surgeon  Lyman Bishop, MD is Cardiologist  LABS: Labs reviewed: Acceptable for surgery. (all labs ordered are listed, but only abnormal results are displayed)  Labs Reviewed  COMPREHENSIVE METABOLIC PANEL - Abnormal; Notable for the following components:      Result Value   Sodium 134 (*)    Glucose, Bld 120 (*)    All other components within normal limits  SURGICAL PCR SCREEN  APTT  CBC  PROTIME-INR  TYPE AND SCREEN     IMAGES:   EKG: 06/23/2019 Rate 69 bpm Normal sinus  rhythm Nonspecific ST and T wave abnormality Abnormal ECG Since last tracing lateral ST changes are slightly more pronounced  CV: Echo 06/22/18 Study Conclusions  - Left ventricle: The cavity size was normal. Wall thickness was   increased in a pattern of moderate LVH. Systolic function was   normal. The estimated ejection fraction was in the range of 60%   to 65%. Wall motion was normal; there were no regional wall   motion abnormalities. Doppler parameters are consistent with   abnormal left ventricular relaxation (grade 1 diastolic   dysfunction). The E/e&' ratio is between 8-15, suggesting   indeterminate LV filling pressure. - Aortic valve: Bioprosthetic valve (21 mm Edwards Magna-Ease). No   obstruction. Mean gradient (S): 10 mm Hg. Peak gradient (S): 19   mm Hg. Valve area (VTI): 1.7 cm^2. Valve area (Vmax): 1.75 cm^2.   Valve area (Vmean): 1.69 cm^2. - Mitral valve: Calcified annulus. Mildly thickened leaflets .   There was trivial regurgitation. - Left atrium: The atrium was normal in size. - Right atrium: The atrium was mildly dilated. - Tricuspid valve: There was trivial regurgitation. - Pulmonary arteries: PA peak pressure: 26 mm Hg (S). - Inferior vena cava: The vessel was normal in size. The   respirophasic diameter changes were in the normal range (>= 50%),   consistent with normal central venous pressure.  Impressions:  - Compared to a prior study  in 07/2017, there have been no   significant changes. Past Medical History:  Diagnosis Date  . Arthritis   . Constipation   . Deaf, left    WEARS HEARING AID RIGHT EAR   . GERD (gastroesophageal reflux disease)   . Hard of hearing   . Heart murmur    mod-severe aortic stenosis   . Hyperlipemia   . Hypertension   . Pneumonia    in college  . Prostate enlargement   . Recurrent upper respiratory infection (URI)    12/2011  . S/P AVR (aortic valve replacement) 01/16/2012   [facility] Ease 32mm  pericardial tissue valve    Past Surgical History:  Procedure Laterality Date  . AORTIC VALVE REPLACEMENT  01/17/2012   Procedure: AORTIC VALVE REPLACEMENT (AVR);  Surgeon: Ivin Poot, MD;  Location: [facility];  Service: Open Heart Surgery;  Laterality: N/A;  . CARDIAC CATHETERIZATION  12/17/2011   mild nonobstructive CAD - 20% LAD stenosis, 30% OM stenosis, 20% Cfx, 20% RCA lesion  . JOINT REPLACEMENT     Left total knee Dr. Wynelle Link 06-09-18  . LEFT HEART CATHETERIZATION WITH CORONARY ANGIOGRAM N/A 12/17/2011   Procedure: LEFT HEART CATHETERIZATION WITH CORONARY ANGIOGRAM;  Surgeon: Pixie Casino, MD;  Location: [facility] CATH LAB;  Service: Cardiovascular;  Laterality: N/A;  . TEE WITHOUT CARDIOVERSION  01/2012   EF 50-55%; calcified AV annulus, severely calcified AV leaflets, mild regurg (AVR)  . TOTAL KNEE ARTHROPLASTY Left 06/09/2018   Procedure: LEFT TOTAL KNEE ARTHROPLASTY;  Surgeon: Gaynelle Arabian, MD;  Location[facility];  Service: Orthopedics;  Laterality: Left;    MEDICATIONS: . aspirin EC 81 MG tablet  . atorvastatin (LIPITOR) 40 MG tablet  . BETA CAROTENE PO  . Calcium-Magnesium-Zinc (CAL-MAG-ZINC PO)  . esomeprazole (NEXIUM) 40 MG capsule  . hydrocortisone cream 1 %  . irbesartan (AVAPRO) 75 MG tablet  . Misc Natural Products (GLUCOSAMINE CHONDROITIN ADV) TABS  . Multiple Vitamin (MULTIVITAMIN WITH MINERALS) TABS tablet  . Omega-3 Fatty Acids (OMEGA-3 FISH OIL PO)  . polyethylene glycol (MIRALAX / GLYCOLAX) packet  . Saw Palmetto, Serenoa repens, (SAW PALMETTO PO)  . senna-docusate (SENOKOT-S) 8.6-50 MG tablet  . tadalafil (CIALIS) 10 MG tablet  . TURMERIC PO  . vitamin E 400 UNIT capsule   No current facility-administered medications for this encounter.      Maia Plan [facility] Pre-Surgical Testing [phone] 06/17/19  10:52 AM

## 2019-06-18 ENCOUNTER — Other Ambulatory Visit (HOSPITAL_COMMUNITY)
Admission: RE | Admit: 2019-06-18 | Discharge: 2019-06-18 | Disposition: A | Discharge status: Home / Self Care | Payer: Medicare Other | Source: Ambulatory Visit | Attending: Orthopedic Surgery | Admitting: Orthopedic Surgery

## 2019-06-18 DIAGNOSIS — Z01812 Encounter for preprocedural laboratory examination: Secondary | ICD-10-CM | POA: Insufficient documentation

## 2019-06-18 DIAGNOSIS — Z20828 Contact with and (suspected) exposure to other viral communicable diseases: Secondary | ICD-10-CM | POA: Diagnosis not present

## 2019-06-18 NOTE — H&P (Signed)
TOTAL KNEE ADMISSION H&P  Patient is being admitted for right total knee arthroplasty.  Subjective:  Chief Complaint:right knee pain.  HPI: ANDERSSON SCHLOTTER, 85 y.o. male, has a history of pain and functional disability in the right knee due to arthritis and has failed non-surgical conservative treatments for greater than 12 weeks to includeNSAID's and/or analgesics, corticosteriod injections, viscosupplementation injections, flexibility and strengthening excercises and activity modification.  Onset of symptoms was gradual, starting >10 years ago with gradually worsening course since that time. The patient noted no past surgery on the right knee(s).  Patient currently rates pain in the right knee(s) at 7 out of 10 with activity. Patient has night pain, worsening of pain with activity and weight bearing, pain that interferes with activities of daily living, pain with passive range of motion, crepitus and joint swelling.  Patient has evidence of periarticular osteophytes and joint space narrowing by imaging studies. There is no active infection.  Patient Active Problem List   Diagnosis Date Noted  . Syncope 06/13/2018  . Normocytic anemia 06/13/2018  . Hyponatremia 06/13/2018  . Osteoarthritis of knee 06/10/2018  . OA (osteoarthritis) of knee 06/09/2018  . Bilateral carotid artery stenosis 11/30/2016  . Chest wall pain 09/13/2015  . Obesity (BMI 35.0-39.9 without comorbidity) 08/04/2014  . Cough 08/21/2013  . Near syncope 08/21/2013  . Bradycardia 08/21/2013  . Essential hypertension 12/15/2011  . Mixed hyperlipidemia 12/15/2011  . Constipation 12/15/2011  . S/P AVR (aortic valve replacement) 12/15/2011   Past Medical History:  Diagnosis Date  . Arthritis   . Constipation   . Deaf, left    WEARS HEARING AID RIGHT EAR   . GERD (gastroesophageal reflux disease)   . Hard of hearing   . Heart murmur    mod-severe aortic stenosis   . Hyperlipemia   . Hypertension   . Pneumonia    in  college  . Prostate enlargement   . Recurrent upper respiratory infection (URI)    12/2011  . S/P AVR (aortic valve replacement) 01/16/2012   [facility] Ease 66mm pericardial tissue valve    Past Surgical History:  Procedure Laterality Date  . AORTIC VALVE REPLACEMENT  01/17/2012   Procedure: AORTIC VALVE REPLACEMENT (AVR);  Surgeon: Ivin Poot, MD;  Location: [place];  Service: Open Heart Surgery;  Laterality: N/A;  . CARDIAC CATHETERIZATION  12/17/2011   mild nonobstructive CAD - 20% LAD stenosis, 30% OM stenosis, 20% Cfx, 20% RCA lesion  . JOINT REPLACEMENT     Left total knee Dr. Wynelle Link 06-09-18  . LEFT HEART CATHETERIZATION WITH CORONARY ANGIOGRAM N/A 12/17/2011   Procedure: LEFT HEART CATHETERIZATION WITH CORONARY ANGIOGRAM;  Surgeon: Pixie Casino, MD;  Location: [facility] CATH LAB;  Service: Cardiovascular;  Laterality: N/A;  . TEE WITHOUT CARDIOVERSION  01/2012   EF 50-55%; calcified AV annulus, severely calcified AV leaflets, mild regurg (AVR)  . TOTAL KNEE ARTHROPLASTY Left 06/09/2018   Procedure: LEFT TOTAL KNEE ARTHROPLASTY;  Surgeon: Gaynelle Arabian, MD;  Location: [facility];  Service: Orthopedics;  Laterality: Left;       Current Outpatient Medications  Medication Sig Dispense Refill Last Dose  . aspirin EC 81 MG tablet Take 81 mg by mouth daily.     Marland Kitchen atorvastatin (LIPITOR) 40 MG tablet Take 40 mg by mouth daily.     Marland Kitchen BETA CAROTENE PO Take 1 tablet by mouth daily.      . Calcium-Magnesium-Zinc (CAL-MAG-ZINC PO) Take 1 tablet by mouth daily.      Marland Kitchen  esomeprazole (NEXIUM) 40 MG capsule Take 40 mg by mouth daily at 12 noon.      . hydrocortisone cream 1 % Apply 1 application topically 2 (two) times daily as needed for itching.      . irbesartan (AVAPRO) 75 MG tablet TAKE 1 TABLET BY MOUTH EVERY DAY (Patient taking differently: Take 75 mg by mouth daily. ) 90 tablet 2   . Misc Natural Products (GLUCOSAMINE CHONDROITIN ADV) TABS Take 2 tablets by mouth daily.      . Multiple  Vitamin (MULTIVITAMIN WITH MINERALS) TABS tablet Take 1 tablet by mouth daily.     . Omega-3 Fatty Acids (OMEGA-3 FISH OIL PO) Take 1 capsule by mouth daily.      . Saw Palmetto, Serenoa repens, (SAW PALMETTO PO) Take 1 tablet by mouth daily.      Marland Kitchen senna-docusate (SENOKOT-S) 8.6-50 MG tablet Take 1 tablet by mouth at bedtime. (Patient taking differently: Take 1 tablet by mouth daily as needed for moderate constipation. ) 30 tablet 0   . TURMERIC PO Take 1 capsule by mouth daily.      . vitamin E 400 UNIT capsule Take 400 Units by mouth daily.     . polyethylene glycol (MIRALAX / GLYCOLAX) packet Take 17 g by mouth 2 (two) times daily. (Patient not taking: Reported on 06/11/2019) 14 each 0 Not Taking at Unknown time  . tadalafil (CIALIS) 10 MG tablet Take 10 mg by mouth daily as needed for erectile dysfunction.       No Known Allergies  Social History   Tobacco Use  . Smoking status: Never Smoker  . Smokeless tobacco: Never Used  Substance Use Topics  . Alcohol use: Not Currently    Alcohol/week: 1.0 - 2.0 standard drinks    Types: 1 - 2 Standard drinks or equivalent per week    Comment: SOCIAL     Family History  Problem Relation Age of Onset  . Coronary artery disease Father 27       MI/smoker  . Colon cancer Brother   . Ovarian cancer Sister   . COPD Sister      Review of Systems  Constitutional: Negative.   HENT: Positive for hearing loss. Negative for congestion, ear discharge, ear pain, nosebleeds, sinus pain, sore throat and tinnitus.   Eyes: Negative.   Respiratory: Negative.  Negative for stridor.   Cardiovascular: Negative.   Gastrointestinal: Negative.   Genitourinary: Negative.   Musculoskeletal: Positive for back pain, falls and neck pain. Negative for joint pain and myalgias.  Skin: Negative.   Neurological: Negative.   Endo/Heme/Allergies: Negative.   Psychiatric/Behavioral: Negative.     Objective:  Physical Exam  Constitutional: He is oriented to  person, place, and time. He appears well-developed. No distress.  Obese  HENT:  Head: Normocephalic.  Right Ear: External ear normal.  Left Ear: External ear normal.  Nose: Nose normal.  Mouth/Throat: Oropharynx is clear and moist.  Eyes: Conjunctivae and EOM are normal.  Neck: Normal range of motion. Neck supple.  Cardiovascular: Normal rate, regular rhythm, normal heart sounds and intact distal pulses.  No murmur heard. Respiratory: Effort normal and breath sounds normal. No respiratory distress. He has no wheezes.  GI: Soft. Bowel sounds are normal. He exhibits no distension. There is no abdominal tenderness.  Musculoskeletal:     Comments: Left knee exam: Incision healed with no signs of infection. No swelling. No tenderness. Range of motion: 0 - 130 degrees. Knee is stable.  Right  Knee Exam: Varus deformity. No effusion. No Swelling. Range of motion is 5-125 degrees. Marked crepitus on range of motion of the knee. Tenderness is greater medially than laterally. Stable knee.  Neurological: He is alert and oriented to person, place, and time. He has normal strength. No sensory deficit.  Skin: No rash noted. He is not diaphoretic. No erythema.  Psychiatric: He has a normal mood and affect. His behavior is normal.    Ht: 5 ft 7 in  Wt: 225 lbs BMI: 35.2  BP: 138/74 sitting R arm Pulse: 72 bpm  Imaging Review Plain radiographs demonstrate severe degenerative joint disease of the right knee(s). The overall alignment ismild varus. The bone quality appears to be good for age and reported activity level.    Assessment/Plan:  End stage primary osteoarthritis, right knee   The patient history, physical examination, clinical judgment of the provider and imaging studies are consistent with end stage degenerative joint disease of the right knee(s) and total knee arthroplasty is deemed medically necessary. The treatment options including medical management, injection therapy  arthroscopy and arthroplasty were discussed at length. The risks and benefits of total knee arthroplasty were presented and reviewed. The risks due to aseptic loosening, infection, stiffness, patella tracking problems, thromboembolic complications and other imponderables were discussed. The patient acknowledged the explanation, agreed to proceed with the plan and consent was signed. Patient is being admitted for inpatient treatment for surgery, pain control, PT, OT, prophylactic antibiotics, VTE prophylaxis, progressive ambulation and ADL's and discharge planning. The patient is planning to be discharged home.     Anticipated LOS equal to or greater than 2 midnights due to - Age 35 and older with one or more of the following:  - Obesity  - Expected need for hospital services (PT, OT, Nursing) required for safe  discharge  - Anticipated need for postoperative skilled nursing care or inpatient rehab  - Active co-morbidities: None OR   - Unanticipated findings during/Post Surgery: None  - Patient is a high risk of re-admission due to: None    Risks and benefits of the surgery were discussed with the patient and Dr. Wynelle Link at their previous office visit, and the patient has elected to move forward with the aforementioned surgery. Post-operative care plans were discussed with the patient today.  Therapy Plans: outpatient therapy at [facility] Disposition: Home with wife DME needed: has equipment PCP: Dr. Deforest Hoyles- clearance received Cardio: Dr. Debara Pickett Other: chronic constipation Syncopal like symptoms with oxycodone  Instructed patient on meds to stop prior to surgery Follow up 2 weeks after surgery  Ardeen Jourdain, PA-C

## 2019-06-20 LAB — NOVEL CORONAVIRUS, NAA (HOSP ORDER, SEND-OUT TO REF LAB; TAT 18-24 HRS): SARS-CoV-2, NAA: NOT DETECTED

## 2019-06-21 MED ORDER — BUPIVACAINE LIPOSOME 1.3 % IJ SUSP
20.0000 mL | Freq: Once | INTRAMUSCULAR | Status: DC
  Filled 2019-06-21: qty 20

## 2019-06-22 ENCOUNTER — Encounter (HOSPITAL_COMMUNITY)
Admission: RE | Disposition: A | Discharge status: Home / Self Care | Payer: Self-pay | Source: Home / Self Care | Attending: Orthopedic Surgery

## 2019-06-22 ENCOUNTER — Ambulatory Visit (HOSPITAL_COMMUNITY)
Admission: RE | Admit: 2019-06-22 | Discharge: 2019-06-26 | Disposition: A | Discharge status: Home / Self Care | Payer: Medicare Other | Attending: Orthopedic Surgery | Admitting: Orthopedic Surgery

## 2019-06-22 ENCOUNTER — Ambulatory Visit (HOSPITAL_COMMUNITY): Payer: Medicare Other | Admitting: Anesthesiology

## 2019-06-22 ENCOUNTER — Encounter (HOSPITAL_COMMUNITY): Payer: Self-pay | Admitting: *Deleted

## 2019-06-22 ENCOUNTER — Other Ambulatory Visit: Payer: Self-pay

## 2019-06-22 ENCOUNTER — Ambulatory Visit (HOSPITAL_COMMUNITY): Payer: Medicare Other | Admitting: Physician Assistant

## 2019-06-22 DIAGNOSIS — E669 Obesity, unspecified: Secondary | ICD-10-CM | POA: Insufficient documentation

## 2019-06-22 DIAGNOSIS — Z825 Family history of asthma and other chronic lower respiratory diseases: Secondary | ICD-10-CM | POA: Diagnosis not present

## 2019-06-22 DIAGNOSIS — N400 Enlarged prostate without lower urinary tract symptoms: Secondary | ICD-10-CM | POA: Insufficient documentation

## 2019-06-22 DIAGNOSIS — R9431 Abnormal electrocardiogram [ECG] [EKG]: Secondary | ICD-10-CM | POA: Diagnosis not present

## 2019-06-22 DIAGNOSIS — M1710 Unilateral primary osteoarthritis, unspecified knee: Secondary | ICD-10-CM | POA: Diagnosis present

## 2019-06-22 DIAGNOSIS — Z953 Presence of xenogenic heart valve: Secondary | ICD-10-CM | POA: Insufficient documentation

## 2019-06-22 DIAGNOSIS — M1990 Unspecified osteoarthritis, unspecified site: Secondary | ICD-10-CM | POA: Diagnosis present

## 2019-06-22 DIAGNOSIS — M179 Osteoarthritis of knee, unspecified: Secondary | ICD-10-CM | POA: Diagnosis present

## 2019-06-22 DIAGNOSIS — Z800 Family history of malignant neoplasm of digestive organs: Secondary | ICD-10-CM | POA: Diagnosis not present

## 2019-06-22 DIAGNOSIS — E785 Hyperlipidemia, unspecified: Secondary | ICD-10-CM | POA: Insufficient documentation

## 2019-06-22 DIAGNOSIS — I10 Essential (primary) hypertension: Secondary | ICD-10-CM | POA: Diagnosis present

## 2019-06-22 DIAGNOSIS — K219 Gastro-esophageal reflux disease without esophagitis: Secondary | ICD-10-CM | POA: Diagnosis not present

## 2019-06-22 DIAGNOSIS — R61 Generalized hyperhidrosis: Secondary | ICD-10-CM | POA: Diagnosis not present

## 2019-06-22 DIAGNOSIS — Z6832 Body mass index (BMI) 32.0-32.9, adult: Secondary | ICD-10-CM | POA: Diagnosis not present

## 2019-06-22 DIAGNOSIS — Z96652 Presence of left artificial knee joint: Secondary | ICD-10-CM | POA: Diagnosis not present

## 2019-06-22 DIAGNOSIS — Z952 Presence of prosthetic heart valve: Secondary | ICD-10-CM

## 2019-06-22 DIAGNOSIS — I491 Atrial premature depolarization: Secondary | ICD-10-CM | POA: Insufficient documentation

## 2019-06-22 DIAGNOSIS — I6782 Cerebral ischemia: Secondary | ICD-10-CM | POA: Insufficient documentation

## 2019-06-22 DIAGNOSIS — I951 Orthostatic hypotension: Secondary | ICD-10-CM | POA: Diagnosis not present

## 2019-06-22 DIAGNOSIS — Z79899 Other long term (current) drug therapy: Secondary | ICD-10-CM | POA: Diagnosis not present

## 2019-06-22 DIAGNOSIS — R42 Dizziness and giddiness: Secondary | ICD-10-CM | POA: Insufficient documentation

## 2019-06-22 DIAGNOSIS — R55 Syncope and collapse: Secondary | ICD-10-CM | POA: Diagnosis present

## 2019-06-22 DIAGNOSIS — Z8249 Family history of ischemic heart disease and other diseases of the circulatory system: Secondary | ICD-10-CM | POA: Insufficient documentation

## 2019-06-22 DIAGNOSIS — E871 Hypo-osmolality and hyponatremia: Secondary | ICD-10-CM | POA: Diagnosis not present

## 2019-06-22 DIAGNOSIS — M1711 Unilateral primary osteoarthritis, right knee: Secondary | ICD-10-CM | POA: Insufficient documentation

## 2019-06-22 DIAGNOSIS — Z7982 Long term (current) use of aspirin: Secondary | ICD-10-CM | POA: Diagnosis not present

## 2019-06-22 DIAGNOSIS — K5900 Constipation, unspecified: Secondary | ICD-10-CM | POA: Insufficient documentation

## 2019-06-22 DIAGNOSIS — Z8041 Family history of malignant neoplasm of ovary: Secondary | ICD-10-CM | POA: Insufficient documentation

## 2019-06-22 DIAGNOSIS — I2510 Atherosclerotic heart disease of native coronary artery without angina pectoris: Secondary | ICD-10-CM | POA: Diagnosis not present

## 2019-06-22 HISTORY — PX: TOTAL KNEE ARTHROPLASTY: SHX125

## 2019-06-22 LAB — TYPE AND SCREEN
ABO/RH(D): A POS
Antibody Screen: NEGATIVE

## 2019-06-22 SURGERY — ARTHROPLASTY, KNEE, TOTAL
Anesthesia: Regional | Site: Knee | Laterality: Right

## 2019-06-22 MED ORDER — METHOCARBAMOL 500 MG IVPB - SIMPLE MED
500.0000 mg | Freq: Four times a day (QID) | INTRAVENOUS | Status: DC | PRN
  Filled 2019-06-22: qty 50

## 2019-06-22 MED ORDER — PROPOFOL 500 MG/50ML IV EMUL
INTRAVENOUS | Status: AC
  Filled 2019-06-22: qty 50

## 2019-06-22 MED ORDER — 0.9 % SODIUM CHLORIDE (POUR BTL) OPTIME
TOPICAL | Status: DC | PRN
  Administered 2019-06-22: 1000 mL

## 2019-06-22 MED ORDER — SODIUM CHLORIDE 0.9 % IR SOLN
Status: DC | PRN
  Administered 2019-06-22: 1000 mL

## 2019-06-22 MED ORDER — POVIDONE-IODINE 10 % EX SWAB
2.0000 "application " | Freq: Once | CUTANEOUS | Status: AC
  Administered 2019-06-22: 2 via TOPICAL

## 2019-06-22 MED ORDER — RIVAROXABAN 10 MG PO TABS
10.0000 mg | ORAL_TABLET | Freq: Every day | ORAL | Status: DC
  Administered 2019-06-23 – 2019-06-26 (×4): 10 mg via ORAL
  Filled 2019-06-22 (×4): qty 1

## 2019-06-22 MED ORDER — MIDAZOLAM HCL 2 MG/2ML IJ SOLN
1.0000 mg | Freq: Once | INTRAMUSCULAR | Status: DC
  Filled 2019-06-22: qty 2

## 2019-06-22 MED ORDER — BUPIVACAINE IN DEXTROSE 0.75-8.25 % IT SOLN
INTRATHECAL | Status: DC | PRN
  Administered 2019-06-22: 1.8 mL via INTRATHECAL

## 2019-06-22 MED ORDER — METOCLOPRAMIDE HCL 5 MG PO TABS
5.0000 mg | ORAL_TABLET | Freq: Three times a day (TID) | ORAL | Status: DC | PRN
  Filled 2019-06-22: qty 2

## 2019-06-22 MED ORDER — ACETAMINOPHEN 500 MG PO TABS: 1000.0000 mg | ORAL_TABLET | Freq: Once | ORAL | Status: DC

## 2019-06-22 MED ORDER — DEXAMETHASONE SODIUM PHOSPHATE 10 MG/ML IJ SOLN
INTRAMUSCULAR | Status: DC | PRN
  Administered 2019-06-22: 8 mg via INTRAVENOUS

## 2019-06-22 MED ORDER — CEFAZOLIN SODIUM-DEXTROSE 2-4 GM/100ML-% IV SOLN
2.0000 g | Freq: Four times a day (QID) | INTRAVENOUS | Status: AC
  Administered 2019-06-22 (×2): 2 g via INTRAVENOUS
  Filled 2019-06-22 (×2): qty 100

## 2019-06-22 MED ORDER — ACETAMINOPHEN 10 MG/ML IV SOLN
1000.0000 mg | Freq: Four times a day (QID) | INTRAVENOUS | Status: DC
  Administered 2019-06-22: 1000 mg via INTRAVENOUS
  Filled 2019-06-22: qty 100

## 2019-06-22 MED ORDER — DIPHENHYDRAMINE HCL 12.5 MG/5ML PO ELIX
12.5000 mg | ORAL_SOLUTION | ORAL | Status: DC | PRN
  Filled 2019-06-22: qty 10

## 2019-06-22 MED ORDER — ACETAMINOPHEN 500 MG PO TABS
1000.0000 mg | ORAL_TABLET | Freq: Four times a day (QID) | ORAL | Status: AC
  Administered 2019-06-22 – 2019-06-23 (×4): 1000 mg via ORAL
  Filled 2019-06-22 (×5): qty 2

## 2019-06-22 MED ORDER — PHENOL 1.4 % MT LIQD
1.0000 | OROMUCOSAL | Status: DC | PRN
  Filled 2019-06-22: qty 177

## 2019-06-22 MED ORDER — ONDANSETRON HCL 4 MG/2ML IJ SOLN
INTRAMUSCULAR | Status: AC
  Filled 2019-06-22: qty 2

## 2019-06-22 MED ORDER — PROPOFOL 500 MG/50ML IV EMUL
INTRAVENOUS | Status: DC | PRN
  Administered 2019-06-22: 80 ug/kg/min via INTRAVENOUS

## 2019-06-22 MED ORDER — LACTATED RINGERS IV SOLN
INTRAVENOUS | Status: DC
  Administered 2019-06-22 (×2): via INTRAVENOUS

## 2019-06-22 MED ORDER — CEFAZOLIN SODIUM-DEXTROSE 2-4 GM/100ML-% IV SOLN
2.0000 g | INTRAVENOUS | Status: AC
  Administered 2019-06-22: 2 g via INTRAVENOUS
  Filled 2019-06-22: qty 100

## 2019-06-22 MED ORDER — ATORVASTATIN CALCIUM 40 MG PO TABS
40.0000 mg | ORAL_TABLET | Freq: Every day | ORAL | Status: DC
  Administered 2019-06-23 – 2019-06-26 (×4): 40 mg via ORAL
  Filled 2019-06-22 (×4): qty 1

## 2019-06-22 MED ORDER — STERILE WATER FOR IRRIGATION IR SOLN
Status: DC | PRN
  Administered 2019-06-22: 2000 mL

## 2019-06-22 MED ORDER — FENTANYL CITRATE (PF) 100 MCG/2ML IJ SOLN: 25.0000 ug | INTRAMUSCULAR | Status: DC | PRN

## 2019-06-22 MED ORDER — OXYCODONE HCL 5 MG PO TABS
5.0000 mg | ORAL_TABLET | ORAL | Status: DC | PRN
  Administered 2019-06-22: 10 mg via ORAL
  Administered 2019-06-22: 5 mg via ORAL
  Filled 2019-06-22: qty 1
  Filled 2019-06-22: qty 2

## 2019-06-22 MED ORDER — FENTANYL CITRATE (PF) 100 MCG/2ML IJ SOLN
50.0000 ug | Freq: Once | INTRAMUSCULAR | Status: AC
  Administered 2019-06-22: 50 ug via INTRAVENOUS
  Filled 2019-06-22: qty 2

## 2019-06-22 MED ORDER — PROPOFOL 10 MG/ML IV BOLUS
INTRAVENOUS | Status: AC
  Filled 2019-06-22: qty 20

## 2019-06-22 MED ORDER — ROPIVACAINE HCL 5 MG/ML IJ SOLN
INTRAMUSCULAR | Status: DC | PRN
  Administered 2019-06-22: 30 mL via PERINEURAL

## 2019-06-22 MED ORDER — DEXAMETHASONE SODIUM PHOSPHATE 10 MG/ML IJ SOLN
10.0000 mg | Freq: Once | INTRAMUSCULAR | Status: AC
  Administered 2019-06-23: 10 mg via INTRAVENOUS
  Filled 2019-06-22: qty 1

## 2019-06-22 MED ORDER — CHLORHEXIDINE GLUCONATE 4 % EX LIQD: 60.0000 mL | Freq: Once | CUTANEOUS | Status: DC

## 2019-06-22 MED ORDER — ONDANSETRON HCL 4 MG/2ML IJ SOLN
INTRAMUSCULAR | Status: DC | PRN
  Administered 2019-06-22: 4 mg via INTRAVENOUS

## 2019-06-22 MED ORDER — EPHEDRINE SULFATE-NACL 50-0.9 MG/10ML-% IV SOSY
PREFILLED_SYRINGE | INTRAVENOUS | Status: DC | PRN
  Administered 2019-06-22: 10 mg via INTRAVENOUS
  Administered 2019-06-22: 5 mg via INTRAVENOUS

## 2019-06-22 MED ORDER — SODIUM CHLORIDE 0.9 % IV SOLN: 30.0000 ug/min | INTRAVENOUS | Status: DC

## 2019-06-22 MED ORDER — SODIUM CHLORIDE (PF) 0.9 % IJ SOLN
INTRAMUSCULAR | Status: DC | PRN
  Administered 2019-06-22: 60 mL

## 2019-06-22 MED ORDER — PHENYLEPHRINE HCL (PRESSORS) 10 MG/ML IV SOLN
INTRAVENOUS | Status: AC
  Filled 2019-06-22: qty 1

## 2019-06-22 MED ORDER — PANTOPRAZOLE SODIUM 40 MG PO TBEC
80.0000 mg | DELAYED_RELEASE_TABLET | Freq: Every day | ORAL | Status: DC
  Administered 2019-06-23 – 2019-06-26 (×4): 80 mg via ORAL
  Filled 2019-06-22 (×4): qty 2

## 2019-06-22 MED ORDER — METOCLOPRAMIDE HCL 5 MG/ML IJ SOLN: 5.0000 mg | Freq: Three times a day (TID) | INTRAMUSCULAR | Status: DC | PRN

## 2019-06-22 MED ORDER — DOCUSATE SODIUM 100 MG PO CAPS
100.0000 mg | ORAL_CAPSULE | Freq: Two times a day (BID) | ORAL | Status: DC
  Administered 2019-06-22 – 2019-06-26 (×7): 100 mg via ORAL
  Filled 2019-06-22 (×7): qty 1

## 2019-06-22 MED ORDER — BUPIVACAINE LIPOSOME 1.3 % IJ SUSP
INTRAMUSCULAR | Status: DC | PRN
  Administered 2019-06-22: 20 mL

## 2019-06-22 MED ORDER — IRBESARTAN 75 MG PO TABS
75.0000 mg | ORAL_TABLET | Freq: Every day | ORAL | Status: DC
  Administered 2019-06-23 – 2019-06-26 (×4): 75 mg via ORAL
  Filled 2019-06-22 (×4): qty 1

## 2019-06-22 MED ORDER — SODIUM CHLORIDE 0.9 % IV SOLN
INTRAVENOUS | Status: DC | PRN
  Administered 2019-06-22: 40 ug/min via INTRAVENOUS

## 2019-06-22 MED ORDER — POLYETHYLENE GLYCOL 3350 17 G PO PACK
17.0000 g | PACK | Freq: Every day | ORAL | Status: DC | PRN
  Administered 2019-06-23: 17 g via ORAL
  Filled 2019-06-22: qty 1

## 2019-06-22 MED ORDER — SODIUM CHLORIDE (PF) 0.9 % IJ SOLN
INTRAMUSCULAR | Status: AC
  Filled 2019-06-22: qty 10

## 2019-06-22 MED ORDER — DEXAMETHASONE SODIUM PHOSPHATE 10 MG/ML IJ SOLN
INTRAMUSCULAR | Status: AC
  Filled 2019-06-22: qty 1

## 2019-06-22 MED ORDER — HYDRALAZINE HCL 10 MG PO TABS
10.0000 mg | ORAL_TABLET | Freq: Three times a day (TID) | ORAL | Status: DC | PRN
  Filled 2019-06-22 (×2): qty 1

## 2019-06-22 MED ORDER — TADALAFIL 20 MG PO TABS
10.0000 mg | ORAL_TABLET | Freq: Every day | ORAL | Status: DC | PRN
  Filled 2019-06-22: qty 0.5

## 2019-06-22 MED ORDER — METHOCARBAMOL 500 MG PO TABS
500.0000 mg | ORAL_TABLET | Freq: Four times a day (QID) | ORAL | Status: DC | PRN
  Administered 2019-06-22 – 2019-06-23 (×3): 500 mg via ORAL
  Filled 2019-06-22 (×3): qty 1

## 2019-06-22 MED ORDER — TRAMADOL HCL 50 MG PO TABS
50.0000 mg | ORAL_TABLET | Freq: Four times a day (QID) | ORAL | Status: DC | PRN
  Administered 2019-06-23 (×4): 100 mg via ORAL
  Administered 2019-06-24 (×2): 50 mg via ORAL
  Filled 2019-06-22 (×4): qty 2
  Filled 2019-06-22 (×2): qty 1

## 2019-06-22 MED ORDER — ONDANSETRON HCL 4 MG PO TABS
4.0000 mg | ORAL_TABLET | Freq: Four times a day (QID) | ORAL | Status: DC | PRN
  Filled 2019-06-22: qty 1

## 2019-06-22 MED ORDER — MORPHINE SULFATE (PF) 2 MG/ML IV SOLN: 1.0000 mg | INTRAVENOUS | Status: DC | PRN

## 2019-06-22 MED ORDER — BISACODYL 10 MG RE SUPP
10.0000 mg | Freq: Every day | RECTAL | Status: DC | PRN
  Administered 2019-06-24: 10 mg via RECTAL
  Filled 2019-06-22: qty 1

## 2019-06-22 MED ORDER — TRANEXAMIC ACID-NACL 1000-0.7 MG/100ML-% IV SOLN
1000.0000 mg | INTRAVENOUS | Status: AC
  Administered 2019-06-22: 1000 mg via INTRAVENOUS
  Filled 2019-06-22: qty 100

## 2019-06-22 MED ORDER — ONDANSETRON HCL 4 MG/2ML IJ SOLN: 4.0000 mg | Freq: Once | INTRAMUSCULAR | Status: DC | PRN

## 2019-06-22 MED ORDER — SODIUM CHLORIDE 0.9 % IV SOLN
INTRAVENOUS | Status: DC
  Administered 2019-06-22: 11:00:00 via INTRAVENOUS

## 2019-06-22 MED ORDER — ONDANSETRON HCL 4 MG/2ML IJ SOLN: 4.0000 mg | Freq: Four times a day (QID) | INTRAMUSCULAR | Status: DC | PRN

## 2019-06-22 MED ORDER — PROPOFOL 10 MG/ML IV BOLUS
INTRAVENOUS | Status: DC | PRN
  Administered 2019-06-22 (×2): 20 mg via INTRAVENOUS

## 2019-06-22 MED ORDER — EPHEDRINE 5 MG/ML INJ
INTRAVENOUS | Status: AC
  Filled 2019-06-22: qty 10

## 2019-06-22 MED ORDER — MENTHOL 3 MG MT LOZG: 1.0000 | LOZENGE | OROMUCOSAL | Status: DC | PRN

## 2019-06-22 MED ORDER — SODIUM CHLORIDE (PF) 0.9 % IJ SOLN
INTRAMUSCULAR | Status: AC
  Filled 2019-06-22: qty 50

## 2019-06-22 MED ORDER — DEXAMETHASONE SODIUM PHOSPHATE 10 MG/ML IJ SOLN: 8.0000 mg | Freq: Once | INTRAMUSCULAR | Status: DC

## 2019-06-22 MED ORDER — FLEET ENEMA 7-19 GM/118ML RE ENEM: 1.0000 | ENEMA | Freq: Once | RECTAL | Status: DC | PRN

## 2019-06-22 SURGICAL SUPPLY — 58 items
BAG SPEC THK2 15X12 ZIP CLS (MISCELLANEOUS) ×1
BAG ZIPLOCK 12X15 (MISCELLANEOUS) ×2 IMPLANT
BLADE SAG 18X100X1.27 (BLADE) ×2 IMPLANT
BLADE SAW SGTL 11.0X1.19X90.0M (BLADE) ×2 IMPLANT
BLADE SURG SZ10 CARB STEEL (BLADE) ×4 IMPLANT
BNDG ELASTIC 6X5.8 VLCR STR LF (GAUZE/BANDAGES/DRESSINGS) ×2 IMPLANT
BOWL SMART MIX CTS (DISPOSABLE) ×2 IMPLANT
CEMENT HV SMART SET (Cement) ×4 IMPLANT
CEMENT TIBIA MBT SIZE 4 (Knees) IMPLANT
CLSR STERI-STRIP ANTIMIC 1/2X4 (GAUZE/BANDAGES/DRESSINGS) ×2 IMPLANT
COVER SURGICAL LIGHT HANDLE (MISCELLANEOUS) ×2 IMPLANT
COVER WAND RF STERILE (DRAPES) ×1 IMPLANT
CUFF TOURN SGL QUICK 34 (TOURNIQUET CUFF) ×2
CUFF TRNQT CYL 34X4.125X (TOURNIQUET CUFF) ×1 IMPLANT
DECANTER SPIKE VIAL GLASS SM (MISCELLANEOUS) ×2 IMPLANT
DRAPE U-SHAPE 47X51 STRL (DRAPES) ×2 IMPLANT
DRSG ADAPTIC 3X8 NADH LF (GAUZE/BANDAGES/DRESSINGS) ×2 IMPLANT
DRSG PAD ABDOMINAL 8X10 ST (GAUZE/BANDAGES/DRESSINGS) ×2 IMPLANT
DURAPREP 26ML APPLICATOR (WOUND CARE) ×2 IMPLANT
ELECT REM PT RETURN 15FT ADLT (MISCELLANEOUS) ×2 IMPLANT
EVACUATOR 1/8 PVC DRAIN (DRAIN) ×2 IMPLANT
FEMUR SIGMA PS SZ 4.0 R (Femur) ×1 IMPLANT
GAUZE SPONGE 4X4 12PLY STRL (GAUZE/BANDAGES/DRESSINGS) ×2 IMPLANT
GLOVE BIO SURGEON STRL SZ7 (GLOVE) ×2 IMPLANT
GLOVE BIO SURGEON STRL SZ8 (GLOVE) ×2 IMPLANT
GLOVE BIOGEL PI IND STRL 6.5 (GLOVE) ×1 IMPLANT
GLOVE BIOGEL PI IND STRL 7.0 (GLOVE) ×1 IMPLANT
GLOVE BIOGEL PI IND STRL 8 (GLOVE) ×1 IMPLANT
GLOVE BIOGEL PI INDICATOR 6.5 (GLOVE) ×1
GLOVE BIOGEL PI INDICATOR 7.0 (GLOVE) ×1
GLOVE BIOGEL PI INDICATOR 8 (GLOVE) ×1
GLOVE SURG SS PI 6.5 STRL IVOR (GLOVE) ×2 IMPLANT
GOWN STRL REUS W/TWL LRG LVL3 (GOWN DISPOSABLE) ×6 IMPLANT
HANDPIECE INTERPULSE COAX TIP (DISPOSABLE) ×2
HOLDER FOLEY CATH W/STRAP (MISCELLANEOUS) ×1 IMPLANT
IMMOBILIZER KNEE 20 (SOFTGOODS) ×2
IMMOBILIZER KNEE 20 THIGH 36 (SOFTGOODS) ×1 IMPLANT
KIT TURNOVER KIT A (KITS) IMPLANT
MANIFOLD NEPTUNE II (INSTRUMENTS) ×2 IMPLANT
NS IRRIG 1000ML POUR BTL (IV SOLUTION) ×2 IMPLANT
PACK TOTAL KNEE CUSTOM (KITS) ×2 IMPLANT
PADDING CAST COTTON 6X4 STRL (CAST SUPPLIES) ×5 IMPLANT
PATELLA DOME PFC 41MM (Knees) ×1 IMPLANT
PIN STEINMAN FIXATION KNEE (PIN) ×1 IMPLANT
PLATE ROT INSERT 15MM SIZE 4 (Plate) ×1 IMPLANT
PROTECTOR NERVE ULNAR (MISCELLANEOUS) ×2 IMPLANT
SET HNDPC FAN SPRY TIP SCT (DISPOSABLE) ×1 IMPLANT
STRIP CLOSURE SKIN 1/2X4 (GAUZE/BANDAGES/DRESSINGS) ×4 IMPLANT
SUT MNCRL AB 4-0 PS2 18 (SUTURE) ×2 IMPLANT
SUT STRATAFIX 0 PDS 27 VIOLET (SUTURE) ×2
SUT VIC AB 2-0 CT1 27 (SUTURE) ×6
SUT VIC AB 2-0 CT1 TAPERPNT 27 (SUTURE) ×3 IMPLANT
SUTURE STRATFX 0 PDS 27 VIOLET (SUTURE) ×1 IMPLANT
TIBIA MBT CEMENT SIZE 4 (Knees) ×2 IMPLANT
TRAY FOLEY MTR SLVR 16FR STAT (SET/KITS/TRAYS/PACK) ×2 IMPLANT
WATER STERILE IRR 1000ML POUR (IV SOLUTION) ×4 IMPLANT
WRAP KNEE MAXI GEL POST OP (GAUZE/BANDAGES/DRESSINGS) ×2 IMPLANT
YANKAUER SUCT BULB TIP 10FT TU (MISCELLANEOUS) ×2 IMPLANT

## 2019-06-22 NOTE — Anesthesia Procedure Notes (Signed)
Anesthesia Regional Block: Adductor canal block   Pre-Anesthetic Checklist: ,, timeout performed, Correct Patient, Correct Site, Correct Laterality, Correct Procedure,, site marked, risks and benefits discussed, Surgical consent,  Pre-op evaluation,  At surgeon's request and post-op pain management  Laterality: Right  Prep: chloraprep       Needles:  Injection technique: Single-shot  Needle Type: Echogenic Stimulator Needle     Needle Length: 9cm  Needle Gauge: 21     Additional Needles:   Procedures:,,,, ultrasound used (permanent image in chart),,,,  Narrative:  Start time: 06/22/2019 7:50 AM End time: 06/22/2019 8:00 AM Injection made incrementally with aspirations every 5 mL.  Performed by: Personally  Anesthesiologist: Murvin Natal, MD  Additional Notes: Functioning IV was confirmed and monitors were applied. A time-out was performed. Hand hygiene and sterile gloves were used. The thigh was placed in a frog-leg position and prepped in a sterile fashion. A 25mm 21ga Arrow echogenic stimulator needle was placed using ultrasound guidance.  Negative aspiration and negative test dose prior to incremental administration of local anesthetic. The patient tolerated the procedure well.

## 2019-06-22 NOTE — Evaluation (Signed)
Physical Therapy Evaluation Patient Details Name: Andrew Bonilla MRN: [id number] DOB: Apr 20, 1933 Today's Date: 06/22/2019   History of Present Illness  s/p R TKA; PMH: L TKA, AVR  Clinical Impression  Pt is s/p TKA resulting in the deficits listed below (see PT Problem List).  Pt amb 60' with RW, some mild dizziness after seated in chair. NT checking VS.  Pt had episode of dizziness after d/c last admission with re-admission. Per pt and wife likely d/c on Wednesday, monitor progress and await MD input.    Pt will benefit from PT in acute setting to increase their independence and safety with mobility to allow discharge to the venue listed below.      Follow Up Recommendations Follow surgeon's recommendation for DC plan and follow-up therapies    Equipment Recommendations  None recommended by PT    Recommendations for Other Services       Precautions / Restrictions Precautions Precautions: Knee;Fall Restrictions Weight Bearing Restrictions: No Other Position/Activity Restrictions: WBAT      Mobility  Bed Mobility Overal bed mobility: Needs Assistance Bed Mobility: Supine to Sit     Supine to sit: Min guard     General bed mobility comments: for safety, effortful, incr time--pt did not want any assist  Transfers Overall transfer level: Needs assistance Equipment used: Rolling walker (2 wheeled) Transfers: Sit to/from Stand Sit to Stand: Min guard         General transfer comment: cues for safety and hand placement  Ambulation/Gait Ambulation/Gait assistance: Min guard Gait Distance (Feet): 60 Feet Assistive device: Rolling walker (2 wheeled) Gait Pattern/deviations: Step-to pattern;Decreased stance time - right;Decreased weight shift to right     General Gait Details: cues for sequence and RW safety  Stairs            Wheelchair Mobility    Modified Rankin (Stroke Patients Only)       Balance Overall balance assessment: Mild deficits  observed, not formally tested                                           Pertinent Vitals/Pain Pain Assessment: 0-10 Pain Score: 4  Pain Location: right knee Pain Descriptors / Indicators: Aching;Grimacing;Discomfort Pain Intervention(s): Limited activity within patient's tolerance;Monitored during session;Repositioned    Home Living Family/patient expects to be discharged to:: Private residence Living Arrangements: Spouse/significant other Available Help at Discharge: Family Type of Home: House Home Access: Stairs to enter Entrance Stairs-Rails: None Entrance Stairs-Number of Steps: 1 and 1 Home Layout: Multi-level;Able to live on main level with bedroom/bathroom Home Equipment: Gilford Rile - 2 wheels      Prior Function Level of Independence: Independent               Hand Dominance        Extremity/Trunk Assessment   Upper Extremity Assessment Upper Extremity Assessment: Overall WFL for tasks assessed    Lower Extremity Assessment Lower Extremity Assessment: RLE deficits/detail RLE Deficits / Details: ankle WFL; knee AAROM grossly 10 to 50 degrees flexion. knee extension and hip flexion 3/5       Communication   Communication: HOH  Cognition Arousal/Alertness: Awake/alert Behavior During Therapy: WFL for tasks assessed/performed Overall Cognitive Status: Within Functional Limits for tasks assessed  General Comments      Exercises Total Joint Exercises Ankle Circles/Pumps: AROM;10 reps;Both Quad Sets: AROM;Both;10 reps Heel Slides: AROM;AAROM;Right;5 reps Hip ABduction/ADduction: AROM;Right;5 reps Straight Leg Raises: AROM;Right;5 reps   Assessment/Plan    PT Assessment    PT Problem List         PT Treatment Interventions      PT Goals (Current goals can be found in the Care Plan section)  Acute Rehab PT Goals PT Goal Formulation: With patient Time For Goal Achievement:  06/29/19 Potential to Achieve Goals: Good    Frequency     Barriers to discharge        Co-evaluation               AM-PAC PT "6 Clicks" Mobility  Outcome Measure Help needed turning from your back to your side while in a flat bed without using bedrails?: A Little Help needed moving from lying on your back to sitting on the side of a flat bed without using bedrails?: A Little Help needed moving to and from a bed to a chair (including a wheelchair)?: A Little Help needed standing up from a chair using your arms (e.g., wheelchair or bedside chair)?: A Little Help needed to walk in hospital room?: A Little Help needed climbing 3-5 steps with a railing? : A Lot 6 Click Score: 17    End of Session Equipment Utilized During Treatment: Gait belt;Right knee immobilizer Activity Tolerance: Patient tolerated treatment well Patient left: in chair;with call bell/phone within reach;with family/visitor present;with chair alarm set   PT Visit Diagnosis: Difficulty in walking, not elsewhere classified (R26.2)    Time: [id number] PT Time Calculation (min) (ACUTE ONLY): 30 min   Charges:   PT Evaluation $PT Eval Low Complexity: 1 Low PT Treatments $Gait Training: 8-22 mins        Andrew Bonilla, PT  Pager: [phone] Acute Rehab Dept [facility]): [id number]   06/22/2019   [facility] 06/22/2019, 2:59 PM

## 2019-06-22 NOTE — Progress Notes (Signed)
AssistedDr. Ellender with right, ultrasound guided, adductor canal block. Side rails up, monitors on throughout procedure. See vital signs in flow sheet. Tolerated Procedure well.  

## 2019-06-22 NOTE — Plan of Care (Signed)

## 2019-06-22 NOTE — Anesthesia Procedure Notes (Signed)
Spinal  Patient location during procedure: OR Start time: 06/22/2019 8:27 AM End time: 06/22/2019 8:32 AM Reason for block: at surgeon's request Staffing Resident/CRNA: Anne Fu, CRNA Performed: resident/CRNA  Preanesthetic Checklist Completed: patient identified, site marked, surgical consent, pre-op evaluation, timeout performed, IV checked, risks and benefits discussed and monitors and equipment checked Spinal Block Patient position: sitting Prep: DuraPrep Patient monitoring: heart rate, continuous pulse ox and blood pressure Approach: right paramedian Location: L3-4 Injection technique: single-shot Needle Needle type: Pencan  Needle gauge: 24 G Needle length: 9 cm Assessment Sensory level: T6 Additional Notes  Functioning IV was confirmed and monitors were applied. Expiration date of kit checked and confirmed. Sterile prep and drape, including hand hygiene and sterile gloves were used. The patient was positioned and the spine was prepped. The skin was anesthetized with lidocaine.  Free flow of clear CSF was obtained prior to injecting local anesthetic into the CSF X 2 attempt.  First attempt with at L2-L3 midline unable to pass converted to L3-L4 right paramedian. The spinal needle aspirated freely following injection.  The needle was carefully withdrawn. Patient tolerated procedure well, without complications. Loss of motor and sensory on exam post injection.

## 2019-06-22 NOTE — Interval H&P Note (Signed)
History and Physical Interval Note:  06/22/2019 6:50 AM  Andrew Bonilla  has presented today for surgery, with the diagnosis of right knee osteoarthritis.  The various methods of treatment have been discussed with the patient and family. After consideration of risks, benefits and other options for treatment, the patient has consented to  Procedure(s) with comments: TOTAL KNEE ARTHROPLASTY (Right) - 71min as a surgical intervention.  The patient's history has been reviewed, patient examined, no change in status, stable for surgery.  I have reviewed the patient's chart and labs.  Questions were answered to the patient's satisfaction.     [name] [name]

## 2019-06-22 NOTE — Discharge Instructions (Addendum)
° °Dr. Frank Aluisio °Total Joint Specialist °[facility] °[address] °[place], [place] [address] °[phone] ° °TOTAL KNEE REPLACEMENT POSTOPERATIVE DIRECTIONS ° °Knee Rehabilitation, Guidelines Following Surgery  °Results after knee surgery are often greatly improved when you follow the exercise, range of motion and muscle strengthening exercises prescribed by your doctor. Safety measures are also important to protect the knee from further injury. Any time any of these exercises cause you to have increased pain or swelling in your knee joint, decrease the amount until you are comfortable again and slowly increase them. If you have problems or questions, call your caregiver or physical therapist for advice.  ° °HOME CARE INSTRUCTIONS  °• Remove items at home which could result in a fall. This includes throw rugs or furniture in walking pathways.  °· ICE to the affected knee every three hours for 30 minutes at a time and then as needed for pain and swelling.  Continue to use ice on the knee for pain and swelling from surgery. You may notice swelling that will progress down to the foot and ankle.  This is normal after surgery.  Elevate the leg when you are not up walking on it.   °· Continue to use the breathing machine which will help keep your temperature down.  It is common for your temperature to cycle up and down following surgery, especially at night when you are not up moving around and exerting yourself.  The breathing machine keeps your lungs expanded and your temperature down. °· Do not place pillow under knee, focus on keeping the knee straight while resting ° °DIET °You may resume your previous home diet once your are discharged from the hospital. ° °DRESSING / WOUND CARE / SHOWERING °You may shower 3 days after surgery, but keep the wounds dry during showering.  You may use an occlusive plastic wrap (Press'n Seal for example), NO SOAKING/SUBMERGING IN THE BATHTUB.  If the bandage  gets wet, change with a clean dry gauze.  If the incision gets wet, pat the wound dry with a clean towel. °You may start showering once you are discharged home but do not submerge the incision under water. Just pat the incision dry and apply a dry gauze dressing on daily. °Change the surgical dressing daily and reapply a dry dressing each time. ° °ACTIVITY °Walk with your walker as instructed. °Use walker as long as suggested by your caregivers. °Avoid periods of inactivity such as sitting longer than an hour when not asleep. This helps prevent blood clots.  °You may resume a sexual relationship in one month or when given the OK by your doctor.  °You may return to work once you are cleared by your doctor.  °Do not drive a car for 6 weeks or until released by you surgeon.  °Do not drive while taking narcotics. ° °WEIGHT BEARING °Weight bearing as tolerated with assist device (walker, cane, etc) as directed, use it as long as suggested by your surgeon or therapist, typically at least 4-6 weeks. ° °POSTOPERATIVE CONSTIPATION PROTOCOL °Constipation - defined medically as fewer than three stools per week and severe constipation as less than one stool per week. ° °One of the most common issues patients have following surgery is constipation.  Even if you have a regular bowel pattern at home, your normal regimen is likely to be disrupted due to multiple reasons following surgery.  Combination of anesthesia, postoperative narcotics, change in appetite and fluid intake all can affect your bowels.    In order to avoid complications following surgery, here are some recommendations in order to help you during your recovery period. ° °Colace (docusate) - Pick up an over-the-counter form of Colace or another stool softener and take twice a day as long as you are requiring postoperative pain medications.  Take with a full glass of water daily.  If you experience loose stools or diarrhea, hold the colace until you stool forms back  up.  If your symptoms do not get better within 1 week or if they get worse, check with your doctor. ° °Dulcolax (bisacodyl) - Pick up over-the-counter and take as directed by the product packaging as needed to assist with the movement of your bowels.  Take with a full glass of water.  Use this product as needed if not relieved by Colace only.  ° °MiraLax (polyethylene glycol) - Pick up over-the-counter to have on hand.  MiraLax is a solution that will increase the amount of water in your bowels to assist with bowel movements.  Take as directed and can mix with a glass of water, juice, soda, coffee, or tea.  Take if you go more than two days without a movement. °Do not use MiraLax more than once per day. Call your doctor if you are still constipated or irregular after using this medication for 7 days in a row. ° °If you continue to have problems with postoperative constipation, please contact the office for further assistance and recommendations.  If you experience "the worst abdominal pain ever" or develop nausea or vomiting, please contact the office immediatly for further recommendations for treatment. ° °ITCHING ° If you experience itching with your medications, try taking only a single pain pill, or even half a pain pill at a time.  You can also use Benadryl over the counter for itching or also to help with sleep.  ° °TED HOSE STOCKINGS °Wear the elastic stockings on both legs for three weeks following surgery during the day but you may remove then at night for sleeping. ° °MEDICATIONS °See your medication summary on the “After Visit Summary” that the nursing staff will review with you prior to discharge.  You may have some home medications which will be placed on hold until you complete the course of blood thinner medication.  It is important for you to complete the blood thinner medication as prescribed by your surgeon.  Continue your approved medications as instructed at time of discharge. ° °PRECAUTIONS °If  you experience chest pain or shortness of breath - call 911 immediately for transfer to the hospital emergency department.  °If you develop a fever greater that 101 F, purulent drainage from wound, increased redness or drainage from wound, foul odor from the wound/dressing, or calf pain - CONTACT YOUR SURGEON.   °                                                °FOLLOW-UP APPOINTMENTS °Make sure you keep all of your appointments after your operation with your surgeon and caregivers. You should call the office at the above phone number and make an appointment for approximately two weeks after the date of your surgery or on the date instructed by your surgeon outlined in the "After Visit Summary". ° ° °RANGE OF MOTION AND STRENGTHENING EXERCISES  °Rehabilitation of the knee is important following a knee injury or   an operation. After just a few days of immobilization, the muscles of the thigh which control the knee become weakened and shrink (atrophy). Knee exercises are designed to build up the tone and strength of the thigh muscles and to improve knee motion. Often times heat used for twenty to thirty minutes before working out will loosen up your tissues and help with improving the range of motion but do not use heat for the first two weeks following surgery. These exercises can be done on a training (exercise) mat, on the floor, on a table or on a bed. Use what ever works the best and is most comfortable for you Knee exercises include:  °• Leg Lifts - While your knee is still immobilized in a splint or cast, you can do straight leg raises. Lift the leg to 60 degrees, hold for 3 sec, and slowly lower the leg. Repeat 10-20 times 2-3 times daily. Perform this exercise against resistance later as your knee gets better.  °• Quad and Hamstring Sets - Tighten up the muscle on the front of the thigh (Quad) and hold for 5-10 sec. Repeat this 10-20 times hourly. Hamstring sets are done by pushing the foot backward against an  object and holding for 5-10 sec. Repeat as with quad sets.  °· Leg Slides: Lying on your back, slowly slide your foot toward your buttocks, bending your knee up off the floor (only go as far as is comfortable). Then slowly slide your foot back down until your leg is flat on the floor again. °· Angel Wings: Lying on your back spread your legs to the side as far apart as you can without causing discomfort.  °A rehabilitation program following serious knee injuries can speed recovery and prevent re-injury in the future due to weakened muscles. Contact your doctor or a physical therapist for more information on knee rehabilitation.  ° °IF YOU ARE TRANSFERRED TO A SKILLED REHAB FACILITY °If the patient is transferred to a skilled rehab facility following release from the hospital, a list of the current medications will be sent to the facility for the patient to continue.  When discharged from the skilled rehab facility, please have the facility set up the patient's Home Health Physical Therapy prior to being released. Also, the skilled facility will be responsible for providing the patient with their medications at time of release from the facility to include their pain medication, the muscle relaxants, and their blood thinner medication. If the patient is still at the rehab facility at time of the two week follow up appointment, the skilled rehab facility will also need to assist the patient in arranging follow up appointment in our office and any transportation needs. ° °MAKE SURE YOU:  °• Understand these instructions.  °• Get help right away if you are not doing well or get worse.  ° ° °Pick up stool softner and laxative for home use following surgery while on pain medications. °Do not submerge incision under water. °Please use good hand washing techniques while changing dressing each day. °May shower starting three days after surgery. °Please use a clean towel to pat the incision dry following showers. °Continue to  use ice for pain and swelling after surgery. °Do not use any lotions or creams on the incision until instructed by your surgeon. ° °Information on my medicine - XARELTO® (Rivaroxaban) ° °This medication education was reviewed with me or my healthcare representative as part of my discharge preparation.  The pharmacist that spoke with me   during my hospital stay was:   ° °Why was Xarelto® prescribed for you? °Xarelto® was prescribed for you to reduce the risk of blood clots forming after orthopedic surgery. The medical term for these abnormal blood clots is venous thromboembolism (VTE). ° °What do you need to know about xarelto® ? °Take your Xarelto® ONCE DAILY at the same time every day. °You may take it either with or without food. ° °If you have difficulty swallowing the tablet whole, you may crush it and mix in applesauce just prior to taking your dose. ° °Take Xarelto® exactly as prescribed by your doctor and DO NOT stop taking Xarelto® without talking to the doctor who prescribed the medication.  Stopping without other VTE prevention medication to take the place of Xarelto® may increase your risk of developing a clot. ° °After discharge, you should have regular check-up appointments with your healthcare provider that is prescribing your Xarelto®.   ° °What do you do if you miss a dose? °If you miss a dose, take it as soon as you remember on the same day then continue your regularly scheduled once daily regimen the next day. Do not take two doses of Xarelto® on the same day.  ° °Important Safety Information °A possible side effect of Xarelto® is bleeding. You should call your healthcare provider right away if you experience any of the following: °? Bleeding from an injury or your nose that does not stop. °? Unusual colored urine (red or dark brown) or unusual colored stools (red or black). °? Unusual bruising for unknown reasons. °? A serious fall or if you hit your head (even if there is no bleeding). ° °Some  medicines may interact with Xarelto® and might increase your risk of bleeding while on Xarelto®. To help avoid this, consult your healthcare provider or pharmacist prior to using any new prescription or non-prescription medications, including herbals, vitamins, non-steroidal anti-inflammatory drugs (NSAIDs) and supplements. ° °This website has more information on Xarelto®: www.xarelto.com. ° ° ° ° °

## 2019-06-22 NOTE — Anesthesia Postprocedure Evaluation (Signed)
Anesthesia Post Note  Patient: Andrew Bonilla  Procedure(s) Performed: TOTAL KNEE ARTHROPLASTY (Right Knee)     Patient location during evaluation: PACU Anesthesia Type: Regional and Spinal Level of consciousness: oriented and awake Pain management: pain level controlled Vital Signs Assessment: post-procedure vital signs reviewed and stable Respiratory status: spontaneous breathing, respiratory function stable and patient connected to nasal cannula oxygen Cardiovascular status: blood pressure returned to baseline and stable Postop Assessment: no headache, no backache, no apparent nausea or vomiting and spinal receding Anesthetic complications: no    Last Vitals:  Vitals:   06/22/19 1456 06/22/19 1730  BP: 130/64 (!) 145/69  Pulse: 64 62  Resp:  17  Temp:  36.4 C  SpO2:  100%    Last Pain:  Vitals:   06/22/19 1730  TempSrc: Oral  PainSc:                  Layanna Charo P Klohe Lovering

## 2019-06-22 NOTE — Transfer of Care (Signed)
Immediate Anesthesia Transfer of Care Note  Patient: Andrew Bonilla  Procedure(s) Performed: Procedure(s) with comments: TOTAL KNEE ARTHROPLASTY (Right) - 76min  Patient Location: PACU  Anesthesia Type:Spinal  Level of Consciousness:  sedated, patient cooperative and responds to stimulation  Airway & Oxygen Therapy:Patient Spontanous Breathing and Patient connected to face mask oxgen  Post-op Assessment:  Report given to PACU RN and Post -op Vital signs reviewed and stable  Post vital signs:  Reviewed and stable  Last Vitals:  Vitals:   06/22/19 0808 06/22/19 0816  BP: (!) 172/86 (!) 182/82  Pulse: 74 75  Resp: 20 13  Temp:    SpO2: 123XX123 [id number]    Complications: No apparent anesthesia complications

## 2019-06-22 NOTE — Op Note (Signed)
OPERATIVE REPORT-TOTAL KNEE ARTHROPLASTY   Pre-operative diagnosis- Osteoarthritis  Right knee(s)  Post-operative diagnosis- Osteoarthritis Right knee(s)  Procedure-  Right  Total Knee Arthroplasty  Surgeon- Dione Plover. Evelena Masci, MD  Assistant- Ardeen Jourdain, PA-C   Anesthesia-  Adductor canal block and spinal  EBL- minimal   Drains Hemovac  Tourniquet time-  Total Tourniquet Time Documented: Thigh (Right) - 35 minutes Total: Thigh (Right) - 35 minutes     Complications- None  Condition-PACU - hemodynamically stable.   Brief Clinical Note  Andrew Bonilla is a 85 y.o. year old male with end stage OA of his right knee with progressively worsening pain and dysfunction. He has constant pain, with activity and at rest and significant functional deficits with difficulties even with ADLs. He has had extensive non-op management including analgesics, injections of cortisone and viscosupplements, and home exercise program, but remains in significant pain with significant dysfunction. Radiographs show bone on bone arthritis lateral and patellofemoral. He presents now for right Total Knee Arthroplasty.    Procedure in detail---   The patient is brought into the operating room and positioned supine on the operating table. After successful administration of  Adductor canal block and spinal,   a tourniquet is placed high on the  Right thigh(s) and the lower extremity is prepped and draped in the usual sterile fashion. Time out is performed by the operating team and then the  Right lower extremity is wrapped in Esmarch, knee flexed and the tourniquet inflated to 300 mmHg.       A midline incision is made with a ten blade through the subcutaneous tissue to the level of the extensor mechanism. A fresh blade is used to make a medial parapatellar arthrotomy. Soft tissue over the proximal medial tibia is subperiosteally elevated to the joint line with a knife and into the semimembranosus bursa with a  Cobb elevator. Soft tissue over the proximal lateral tibia is elevated with attention being paid to avoiding the patellar tendon on the tibial tubercle. The patella is everted, knee flexed 90 degrees and the ACL and PCL are removed. Findings are bone on bone lateral and patellofemoral with large global osteophytes        The drill is used to create a starting hole in the distal femur and the canal is thoroughly irrigated with sterile saline to remove the fatty contents. The 5 degree Right  valgus alignment guide is placed into the femoral canal and the distal femoral cutting block is pinned to remove 10 mm off the distal femur. Resection is made with an oscillating saw.      The tibia is subluxed forward and the menisci are removed. The extramedullary alignment guide is placed referencing proximally at the medial aspect of the tibial tubercle and distally along the second metatarsal axis and tibial crest. The block is pinned to remove 67mm off the more deficient lateral  side. Resection is made with an oscillating saw. Size 4 is the most appropriate size for the tibia and the proximal tibia is prepared with the modular drill and keel punch for that size.      The femoral sizing guide is placed and size 4 is most appropriate. Rotation is marked off the epicondylar axis and confirmed by creating a rectangular flexion gap at 90 degrees. The size 4 cutting block is pinned in this rotation and the anterior, posterior and chamfer cuts are made with the oscillating saw. The intercondylar block is then placed and that cut is made.  Trial size 3 tibial component, trial size 4 posterior stabilized femur and a 15  mm posterior stabilized rotating platform insert trial is placed. Full extension is achieved with excellent varus/valgus and anterior/posterior balance throughout full range of motion. The patella is everted and thickness measured to be 27  mm. Free hand resection is taken to 15 mm, a 41 template is placed,  lug holes are drilled, trial patella is placed, and it tracks normally. Osteophytes are removed off the posterior femur with the trial in place. All trials are removed and the cut bone surfaces prepared with pulsatile lavage. Cement is mixed and once ready for implantation, the size 4 tibial implant, size  4 posterior stabilized femoral component, and the size 41 patella are cemented in place and the patella is held with the clamp. The trial insert is placed and the knee held in full extension. The Exparel (20 ml mixed with 60 ml saline) is injected into the extensor mechanism, posterior capsule, medial and lateral gutters and subcutaneous tissues.  All extruded cement is removed and once the cement is hard the permanent 15 mm posterior stabilized rotating platform insert is placed into the tibial tray.      The wound is copiously irrigated with saline solution and the extensor mechanism closed over a hemovac drain with #1 V-loc suture. The tourniquet is released for a total tourniquet time of 35  minutes. Flexion against gravity is 140 degrees and the patella tracks normally. Subcutaneous tissue is closed with 2.0 vicryl and subcuticular with running 4.0 Monocryl. The incision is cleaned and dried and steri-strips and a bulky sterile dressing are applied. The limb is placed into a knee immobilizer and the patient is awakened and transported to recovery in stable condition.      Please note that a surgical assistant was a medical necessity for this procedure in order to perform it in a safe and expeditious manner. Surgical assistant was necessary to retract the ligaments and vital neurovascular structures to prevent injury to them and also necessary for proper positioning of the limb to allow for anatomic placement of the prosthesis.   Dione Plover Vearl Allbaugh, MD    06/22/2019, 9:31 AM

## 2019-06-23 ENCOUNTER — Encounter (HOSPITAL_COMMUNITY): Payer: Self-pay | Admitting: Orthopedic Surgery

## 2019-06-23 DIAGNOSIS — M1711 Unilateral primary osteoarthritis, right knee: Secondary | ICD-10-CM | POA: Diagnosis not present

## 2019-06-23 LAB — CBC
HCT: 31 % — ABNORMAL LOW (ref 39.0–52.0)
Hemoglobin: 10.5 g/dL — ABNORMAL LOW (ref 13.0–17.0)
MCH: 31 pg (ref 26.0–34.0)
MCHC: 33.9 g/dL (ref 30.0–36.0)
MCV: 91.4 fL (ref 80.0–100.0)
Platelets: 218 10*3/uL (ref 150–400)
RBC: 3.39 MIL/uL — ABNORMAL LOW (ref 4.22–5.81)
RDW: 12.6 % (ref 11.5–15.5)
WBC: 11.8 10*3/uL — ABNORMAL HIGH (ref 4.0–10.5)
nRBC: 0 % (ref 0.0–0.2)

## 2019-06-23 LAB — BASIC METABOLIC PANEL
Anion gap: 7 (ref 5–15)
BUN: 12 mg/dL (ref 8–23)
CO2: 24 mmol/L (ref 22–32)
Calcium: 8.4 mg/dL — ABNORMAL LOW (ref 8.9–10.3)
Chloride: 99 mmol/L (ref 98–111)
Creatinine, Ser: 0.62 mg/dL (ref 0.61–1.24)
GFR calc Af Amer: >60 mL/min (ref >60)
GFR calc non Af Amer: >60 mL/min (ref >60)
Glucose, Bld: 129 mg/dL — ABNORMAL HIGH (ref 70–99)
Potassium: 4.2 mmol/L (ref 3.5–5.1)
Sodium: 130 mmol/L — ABNORMAL LOW (ref 135–145)

## 2019-06-23 NOTE — Progress Notes (Signed)
Physical Therapy Treatment Patient Details Name: Andrew Bonilla MRN: [id number] DOB: [birth year] Today's Date: 06/23/2019    History of Present Illness s/p R TKA; PMH: L TKA, AVR    PT Comments    Pt ambulated in hallway short distance and then assisted to recliner.  Pt did not report any dizziness.     Follow Up Recommendations  Follow surgeon's recommendation for DC plan and follow-up therapies     Equipment Recommendations  None recommended by PT    Recommendations for Other Services       Precautions / Restrictions Precautions Precautions: Knee;Fall Required Braces or Orthoses: Knee Immobilizer - Right Restrictions Other Position/Activity Restrictions: WBAT    Mobility  Bed Mobility Overal bed mobility: Needs Assistance Bed Mobility: Supine to Sit     Supine to sit: Supervision     General bed mobility comments: for safety, effortful, incr time--pt did not want any assist  Transfers Overall transfer level: Needs assistance Equipment used: Rolling walker (2 wheeled) Transfers: Sit to/from Stand Sit to Stand: Min guard         General transfer comment: verbal cues for safety and hand placement  Ambulation/Gait Ambulation/Gait assistance: Min guard Gait Distance (Feet): 50 Feet Assistive device: Rolling walker (2 wheeled) Gait Pattern/deviations: Step-to pattern;Decreased stance time - right;Decreased weight shift to right     General Gait Details: verbal cues for sequence, RW positioning, step length   Stairs             Wheelchair Mobility    Modified Rankin (Stroke Patients Only)       Balance                                            Cognition Arousal/Alertness: Awake/alert Behavior During Therapy: WFL for tasks assessed/performed Overall Cognitive Status: Within Functional Limits for tasks assessed                                        Exercises Total Joint Exercises Ankle Circles/Pumps:  AROM;10 reps;Both Quad Sets: AROM;Both;5 reps    General Comments        Pertinent Vitals/Pain Pain Assessment: 0-10 Pain Score: 4  Pain Location: right knee Pain Descriptors / Indicators: Aching;Grimacing;Discomfort Pain Intervention(s): Monitored during session;Repositioned    Home Living                      Prior Function            PT Goals (current goals can now be found in the care plan section) Progress towards PT goals: Progressing toward goals    Frequency    7X/week      PT Plan Current plan remains appropriate    Co-evaluation              AM-PAC PT "6 Clicks" Mobility   Outcome Measure  Help needed turning from your back to your side while in a flat bed without using bedrails?: A Little Help needed moving from lying on your back to sitting on the side of a flat bed without using bedrails?: A Little Help needed moving to and from a bed to a chair (including a wheelchair)?: A Little Help needed standing up from a chair using your arms (e.g., wheelchair or bedside  chair)?: A Little Help needed to walk in hospital room?: A Little Help needed climbing 3-5 steps with a railing? : A Lot 6 Click Score: 17    End of Session Equipment Utilized During Treatment: Gait belt;Right knee immobilizer Activity Tolerance: Patient tolerated treatment well Patient left: in chair;with call bell/phone within reach;with family/visitor present   PT Visit Diagnosis: Difficulty in walking, not elsewhere classified (R26.2)     Time: [id number] PT Time Calculation (min) (ACUTE ONLY): 20 min  Charges:  $Gait Training: 8-22 mins                     Carmelia Bake, PT, DPT Acute Rehabilitation Services Office: [phone] Pager: [phone]  Trena Platt 06/23/2019, 1:45 PM

## 2019-06-23 NOTE — Progress Notes (Signed)
Subjective: 1 Day Post-Op Procedure(s) (LRB): TOTAL KNEE ARTHROPLASTY (Right) Patient reports pain as mild.   Patient seen in rounds by Dr. Wynelle Link. Patient is well, and has had no acute complaints or problems other than discomfort in the right knee. No acute events overnight. Patient was able to ambulate 60 feet with therapy yesterday, but reported mild dizziness. Foley cathter removed, positive flatus. We will continue therapy today.   Objective: Vital signs in last 24 hours: Temp:  [97.5 F (36.4 C)-98.4 F (36.9 C)] 98.2 F (36.8 C) (10/20 0506) Pulse Rate:  [51-91] 70 (10/20 0506) Resp:  [12-20] 16 (10/20 0506) BP: (94-182)/(47-86) 155/74 (10/20 0506) SpO2:  [99 %-100 %] 100 % (10/20 0506) Weight:  [95.3 kg] 95.3 kg (10/19 1045)  Intake/Output from previous day:  Intake/Output Summary (Last 24 hours) at 06/23/2019 0740 Last data filed at 06/23/2019 0600 Gross per 24 hour  Intake 5175.41 ml  Output 2970 ml  Net 2205.41 ml     Intake/Output this shift: No intake/output data recorded.  Labs: Recent Labs    06/23/19 0329  HGB 10.5*   Recent Labs    06/23/19 0329  WBC 11.8*  RBC 3.39*  HCT 31.0*  PLT 218   Recent Labs    06/23/19 0329  NA 130*  K 4.2  CL 99  CO2 24  BUN 12  CREATININE 0.62  GLUCOSE 129*  CALCIUM 8.4*   No results for input(s): LABPT, INR in the last 72 hours.  Exam: General - Patient is Alert and Oriented Extremity - Neurologically intact Sensation intact distally Intact pulses distally Dorsiflexion/Plantar flexion intact Dressing - dressing C/D/I Motor Function - intact, moving foot and toes well on exam.   Past Medical History:  Diagnosis Date  . Arthritis   . Constipation   . Deaf, left    WEARS HEARING AID RIGHT EAR   . GERD (gastroesophageal reflux disease)   . Hard of hearing   . Heart murmur    mod-severe aortic stenosis   . Hyperlipemia   . Hypertension   . Pneumonia    in college  . Prostate enlargement    . Recurrent upper respiratory infection (URI)    12/2011  . S/P AVR (aortic valve replacement) 01/16/2012   [facility] Ease 76mm pericardial tissue valve    Assessment/Plan: 1 Day Post-Op Procedure(s) (LRB): TOTAL KNEE ARTHROPLASTY (Right) Active Problems:   OA (osteoarthritis) of knee  Estimated body mass index is 32.89 kg/m as calculated from the following:   Height as of this encounter: 5\' 7"  (1.702 m).   Weight as of this encounter: 95.3 kg. Advance diet Up with therapy  Anticipated LOS equal to or greater than 2 midnights due to - Age 55 and older with one or more of the following:  - Obesity  - Expected need for hospital services (PT, OT, Nursing) required for safe  discharge  - Anticipated need for postoperative skilled nursing care or inpatient rehab  - Active co-morbidities: None OR   - Unanticipated findings during/Post Surgery: None  - Patient is a high risk of re-admission due to: None    DVT Prophylaxis - Xarelto Weight bearing as tolerated. D/C O2 and pulse ox and try on room air. Hemovac pulled without difficulty, will begin therapy today.  Plan is to go Home after hospital stay. Plan to continue working with therapy today towards safe discharge home tomorrow. Patient had an episode of syncope after last surgery, and reported some dizziness yesterday with  PT. Continue therapy and pain management today.  Griffith Citron, PA-C Orthopedic Surgery [phone] 06/23/2019, 7:40 AM

## 2019-06-23 NOTE — Progress Notes (Signed)
Physical Therapy Treatment Patient Details Name: Andrew Bonilla MRN: [id number] DOB: 10-22-1932 Today's Date: 06/23/2019    History of Present Illness s/p R TKA; PMH: L TKA, AVR    PT Comments    Pt ambulated in hallway and assisted back to bed.  Pt performed LE exercises in supine.  Pt plans to d/c home tomorrow.   Follow Up Recommendations  Follow surgeon's recommendation for DC plan and follow-up therapies     Equipment Recommendations  None recommended by PT    Recommendations for Other Services       Precautions / Restrictions Precautions Precautions: Knee;Fall Required Braces or Orthoses: Knee Immobilizer - Right Restrictions Other Position/Activity Restrictions: WBAT    Mobility  Bed Mobility Overal bed mobility: Needs Assistance Bed Mobility: Sit to Supine     Supine to sit: Supervision Sit to supine: Supervision   General bed mobility comments: for safety, effortful, incr time--pt did not want any assist  Transfers Overall transfer level: Needs assistance Equipment used: Rolling walker (2 wheeled) Transfers: Sit to/from Stand Sit to Stand: Min guard         General transfer comment: verbal cues for safety and hand placement and R LE forward for pain control  Ambulation/Gait Ambulation/Gait assistance: Min guard Gait Distance (Feet): 120 Feet Assistive device: Rolling walker (2 wheeled) Gait Pattern/deviations: Step-to pattern;Decreased stance time - right;Decreased weight shift to right Gait velocity: decr   General Gait Details: verbal cues for sequence, RW positioning, step length, posture   Stairs             Wheelchair Mobility    Modified Rankin (Stroke Patients Only)       Balance                                            Cognition Arousal/Alertness: Awake/alert Behavior During Therapy: WFL for tasks assessed/performed Overall Cognitive Status: Within Functional Limits for tasks assessed                                         Exercises Total Joint Exercises Ankle Circles/Pumps: AROM;10 reps;Both Quad Sets: AROM;Both;10 reps Heel Slides: AAROM;Right;10 reps Hip ABduction/ADduction: AAROM;Right;10 reps Straight Leg Raises: 10 reps;Right;AAROM    General Comments        Pertinent Vitals/Pain Pain Assessment: 0-10 Pain Score: 5  Pain Location: right knee Pain Descriptors / Indicators: Aching;Grimacing;Discomfort Pain Intervention(s): Limited activity within patient's tolerance;Monitored during session;Repositioned;Ice applied    Home Living                      Prior Function            PT Goals (current goals can now be found in the care plan section) Progress towards PT goals: Progressing toward goals    Frequency    7X/week      PT Plan Current plan remains appropriate    Co-evaluation              AM-PAC PT "6 Clicks" Mobility   Outcome Measure  Help needed turning from your back to your side while in a flat bed without using bedrails?: A Little Help needed moving from lying on your back to sitting on the side of a flat bed without using bedrails?:  A Little Help needed moving to and from a bed to a chair (including a wheelchair)?: A Little Help needed standing up from a chair using your arms (e.g., wheelchair or bedside chair)?: A Little Help needed to walk in hospital room?: A Little Help needed climbing 3-5 steps with a railing? : A Lot 6 Click Score: 17    End of Session Equipment Utilized During Treatment: Gait belt;Right knee immobilizer Activity Tolerance: Patient tolerated treatment well Patient left: with call bell/phone within reach;with family/visitor present;in bed;with bed alarm set   PT Visit Diagnosis: Difficulty in walking, not elsewhere classified (R26.2)     Time: [id number] PT Time Calculation (min) (ACUTE ONLY): 26 min  Charges:  $Gait Training: 8-22 mins $Therapeutic Exercise: 8-22 mins                      Carmelia Bake, PT, DPT Acute Rehabilitation Services Office: [phone] Pager: [phone]  Trena Platt 06/23/2019, 4:42 PM

## 2019-06-24 ENCOUNTER — Encounter (HOSPITAL_COMMUNITY): Payer: Self-pay | Admitting: Family Medicine

## 2019-06-24 DIAGNOSIS — M1711 Unilateral primary osteoarthritis, right knee: Secondary | ICD-10-CM | POA: Diagnosis not present

## 2019-06-24 DIAGNOSIS — E871 Hypo-osmolality and hyponatremia: Secondary | ICD-10-CM

## 2019-06-24 DIAGNOSIS — R55 Syncope and collapse: Secondary | ICD-10-CM | POA: Diagnosis not present

## 2019-06-24 DIAGNOSIS — M170 Bilateral primary osteoarthritis of knee: Secondary | ICD-10-CM | POA: Diagnosis not present

## 2019-06-24 DIAGNOSIS — Z952 Presence of prosthetic heart valve: Secondary | ICD-10-CM

## 2019-06-24 DIAGNOSIS — I10 Essential (primary) hypertension: Secondary | ICD-10-CM | POA: Diagnosis not present

## 2019-06-24 DIAGNOSIS — K219 Gastro-esophageal reflux disease without esophagitis: Secondary | ICD-10-CM | POA: Diagnosis not present

## 2019-06-24 DIAGNOSIS — E785 Hyperlipidemia, unspecified: Secondary | ICD-10-CM | POA: Diagnosis not present

## 2019-06-24 LAB — CBC
HCT: 31.1 % — ABNORMAL LOW (ref 39.0–52.0)
Hemoglobin: 10.5 g/dL — ABNORMAL LOW (ref 13.0–17.0)
MCH: 30.6 pg (ref 26.0–34.0)
MCHC: 33.8 g/dL (ref 30.0–36.0)
MCV: 90.7 fL (ref 80.0–100.0)
Platelets: 232 10*3/uL (ref 150–400)
RBC: 3.43 MIL/uL — ABNORMAL LOW (ref 4.22–5.81)
RDW: 12.8 % (ref 11.5–15.5)
WBC: 13.5 10*3/uL — ABNORMAL HIGH (ref 4.0–10.5)
nRBC: 0 % (ref 0.0–0.2)

## 2019-06-24 LAB — BASIC METABOLIC PANEL
Anion gap: 9 (ref 5–15)
BUN: 15 mg/dL (ref 8–23)
CO2: 24 mmol/L (ref 22–32)
Calcium: 8.7 mg/dL — ABNORMAL LOW (ref 8.9–10.3)
Chloride: 96 mmol/L — ABNORMAL LOW (ref 98–111)
Creatinine, Ser: 0.7 mg/dL (ref 0.61–1.24)
GFR calc Af Amer: >60 mL/min (ref >60)
GFR calc non Af Amer: >60 mL/min (ref >60)
Glucose, Bld: 106 mg/dL — ABNORMAL HIGH (ref 70–99)
Potassium: 4.2 mmol/L (ref 3.5–5.1)
Sodium: 129 mmol/L — ABNORMAL LOW (ref 135–145)

## 2019-06-24 MED ORDER — RIVAROXABAN 10 MG PO TABS: 10.0000 mg | ORAL_TABLET | Freq: Every day | ORAL | 0 refills | Status: DC

## 2019-06-24 MED ORDER — TRAMADOL HCL 50 MG PO TABS: 50.0000 mg | ORAL_TABLET | Freq: Four times a day (QID) | ORAL | 0 refills | Status: DC | PRN

## 2019-06-24 MED ORDER — SODIUM CHLORIDE 0.9 % IV BOLUS
250.0000 mL | Freq: Once | INTRAVENOUS | Status: AC
  Administered 2019-06-24: 250 mL via INTRAVENOUS

## 2019-06-24 MED ORDER — OXYCODONE HCL 5 MG PO TABS: 5.0000 mg | ORAL_TABLET | Freq: Four times a day (QID) | ORAL | 0 refills | Status: DC | PRN

## 2019-06-24 MED ORDER — SODIUM CHLORIDE 0.9 % IV SOLN
INTRAVENOUS | Status: DC
  Administered 2019-06-24: 21:00:00 via INTRAVENOUS

## 2019-06-24 MED ORDER — METHOCARBAMOL 500 MG PO TABS: 500.0000 mg | ORAL_TABLET | Freq: Four times a day (QID) | ORAL | 0 refills | Status: DC | PRN

## 2019-06-24 MED ORDER — OXYCODONE HCL 5 MG PO TABS
5.0000 mg | ORAL_TABLET | Freq: Four times a day (QID) | ORAL | Status: DC | PRN
  Administered 2019-06-24 – 2019-06-25 (×2): 5 mg via ORAL
  Filled 2019-06-24 (×2): qty 1

## 2019-06-24 NOTE — Progress Notes (Signed)
Physical Therapy Treatment Patient Details Name: Andrew Bonilla MRN: [id number] DOB: 1933-03-13 Today's Date: 06/24/2019    History of Present Illness s/p R TKA; PMH: L TKA, AVR    PT Comments    Pt was very motivated; however, he was limited by orthostatic BP.  He was only able to do some EOB exercises and then became diaphoretic and had to return to supine.  RN reports pt had just received BP meds.  Will need PT f/u this afternoon to progress as able toward goals.     Follow Up Recommendations  Follow surgeon's recommendation for DC plan and follow-up therapies     Equipment Recommendations  None recommended by PT    Recommendations for Other Services       Precautions / Restrictions Precautions Precautions: Knee;Fall Required Braces or Orthoses: Knee Immobilizer - Right Restrictions Weight Bearing Restrictions: No Other Position/Activity Restrictions: WBAT    Mobility  Bed Mobility Overal bed mobility: Needs Assistance Bed Mobility: Sit to Supine     Supine to sit: Min assist Sit to supine: Mod assist   General bed mobility comments: increased time required; required min A for R LE on/off bed; required increased assist sit to supine due to orthostatic hypotension  Transfers   Equipment used: Rolling walker (2 wheeled) Transfers: Sit to/from Stand Sit to Stand: Mod assist         General transfer comment: verbal cues for safety and hand placement and R LE forward for pain control  Ambulation/Gait Ambulation/Gait assistance: (unable due to orthostatic hypotension)               Stairs             Wheelchair Mobility    Modified Rankin (Stroke Patients Only)       Balance Overall balance assessment: Needs assistance Sitting-balance support: Bilateral upper extremity supported Sitting balance-Leahy Scale: Fair     Standing balance support: Bilateral upper extremity supported Standing balance-Leahy Scale: Poor Standing balance  comment: slight posterior lean; required assist when donning pants                            Cognition Arousal/Alertness: Awake/alert Behavior During Therapy: WFL for tasks assessed/performed Overall Cognitive Status: Within Functional Limits for tasks assessed                                        Exercises Total Joint Exercises Ankle Circles/Pumps: AROM;Both;10 reps Quad Sets: AROM;Right;10 reps Towel Squeeze: AROM;Both;10 reps Heel Slides: AAROM;10 reps;Right Long Arc Quad: AAROM;10 reps;Right Knee Flexion: AAROM;10 reps;Right Goniometric ROM: R knee lacks ~5 ext; 45 degrees flex    General Comments General comments (skin integrity, edema, etc.): Pt with some dizziness after standing for donning clothes BP was 121/59.  Performed EOB exercises and pt with increased dizziness and diaphoresis.  O2 sat 96%, HR 60-70 bpm, BP down to 104/52.  Pt returned to supine and began to feel better.  BP up to 117/63.  Able to complete supine ther ex.  Notified RN pt had orthostatic BP and would need another PT session.  She reports pt had just received BP med.      Pertinent Vitals/Pain Pain Assessment: No/denies pain(no pain rest; did grimace with ther ex but states just sore)    Home Living Family/patient expects to be discharged to:: Private residence  Living Arrangements: Spouse/significant other                  Prior Function            PT Goals (current goals can now be found in the care plan section) Progress towards PT goals: Not progressing toward goals - comment(limited by orthostatic BP)    Frequency    7X/week      PT Plan Current plan remains appropriate    Co-evaluation              AM-PAC PT "6 Clicks" Mobility   Outcome Measure  Help needed turning from your back to your side while in a flat bed without using bedrails?: A Little Help needed moving from lying on your back to sitting on the side of a flat bed without  using bedrails?: A Little Help needed moving to and from a bed to a chair (including a wheelchair)?: A Lot Help needed standing up from a chair using your arms (e.g., wheelchair or bedside chair)?: A Lot Help needed to walk in hospital room?: A Lot Help needed climbing 3-5 steps with a railing? : Total 6 Click Score: 13    End of Session Equipment Utilized During Treatment: Gait belt;Right knee immobilizer Activity Tolerance: Other (comment)(limited by BP) Patient left: in bed;with call bell/phone within reach;with bed alarm set Nurse Communication: Mobility status;Other (comment)(BP) PT Visit Diagnosis: Difficulty in walking, not elsewhere classified (R26.2)     Time: [id number] PT Time Calculation (min) (ACUTE ONLY): 39 min  Charges:  $Therapeutic Exercise: 23-37 mins $Therapeutic Activity: 8-22 mins                     Andrew Bonilla, PT Acute Rehab Services [phone]    Andrew Bonilla 06/24/2019, 10:47 AM

## 2019-06-24 NOTE — Progress Notes (Signed)
When working with PT this afternoon, pt became dizzy, diaphoretic,hypotensive when practicing stairs.  Became limp, pale, pt escorted back to chair.  Fluids restarted, MD notified and made aware.  RN will monitor, d/c cancelled. Pt came to, alert and oriented, resting in chair in room at this time. Wife at the bedside.

## 2019-06-24 NOTE — Progress Notes (Signed)
   Subjective: 2 Days Post-Op Procedure(s) (LRB): TOTAL KNEE ARTHROPLASTY (Right) Patient reports pain as mild.   Patient seen in rounds for Dr. Wynelle Link. Patient is well, and has had no acute complaints or problems. Reports minimal discomfort in the right knee. No SOB or chest pain. Denies dizziness. Voiding well. No BM but positive flatus.  Plan is to go Home after hospital stay.  Objective: Vital signs in last 24 hours: Temp:  [97.6 F (36.4 C)-98.3 F (36.8 C)] 98.2 F (36.8 C) (10/21 0435) Pulse Rate:  [62-66] 63 (10/21 0435) Resp:  [16-18] 16 (10/21 0435) BP: (130-153)/(58-74) 150/65 (10/21 0435) SpO2:  [95 %-99 %] 99 % (10/21 0435)  Intake/Output from previous day:  Intake/Output Summary (Last 24 hours) at 06/24/2019 0756 Last data filed at 06/24/2019 0435 Gross per 24 hour  Intake 300 ml  Output 2000 ml  Net -1700 ml     Labs: Recent Labs    06/23/19 0329 06/24/19 0311  HGB 10.5* 10.5*   Recent Labs    06/23/19 0329 06/24/19 0311  WBC 11.8* 13.5*  RBC 3.39* 3.43*  HCT 31.0* 31.1*  PLT 218 232   Recent Labs    06/23/19 0329 06/24/19 0311  NA 130* 129*  K 4.2 4.2  CL 99 96*  CO2 24 24  BUN 12 15  CREATININE 0.62 0.70  GLUCOSE 129* 106*  CALCIUM 8.4* 8.7*    EXAM General - Patient is Alert and Oriented Extremity - Neurologically intact Intact pulses distally Dorsiflexion/Plantar flexion intact No cellulitis present Compartment soft Dressing/Incision - clean, dry, no drainage Motor Function - intact, moving foot and toes well on exam.   Past Medical History:  Diagnosis Date  . Arthritis   . Constipation   . Deaf, left    WEARS HEARING AID RIGHT EAR   . GERD (gastroesophageal reflux disease)   . Hard of hearing   . Heart murmur    mod-severe aortic stenosis   . Hyperlipemia   . Hypertension   . Pneumonia    in college  . Prostate enlargement   . Recurrent upper respiratory infection (URI)    12/2011  . S/P AVR (aortic valve  replacement) 01/16/2012   [facility] Ease 23mm pericardial tissue valve    Assessment/Plan: 2 Days Post-Op Procedure(s) (LRB): TOTAL KNEE ARTHROPLASTY (Right) Active Problems:   OA (osteoarthritis) of knee  Estimated body mass index is 32.89 kg/m as calculated from the following:   Height as of this encounter: 5\' 7"  (1.702 m).   Weight as of this encounter: 95.3 kg. Advance diet Up with therapy D/C IV fluids  DVT Prophylaxis - Xarelto Weight-Bearing as tolerated   Continue therapy today. Plan for DC home. Will start outpatient therapy Friday. Follow up in office in 2 weeks. Discharge instructions given.   Ardeen Jourdain, PA-C Orthopaedic Surgery 06/24/2019, 7:56 AM

## 2019-06-24 NOTE — Progress Notes (Addendum)
Physical Therapy Treatment Patient Details Name: Andrew Bonilla MRN: [id number] DOB: 11-02-1932 Today's Date: 06/24/2019    History of Present Illness s/p R TKA; PMH: L TKA, AVR    PT Comments    Pt ambulated 100' with RW, then during stair training became dizzy and unresponsive. +4 assist to descend stairs to recliner. BP in supine after vagal response was 120/53, HR 62. Assessed orthostatics prior to ambulation, pt was asymptomatic (BP supine 128/51, sit 134/61, stand 116/61). Pt/wife report pt had several days of orthostatic hypotension with hospital readmission after L TKA in 2019.    Follow Up Recommendations  Follow surgeon's recommendation for DC plan and follow-up therapies     Equipment Recommendations  None recommended by PT    Recommendations for Other Services       Precautions / Restrictions Precautions Precautions: Knee;Fall Required Braces or Orthoses: Knee Immobilizer - Right Restrictions Weight Bearing Restrictions: No Other Position/Activity Restrictions: WBAT    Mobility  Bed Mobility Overal bed mobility: Needs Assistance Bed Mobility: Supine to Sit     Supine to sit: Min assist Sit to supine: Mod assist   General bed mobility comments: increased time required; required min A for R LE on/off bed and to raise trunk  Transfers Overall transfer level: Needs assistance Equipment used: Rolling walker (2 wheeled) Transfers: Sit to/from Stand Sit to Stand: Mod assist         General transfer comment: verbal cues for safety and hand placement and R LE forward for pain control  Ambulation/Gait Ambulation/Gait assistance: Min guard(unable due to orthostatic hypotension) Gait Distance (Feet): 100 Feet Assistive device: Rolling walker (2 wheeled) Gait Pattern/deviations: Step-to pattern;Decreased step length - right;Decreased step length - left     General Gait Details: VCs sequencing   Stairs Stairs: Yes Stairs assistance: Mod assist Stair  Management: One rail Left;Forwards Number of Stairs: 2 General stair comments: pt became dizzy then unresponsive at top of stairs, max assist of 4 to descend to recliner   Wheelchair Mobility    Modified Rankin (Stroke Patients Only)       Balance Overall balance assessment: Needs assistance Sitting-balance support: Bilateral upper extremity supported Sitting balance-Leahy Scale: Fair     Standing balance support: Bilateral upper extremity supported Standing balance-Leahy Scale: Poor Standing balance comment: slight posterior lean; required assist when donning pants                            Cognition Arousal/Alertness: Awake/alert Behavior During Therapy: WFL for tasks assessed/performed Overall Cognitive Status: Within Functional Limits for tasks assessed                                        Exercises Total Joint Exercises Ankle Circles/Pumps: AROM;Both;10 reps Quad Sets: AROM;Right;10 reps Towel Squeeze: AROM;Both;10 reps Heel Slides: AAROM;10 reps;Right Long Arc Quad: AAROM;10 reps;Right Knee Flexion: AAROM;10 reps;Right Goniometric ROM: R knee lacks ~5 ext; 45 degrees flex    General Comments General comments (skin integrity, edema, etc.): After +4 assist to recliner, BP 120/53 in reclined position, HR 62      Pertinent Vitals/Pain Pain Assessment: No/denies pain(no pain rest; did grimace with ther ex but states just sore) Pain Score: 5  Pain Location: R knee Pain Descriptors / Indicators: Sore Pain Intervention(s): Limited activity within patient's tolerance;Monitored during session;Ice applied    Home Living  Family/patient expects to be discharged to:: Private residence Living Arrangements: Spouse/significant other                  Prior Function            PT Goals (current goals can now be found in the care plan section) Acute Rehab PT Goals PT Goal Formulation: With patient/family Time For Goal Achievement:  06/29/19 Potential to Achieve Goals: Good Progress towards PT goals: Not progressing toward goals - comment(vagal response)    Frequency    7X/week      PT Plan Current plan remains appropriate    Co-evaluation              AM-PAC PT "6 Clicks" Mobility   Outcome Measure  Help needed turning from your back to your side while in a flat bed without using bedrails?: A Little Help needed moving from lying on your back to sitting on the side of a flat bed without using bedrails?: A Little Help needed moving to and from a bed to a chair (including a wheelchair)?: A Lot Help needed standing up from a chair using your arms (e.g., wheelchair or bedside chair)?: A Lot Help needed to walk in hospital room?: A Lot Help needed climbing 3-5 steps with a railing? : Total 6 Click Score: 13    End of Session Equipment Utilized During Treatment: Gait belt;Right knee immobilizer Activity Tolerance: Treatment limited secondary to medical complications (Comment)(vagal response) Patient left: with call bell/phone within reach;in chair;with family/visitor present Nurse Communication: Mobility status;Other (comment)(BP) PT Visit Diagnosis: Difficulty in walking, not elsewhere classified (R26.2)     Time: [id number] PT Time Calculation (min) (ACUTE ONLY): 36 min  Charges:  $Gait Training: 8-22 mins  $Therapeutic Activity: 8-22 mins                     Blondell Reveal Kistler PT 06/24/2019  Acute Rehabilitation Services Pager [phone] Office [phone]

## 2019-06-24 NOTE — Plan of Care (Signed)

## 2019-06-24 NOTE — Progress Notes (Addendum)
Pt was in the bathroom with the nurse tech. Had a bowel movement. Pt became limp, diaphoretic, and hypotensive. Pt was moved to recliner chair, legs elevated. Pt awake and oriented. Wife at bedside. Blood pressure WNL at this time.  Estill Bamberg Ward PA notified.

## 2019-06-24 NOTE — Consult Note (Signed)
Medical Consultation   Andrew Bonilla  [id number]  DOB: 1932/09/24  DOA: 06/22/2019  PCP: Wenda Low, MD   Outpatient Specialists: Dr. Prescott Gum (cardiothoracic surgery)   Requesting physician: Cleta Alberts, PA; Dr. Wynelle Link   Reason for consultation: Syncope    History of Present Illness: Andrew Bonilla is an 85 y.o. male with history of hypertension, aortic valve replacement, hyperlipidemia, syncope, and osteoarthritis status post right total knee arthroplasty on 06/22/2019, now with recurrent syncope.  Patient underwent right total knee replacement on 06/22/2019 without any immediate complications and with minimal EBL.  Patient reports that he was working with PT this morning when he began to experience "excruciating" knee pain with the exercises, became diaphoretic, felt as though he was going to pass out, was noted to be hypotensive, and was helped back into his bed where he recovered fairly quickly.  There was a second episode this evening when the patient was straining to move his bowels, reports that he had been constipated for several days, and this was followed by diaphoresis, lightheadedness, and the patient was noted to be pale and hypotensive.  He was again helped back into his bed, legs were raised, and he recovered quickly.  Patient now reports that he feels "great" and denies any chest pain or palpitations associated with these episodes.  Patient underwent a left total knee replacement 1 year ago, and he had multiple syncopal episodes in the postoperative period in that instance as well.  He was admitted for syncope 1 year ago, underwent extensive work-up including discussion with neurologist and formal cardiology consultation.  Etiology at that time was felt to be orthostasis.    Review of Systems:  ROS As per HPI otherwise 10 point review of systems negative.   Past Medical History: Past Medical History:  Diagnosis Date  . Arthritis   . Constipation    . Deaf, left    WEARS HEARING AID RIGHT EAR   . GERD (gastroesophageal reflux disease)   . Hard of hearing   . Heart murmur    mod-severe aortic stenosis   . Hyperlipemia   . Hypertension   . Pneumonia    in college  . Prostate enlargement   . Recurrent upper respiratory infection (URI)    12/2011  . S/P AVR (aortic valve replacement) 01/16/2012   [facility] Ease 7mm pericardial tissue valve    Past Surgical History: Past Surgical History:  Procedure Laterality Date  . AORTIC VALVE REPLACEMENT  01/17/2012   Procedure: AORTIC VALVE REPLACEMENT (AVR);  Surgeon: Ivin Poot, MD;  Location: [facility];  Service: Open Heart Surgery;  Laterality: N/A;  . CARDIAC CATHETERIZATION  12/17/2011   mild nonobstructive CAD - 20% LAD stenosis, 30% OM stenosis, 20% Cfx, 20% RCA lesion  . JOINT REPLACEMENT     Left total knee Dr. Wynelle Link 06-09-18  . LEFT HEART CATHETERIZATION WITH CORONARY ANGIOGRAM N/A 12/17/2011   Procedure: LEFT HEART CATHETERIZATION WITH CORONARY ANGIOGRAM;  Surgeon: Pixie Casino, MD;  Location: [facility] CATH LAB;  Service: Cardiovascular;  Laterality: N/A;  . TEE WITHOUT CARDIOVERSION  01/2012   EF 50-55%; calcified AV annulus, severely calcified AV leaflets, mild regurg (AVR)  . TOTAL KNEE ARTHROPLASTY Left 06/09/2018   Procedure: LEFT TOTAL KNEE ARTHROPLASTY;  Surgeon: Gaynelle Arabian, MD;  Location: [facility];  Service: Orthopedics;  Laterality: Left;  . TOTAL KNEE ARTHROPLASTY Right 06/22/2019   Procedure: TOTAL  KNEE ARTHROPLASTY;  Surgeon: Gaynelle Arabian, MD;  Location: [facility];  Service: Orthopedics;  Laterality: Right;  55min     Allergies:  No Known Allergies   Social History:  reports that he has never smoked. He has never used smokeless tobacco. He reports previous alcohol use of about 1.0 - 2.0 standard drinks of alcohol per week. He reports that he does not use drugs.   Family History: Family History  Problem Relation Age of Onset  . Coronary artery disease  Father 14       MI/smoker  . Colon cancer Brother   . Ovarian cancer Sister   . COPD Sister      Physical Exam: Vitals:   06/24/19 1340 06/24/19 1815 06/24/19 1819 06/24/19 1920  BP: 120/67 (!) 84/74 (!) 157/61 (!) 162/101  Pulse: 60 63 60 68  Resp: 14   19  Temp:    97.6 F (36.4 C)  TempSrc:    Oral  SpO2: 100%   98%  Weight:      Height:        Constitutional: Alert and awake, oriented x3, not in any acute distress. Eyes: PERLA, EOMI, irises appear normal, anicteric sclera,  ENMT: external ears and nose appear normal. Lips appears normal, oropharynx mucosa, tongue  Neck: neck appears normal, no masses, normal ROM, no thyromegaly, no JVD  CVS: S1-S2, regular rate and rhythm. No LE edema  Respiratory:  clear to auscultation bilaterally, no wheezing, rales or rhonchi. Respiratory effort normal. No accessory muscle use.  Abdomen: soft nontender, nondistended, normal bowel sounds  Musculoskeletal: : no cyanosis, clubbing or edema noted bilaterally Neuro: No facial asymmetry, gross hearing deficit. Strength, sensation, reflexes intact  Psych: judgement and insight appear normal, stable mood and affect, mental status Skin: no rashes or lesions or ulcers, no induration or nodules    Data reviewed:  I have personally reviewed following labs and imaging studies Labs:  CBC: Recent Labs  Lab 06/23/19 0329 06/24/19 0311  WBC 11.8* 13.5*  HGB 10.5* 10.5*  HCT 31.0* 31.1*  MCV 91.4 90.7  PLT 218 [id number]    Basic Metabolic Panel: Recent Labs  Lab 06/23/19 0329 06/24/19 0311  NA 130* 129*  K 4.2 4.2  CL 99 96*  CO2 24 24  GLUCOSE 129* 106*  BUN 12 15  CREATININE 0.62 0.70  CALCIUM 8.4* 8.7*   GFR Estimated Creatinine Clearance: 75.6 mL/min (by C-G formula based on SCr of 0.7 mg/dL). Liver Function Tests: No results for input(s): AST, ALT, ALKPHOS, BILITOT, PROT, ALBUMIN in the last 168 hours. No results for input(s): LIPASE, AMYLASE in the last 168 hours. No results  for input(s): AMMONIA in the last 168 hours. Coagulation profile No results for input(s): INR, PROTIME in the last 168 hours.  Cardiac Enzymes: No results for input(s): CKTOTAL, CKMB, CKMBINDEX, TROPONINI in the last 168 hours. BNP: Invalid input(s): POCBNP CBG: No results for input(s): GLUCAP in the last 168 hours. D-Dimer No results for input(s): DDIMER in the last 72 hours. Hgb A1c No results for input(s): HGBA1C in the last 72 hours. Lipid Profile No results for input(s): CHOL, HDL, LDLCALC, TRIG, CHOLHDL, LDLDIRECT in the last 72 hours. Thyroid function studies No results for input(s): TSH, T4TOTAL, T3FREE, THYROIDAB in the last 72 hours.  Invalid input(s): FREET3 Anemia work up No results for input(s): VITAMINB12, FOLATE, FERRITIN, TIBC, IRON, RETICCTPCT in the last 72 hours. Urinalysis    Component Value Date/Time   COLORURINE YELLOW 06/13/2018 1831  APPEARANCEUR CLEAR 06/13/2018 1831   LABSPEC 1.016 06/13/2018 1831   PHURINE 7.0 06/13/2018 1831   GLUCOSEU NEGATIVE 06/13/2018 1831   HGBUR NEGATIVE 06/13/2018 1831   BILIRUBINUR NEGATIVE 06/13/2018 1831   KETONESUR NEGATIVE 06/13/2018 1831   PROTEINUR NEGATIVE 06/13/2018 1831   UROBILINOGEN 0.2 01/14/2012 1529   NITRITE NEGATIVE 06/13/2018 1831   [place] 06/13/2018 1831     Microbiology Recent Results (from the past 240 hour(s))  Surgical pcr screen     Status: None   Collection Time: 06/15/19 11:24 AM   Specimen: Nasal Mucosa; Nasal Swab  Result Value Ref Range Status   MRSA, PCR NEGATIVE NEGATIVE Final   Staphylococcus aureus NEGATIVE NEGATIVE Final    Comment: (NOTE) The Xpert SA Assay (FDA approved for NASAL specimens in patients 32 years of age and older), is one component of a comprehensive surveillance program. It is not intended to diagnose infection nor to guide or monitor treatment. Performed at [facility], [address], [place], [place] [address]   Novel  Coronavirus, NAA [facility] order, Send-out to Ref Lab; TAT 18-24 hrs     Status: None   Collection Time: 06/18/19  3:39 PM   Specimen: Nasopharyngeal Swab; Respiratory  Result Value Ref Range Status   SARS-CoV-2, NAA NOT DETECTED NOT DETECTED Final    Comment: (NOTE) This nucleic acid amplification test was developed and its performance characteristics determined by [employer]. Nucleic acid amplification tests include PCR and TMA. This test has not been FDA cleared or approved. This test has been authorized by FDA under an Emergency Use Authorization (EUA). This test is only authorized for the duration of time the declaration that circumstances exist justifying the authorization of the emergency use of in vitro diagnostic tests for detection of SARS-CoV-2 virus and/or diagnosis of COVID-19 infection under section 564(b)(1) of the Act, 21 U.S.C. [id number]) (1), unless the authorization is terminated or revoked sooner. When diagnostic testing is negative, the possibility of a false negative result should be considered in the context of a patient's recent exposures and the presence of clinical signs and symptoms consistent with COVID-19. An individual without symptoms of COVID- 19 and who is not shedding SARS-CoV-2 vi rus would expect to have a negative (not detected) result in this assay. Performed At: [facility] [address] [place], [place] [id number] Rush Farmer MD [id number]    [place]  Final    Comment: Performed at [facility], [place] [address], [place], [place] [address]       Inpatient Medications:   Scheduled Meds: . atorvastatin  40 mg Oral Daily  . docusate sodium  100 mg Oral BID  . irbesartan  75 mg Oral Daily  . pantoprazole  80 mg Oral Q1200  . rivaroxaban  10 mg Oral Q breakfast   Continuous Infusions: . sodium chloride 100 mL/hr at 06/24/19 1329  . sodium chloride    . methocarbamol (ROBAXIN) IV        Radiological Exams on Admission: No results found.  Impression/Recommendations  1. Recurrent syncope  - Patient had 2 syncopal episodes 10/21, first preceded by severe knee pain while working with PT and involved diaphoresis and hypotension, and the second occurred while straining to move bowels and was again associated with diaphoresis and hypotension  - Orthostatic vitals with 12 mmHg drop in SBP and 15 bpm increase in HR on standing - He has history of bioprosthetic AVR in 2013, functioning well with minimal gradient  and no leak on echo one year ago - EKG with sinus arrhythmia  - Overall, history most consistent with neurally-mediated syncope triggered by pain and straining to move bowels, though hypovolemia & orthostasis may be contributing  - Recommend cardiac monitoring overnight, IVF hydration with NS, echocardiogram, and consider pain medication prior to PT   2. Hyponatremia  - Serum sodium is 129 today, chronically in low 130's, stopped HCTZ a year ago  - He appears hypovolemic  - Hydrate overnight with NS infusion and repeat chem panel in am   3. OA s/p right total knee replacement 10/19  - Pain is well-controlled currently - His work with PT has been complicated by pain and syncope  - He is on Xarelto for ppx  - Plans to d/c to home    Thank you for this consultation.  Our [facility] hospitalist team will follow the patient with you.   Time Spent: 47 minutes.   Ilene Qua Olando Willems M.D. [facility] 06/24/2019, 7:57 PM

## 2019-06-25 ENCOUNTER — Ambulatory Visit (HOSPITAL_COMMUNITY): Payer: Medicare Other

## 2019-06-25 DIAGNOSIS — I10 Essential (primary) hypertension: Secondary | ICD-10-CM

## 2019-06-25 DIAGNOSIS — M1711 Unilateral primary osteoarthritis, right knee: Secondary | ICD-10-CM | POA: Diagnosis not present

## 2019-06-25 DIAGNOSIS — K219 Gastro-esophageal reflux disease without esophagitis: Secondary | ICD-10-CM | POA: Diagnosis not present

## 2019-06-25 DIAGNOSIS — E871 Hypo-osmolality and hyponatremia: Secondary | ICD-10-CM | POA: Diagnosis not present

## 2019-06-25 DIAGNOSIS — E785 Hyperlipidemia, unspecified: Secondary | ICD-10-CM | POA: Diagnosis not present

## 2019-06-25 DIAGNOSIS — Z952 Presence of prosthetic heart valve: Secondary | ICD-10-CM | POA: Diagnosis not present

## 2019-06-25 DIAGNOSIS — M170 Bilateral primary osteoarthritis of knee: Secondary | ICD-10-CM | POA: Diagnosis not present

## 2019-06-25 LAB — CBC
HCT: 26.6 % — ABNORMAL LOW (ref 39.0–52.0)
Hemoglobin: 9.2 g/dL — ABNORMAL LOW (ref 13.0–17.0)
MCH: 32.1 pg (ref 26.0–34.0)
MCHC: 34.6 g/dL (ref 30.0–36.0)
MCV: 92.7 fL (ref 80.0–100.0)
Platelets: 210 10*3/uL (ref 150–400)
RBC: 2.87 MIL/uL — ABNORMAL LOW (ref 4.22–5.81)
RDW: 13 % (ref 11.5–15.5)
WBC: 11.8 10*3/uL — ABNORMAL HIGH (ref 4.0–10.5)
nRBC: 0 % (ref 0.0–0.2)

## 2019-06-25 LAB — BASIC METABOLIC PANEL
Anion gap: 9 (ref 5–15)
Anion gap: 9 (ref 5–15)
BUN: 14 mg/dL (ref 8–23)
BUN: 15 mg/dL (ref 8–23)
CO2: 24 mmol/L (ref 22–32)
CO2: 23 mmol/L (ref 22–32)
Calcium: 8.3 mg/dL — ABNORMAL LOW (ref 8.9–10.3)
Calcium: 8.2 mg/dL — ABNORMAL LOW (ref 8.9–10.3)
Chloride: 97 mmol/L — ABNORMAL LOW (ref 98–111)
Chloride: 96 mmol/L — ABNORMAL LOW (ref 98–111)
Creatinine, Ser: 0.68 mg/dL (ref 0.61–1.24)
Creatinine, Ser: 0.7 mg/dL (ref 0.61–1.24)
GFR calc Af Amer: >60 mL/min (ref >60)
GFR calc Af Amer: >60 mL/min (ref >60)
GFR calc non Af Amer: >60 mL/min (ref >60)
GFR calc non Af Amer: >60 mL/min (ref >60)
Glucose, Bld: 97 mg/dL (ref 70–99)
Glucose, Bld: 123 mg/dL — ABNORMAL HIGH (ref 70–99)
Potassium: 4 mmol/L (ref 3.5–5.1)
Potassium: 3.9 mmol/L (ref 3.5–5.1)
Sodium: 130 mmol/L — ABNORMAL LOW (ref 135–145)
Sodium: 128 mmol/L — ABNORMAL LOW (ref 135–145)

## 2019-06-25 LAB — PHOSPHORUS: Phosphorus: 3.1 mg/dL (ref 2.5–4.6)

## 2019-06-25 LAB — MAGNESIUM: Magnesium: 1.8 mg/dL (ref 1.7–2.4)

## 2019-06-25 MED ORDER — COSYNTROPIN 0.25 MG IJ SOLR
0.2500 mg | Freq: Once | INTRAMUSCULAR | Status: AC
  Administered 2019-06-26: 0.25 mg via INTRAVENOUS
  Filled 2019-06-25: qty 0.25

## 2019-06-25 MED ORDER — SODIUM CHLORIDE 0.9 % IV BOLUS
1000.0000 mL | Freq: Once | INTRAVENOUS | Status: AC
  Administered 2019-06-25: 1000 mL via INTRAVENOUS

## 2019-06-25 NOTE — Progress Notes (Signed)
Patient had an additional episode of dizziness, diaphoresis with standing.  Vitals taken.  MD notified.  Virginia Rochester, RN

## 2019-06-25 NOTE — Progress Notes (Signed)
Physical Therapy Treatment Patient Details Name: Andrew Bonilla MRN: [id number] DOB: Aug 16, 1933 Today's Date: 06/25/2019    History of Present Illness s/p R TKA; multiple episodes orthostatic hypotension with activity; had episode this am, just completed fluid bolus    PT Comments    Pt continues to be limited by orthostatic hypotension despite just receiving fluid bolus prior to PT.  He is very determined and motivated, needed cues for safety related to hypotension.  Knee ROM limited due to pain.  Cont POC.    Follow Up Recommendations  Follow surgeon's recommendation for DC plan and follow-up therapies     Equipment Recommendations       Recommendations for Other Services       Precautions / Restrictions Precautions Precautions: Knee;Fall Knee Immobilizer - Right: Discontinue once straight leg raise with < 10 degree lag Restrictions Other Position/Activity Restrictions: WBAT    Mobility  Bed Mobility Overal bed mobility: Needs Assistance Bed Mobility: Supine to Sit     Supine to sit: Min assist     General bed mobility comments: increased time required; required min A for R LE on/off bed and to raise trunk  Transfers Overall transfer level: Needs assistance Equipment used: Rolling walker (2 wheeled) Transfers: Sit to/from Stand Sit to Stand: Min assist;+2 physical assistance         General transfer comment: verbal cues for safety and hand placement and R LE forward for pain control; bed elevated; 2 assist due to orthostatic hypotension  Ambulation/Gait Ambulation/Gait assistance: Min guard;+2 safety/equipment Gait Distance (Feet): 120 Feet Assistive device: Rolling walker (2 wheeled) Gait Pattern/deviations: Step-to pattern;Decreased stride length;Antalgic Gait velocity: decreased   General Gait Details: chair follow for safety; VCs for RW   Stairs         General stair comments: did not attempt stairs - pt orthostatic   Wheelchair Mobility     Modified Rankin (Stroke Patients Only)       Balance Overall balance assessment: Needs assistance Sitting-balance support: Bilateral upper extremity supported Sitting balance-Leahy Scale: Good     Standing balance support: Bilateral upper extremity supported Standing balance-Leahy Scale: Fair                              Cognition Arousal/Alertness: Awake/alert Behavior During Therapy: WFL for tasks assessed/performed Overall Cognitive Status: Within Functional Limits for tasks assessed                                 General Comments: Needs reinforcement of decreased safety associated with orthostatic hypotension      Exercises Total Joint Exercises Ankle Circles/Pumps: AROM;Both;10 reps Quad Sets: AROM;Right;10 reps Towel Squeeze: AROM;Both;10 reps Short Arc QuadSinclair Ship;Right;10 reps Heel Slides: AAROM;10 reps;Right Hip ABduction/ADduction: AAROM;Right;10 reps Straight Leg Raises: 10 reps;Right;AAROM Goniometric ROM: R knee lacks ~5 ext; 45 flex - limited by pain    General Comments General comments (skin integrity, edema, etc.): Pts BP supine 158/73; Sitting 193/80; standing 163/72 (asymptomatic of drop); standing after 3 mins and walking 111/53 (pt denies symptoms but was pale and diaphoretic).  Reclined in chair and performed ther ex after resting.  BP in chair 162/62.  HR 75-90 bpm throughout      Pertinent Vitals/Pain Pain Score: 2  Pain Location: R knee Pain Descriptors / Indicators: Aching;Sore Pain Intervention(s): RN gave pain meds during session;Ice applied    Home  Living                      Prior Function            PT Goals (current goals can now be found in the care plan section) Progress towards PT goals: Progressing toward goals    Frequency    7X/week      PT Plan Current plan remains appropriate    Co-evaluation              AM-PAC PT "6 Clicks" Mobility   Outcome Measure  Help needed  turning from your back to your side while in a flat bed without using bedrails?: A Little Help needed moving from lying on your back to sitting on the side of a flat bed without using bedrails?: A Little Help needed moving to and from a bed to a chair (including a wheelchair)?: A Little Help needed standing up from a chair using your arms (e.g., wheelchair or bedside chair)?: A Lot Help needed to walk in hospital room?: A Lot Help needed climbing 3-5 steps with a railing? : A Lot 6 Click Score: 15    End of Session Equipment Utilized During Treatment: Gait belt Activity Tolerance: Treatment limited secondary to medical complications (Comment) Patient left: with call bell/phone within reach;in chair;with family/visitor present Nurse Communication: Mobility status;Other (comment)(orthostatic BP) PT Visit Diagnosis: Difficulty in walking, not elsewhere classified (R26.2);Muscle weakness (generalized) (M62.81);Unsteadiness on feet (R26.81)     Time: [id number] PT Time Calculation (min) (ACUTE ONLY): 48 min  Charges:  $Gait Training: 8-22 mins $Therapeutic Exercise: 8-22 mins $Therapeutic Activity: 8-22 mins                    Maggie Font, PT Acute Rehab Services [phone]    Andrew Bonilla 06/25/2019, 4:36 PM

## 2019-06-25 NOTE — Progress Notes (Addendum)
PROGRESS NOTE  DEMETRICS UREN [id number] DOB: [birth year] DOA: 06/22/2019 PCP: Wenda Low, MD  Brief History   KARTIER ATER is an 85 y.o. male with history of hypertension, aortic valve replacement, hyperlipidemia, syncope, and osteoarthritis status post right total knee arthroplasty on 06/22/2019, now with recurrent syncope.  Patient underwent right total knee replacement on 06/22/2019 without any immediate complications and with minimal EBL.  Patient reports that he was working with PT this morning when he began to experience "excruciating" knee pain with the exercises, became diaphoretic, felt as though he was going to pass out, was noted to be hypotensive, and was helped back into his bed where he recovered fairly quickly.  There was a second episode this evening when the patient was straining to move his bowels, reports that he had been constipated for several days, and this was followed by diaphoresis, lightheadedness, and the patient was noted to be pale and hypotensive.  He was again helped back into his bed, legs were raised, and he recovered quickly.  Patient now reports that he feels "great" and denies any chest pain or palpitations associated with these episodes.  Patient underwent a left total knee replacement 1 year ago, and he had multiple syncopal episodes in the postoperative period in that instance as well.  He was admitted for syncope 1 year ago, underwent extensive work-up including discussion with neurologist and formal cardiology consultation.  Etiology at that time was felt to be orthostasis.   Although the patient stated that she was feeling very well this morning, her orthostatics were significantly positive this morning.  Consultants  . Hospitalist medicine  Procedures  . Left TKR  Antibiotics   Anti-infectives (From admission, onward)   Start     Dose/Rate Route Frequency Ordered Stop   06/22/19 1400  ceFAZolin (ANCEF) IVPB 2g/100 mL premix     2 g 200  mL/hr over 30 Minutes Intravenous Every 6 hours 06/22/19 1041 06/22/19 2023   06/22/19 0630  ceFAZolin (ANCEF) IVPB 2g/100 mL premix     2 g 200 mL/hr over 30 Minutes Intravenous On call to O.R. 06/22/19 [id number] 06/22/19 [id number]    .  Interval History/Subjective  The patient is resting comfortably. No new complaints. His orthostatics were positive this morning, and the patient was symptomatic with standing.  Objective   Vitals:  Vitals:   06/25/19 0946 06/25/19 1000  BP: 138/68 (!) 156/59  Pulse: (!) 120   Resp:    Temp:    SpO2:     Exam:  Constitutional:  . The patient is awake, alert, and oriented x 3. No acute distress. Respiratory:  . No increased work of breathing. . No wheezes, rales, or rhonchi . No tactile fremitus Cardiovascular:  . Regular rate and rhythm . No murmurs, ectopy, or gallups. . No lateral PMI. No thrills. Abdomen:  . Abdomen is soft, non-tender, non-distended . No hernias, masses, or organomegaly . Normoactive bowel sounds.  Musculoskeletal:  . No cyanosis, clubbing, or edema Skin:  . No rashes, lesions, ulcers . palpation of skin: no induration or nodules Neurologic:  . CN 2-12 intact . Sensation all 4 extremities intact Psychiatric:  . Mental status o Mood, affect appropriate o Orientation to person, place, time  . judgment and insight appear intact  I have personally reviewed the following:   Today's Data  . Vitals, BMP, CBC  Scheduled Meds: . atorvastatin  40 mg Oral Daily  . docusate sodium  100 mg Oral BID  .  irbesartan  75 mg Oral Daily  . pantoprazole  80 mg Oral Q1200  . rivaroxaban  10 mg Oral Q breakfast   Continuous Infusions: . methocarbamol (ROBAXIN) IV      Principal Problem:   OA (osteoarthritis) of knee Active Problems:   Essential hypertension   S/P AVR (aortic valve replacement)   Syncope   Hyponatremia   LOS: 0 days   A & P  Recurrent syncope:  Patient had 2 syncopal episodes 10/21, first preceded by  severe knee pain while working with PT and involved diaphoresis and hypotension, and the second occurred while straining to move bowels and was again associated with diaphoresis and hypotension. Orthostatic vitals with strongly positive again this morning with symptoms. 1L Bolus of NS given. Will recheck orthostatics in the am as well as check a cosyntropin stim test. The patient has a history of bioprosthetic AVR in 2013, functioning well with minimal gradient and no leak on echo one year ago. EKG demonstrates only sinus arrhythmia. Overall, history most consistent with neurally-mediated syncope triggered by pain and straining to move bowels in the setting of significant orthostatic hypotension. , though hypovolemia & orthostasis may be contributing   Hyponatremia: Likely due to pain in the setting of hypovolemia. HCTZ has been held, and the patient is receiving a 1 L bolus. Will check cosyntropin stim test in the morning.  OA s/p right total knee replacement 10/19: Pain is well-controlled currently, although the patient complains of severe pain with attempting stairs.His work with PT has been complicated by pain and syncope.  I have seen and examined this patient myself. I have spent 34 minutes in his evaluation and care.  DVT Prophylaxis: Xarelto CODE STATUS: Full Code Family Communication: None at bedside Disposition: Plans to d/c to home   Urias Sheek, DO [facility] Direct contact: see [email or web address]  7PM-7AM contact night coverage as above 06/25/2019, 3:23 PM  LOS: 0 days   ADDENDUM: Pt had syncopal episode at around 5:30 per nursing. Pt was sitting on the bed side commode. It appears to have been a vasovagal event. EKG is non-revealing as was telemetry. Will bolus patient again and follow orthostatics. Check cosyntropin stim test in the morning.

## 2019-06-25 NOTE — Progress Notes (Signed)
Patient became non-responsive while on the [facility].  Staff assisted patient back to bed, called rapid response.  Patient became alert after a few minutes.   MD notified.  Wife at bedside.  Virginia Rochester, RN

## 2019-06-25 NOTE — Significant Event (Signed)
Rapid Response Event Note  Overview: Time Called: 1700 Arrival Time: 1703 Event Type: Hypotension  Initial Focused Assessment:  RRT called due to possible vagal response on ambulating to bedside commode. Patient assisted back to bed and placed in trendelenburg position. Bedside RN reports patient wasn't responding and was grayish in color. Chart reviewed patient noted to have had positive orthostatic vital signs this morning and had a bolus given after. RN reports patient has had syncopal/ vagal responses most times he has stood sincve operation. patietn being worked up for etiology. Patient also had similar issues in the past after his previous knee replacement surgery. His wife at the bedside stated he saw a cardiologist to be worked up for this.  Patient now alert and pale but appropriate for ethnicity in color. Labs reviewed Hgb stable, sodium noted to be low, contributed to hypovolemia in MD note. MD paged to notify of an additional event. Patients vital signs stable and expresses feelings of normalcy now. RRT available as needed.   Event Summary: Name of Physician Notified: Swayze, A. at 1600    at    Outcome: Stayed in room and stabalized  Event End Time: [place]

## 2019-06-25 NOTE — Progress Notes (Signed)
   Subjective: 3 Days Post-Op Procedure(s) (LRB): TOTAL KNEE ARTHROPLASTY (Right) Patient reports pain as mild.   Patient seen in rounds with Dr. Wynelle Link. Patient is well, and has had no acute complaints or problems this morning. Reports that he is feeling much better this morning. No SOB or chest pain. No dizziness at this time. Voiding well. Positive BM. No syncopal episodes overnight.  Plan is to go Home after hospital stay.  Objective: Vital signs in last 24 hours: Temp:  [97.6 F (36.4 C)-98.7 F (37.1 C)] 98.7 F (37.1 C) (10/22 0611) Pulse Rate:  [60-90] 90 (10/22 0611) Resp:  [14-19] 16 (10/22 0611) BP: (84-167)/(51-114) 146/61 (10/22 0611) SpO2:  [97 %-100 %] 97 % (10/22 0611)  Intake/Output from previous day:  Intake/Output Summary (Last 24 hours) at 06/25/2019 0745 Last data filed at 06/25/2019 0600 Gross per 24 hour  Intake 1055.27 ml  Output 1000 ml  Net 55.27 ml     Labs: Recent Labs    06/23/19 0329 06/24/19 0311 06/25/19 0608  HGB 10.5* 10.5* 9.2*   Recent Labs    06/24/19 0311 06/25/19 0608  WBC 13.5* 11.8*  RBC 3.43* 2.87*  HCT 31.1* 26.6*  PLT 232 210   Recent Labs    06/24/19 0311 06/25/19 0608  NA 129* 130*  K 4.2 4.0  CL 96* 97*  CO2 24 24  BUN 15 14  CREATININE 0.70 0.68  GLUCOSE 106* 97  CALCIUM 8.7* 8.3*    EXAM General - Patient is Alert and Oriented Extremity - Neurologically intact Intact pulses distally Dorsiflexion/Plantar flexion intact No cellulitis present Compartment soft Dressing/Incision - clean, dry, no drainage Motor Function - intact, moving foot and toes well on exam.   Past Medical History:  Diagnosis Date  . Arthritis   . Constipation   . Deaf, left    WEARS HEARING AID RIGHT EAR   . GERD (gastroesophageal reflux disease)   . Hard of hearing   . Heart murmur    mod-severe aortic stenosis   . Hyperlipemia   . Hypertension   . Pneumonia    in college  . Prostate enlargement   . Recurrent  upper respiratory infection (URI)    12/2011  . S/P AVR (aortic valve replacement) 01/16/2012   [facility] Ease 61mm pericardial tissue valve    Assessment/Plan: 3 Days Post-Op Procedure(s) (LRB): TOTAL KNEE ARTHROPLASTY (Right) Principal Problem:   OA (osteoarthritis) of knee Active Problems:   Essential hypertension   S/P AVR (aortic valve replacement)   Syncope   Hyponatremia  Estimated body mass index is 32.89 kg/m as calculated from the following:   Height as of this encounter: 5\' 7"  (1.702 m).   Weight as of this encounter: 95.3 kg. Advance diet Up with therapy  DVT Prophylaxis - Xarelto Weight-Bearing as tolerated   Continue therapy today. Will DC home if he is stable and cleared by medicine. Will have HHPT.   Ardeen Jourdain, PA-C Orthopaedic Surgery 06/25/2019, 7:45 AM

## 2019-06-25 NOTE — Progress Notes (Signed)
2300 - pt has arrived from 3rd floor in his hosp bed. Denies pain, oriented to this room , call bell within reach, orders noted

## 2019-06-25 NOTE — Progress Notes (Signed)
PT Cancellation Note  Patient Details Name: CLENDON LINDEMANN MRN: [id number] DOB: 1933/03/01   Cancelled Treatment:    Reason Eval/Treat Not Completed: Patient not medically ready;Other (comment)(Spoke with RN who reports pt had another episode of hypotension and she is starting a fluid bolus.  Will f/u in pm after bolus.)  Maggie Font, PT Acute Rehab Services [phone]  Karlton Lemon 06/25/2019, 12:06 PM

## 2019-06-26 ENCOUNTER — Ambulatory Visit (HOSPITAL_BASED_OUTPATIENT_CLINIC_OR_DEPARTMENT_OTHER): Payer: Medicare Other

## 2019-06-26 ENCOUNTER — Encounter: Payer: Self-pay | Admitting: Cardiothoracic Surgery

## 2019-06-26 DIAGNOSIS — E871 Hypo-osmolality and hyponatremia: Secondary | ICD-10-CM | POA: Diagnosis not present

## 2019-06-26 DIAGNOSIS — M1711 Unilateral primary osteoarthritis, right knee: Secondary | ICD-10-CM

## 2019-06-26 DIAGNOSIS — R55 Syncope and collapse: Secondary | ICD-10-CM

## 2019-06-26 DIAGNOSIS — I10 Essential (primary) hypertension: Secondary | ICD-10-CM | POA: Diagnosis not present

## 2019-06-26 DIAGNOSIS — Z952 Presence of prosthetic heart valve: Secondary | ICD-10-CM | POA: Diagnosis not present

## 2019-06-26 LAB — BASIC METABOLIC PANEL
Anion gap: 9 (ref 5–15)
BUN: 13 mg/dL (ref 8–23)
CO2: 23 mmol/L (ref 22–32)
Calcium: 8.1 mg/dL — ABNORMAL LOW (ref 8.9–10.3)
Chloride: 97 mmol/L — ABNORMAL LOW (ref 98–111)
Creatinine, Ser: 0.66 mg/dL (ref 0.61–1.24)
GFR calc Af Amer: >60 mL/min (ref >60)
GFR calc non Af Amer: >60 mL/min (ref >60)
Glucose, Bld: 98 mg/dL (ref 70–99)
Potassium: 3.6 mmol/L (ref 3.5–5.1)
Sodium: 129 mmol/L — ABNORMAL LOW (ref 135–145)

## 2019-06-26 LAB — ACTH STIMULATION, 3 TIME POINTS
Cortisol, 30 Min: 26.5 ug/dL
Cortisol, 60 Min: 29.6 ug/dL
Cortisol, Base: 14.1 ug/dL

## 2019-06-26 LAB — ECHOCARDIOGRAM COMPLETE
Height: 67 in
Weight: 3360 oz

## 2019-06-26 MED ORDER — SODIUM CHLORIDE 0.9 % IV BOLUS
500.0000 mL | Freq: Once | INTRAVENOUS | Status: AC
  Administered 2019-06-26: 500 mL via INTRAVENOUS

## 2019-06-26 NOTE — Progress Notes (Signed)
Physical Therapy Treatment Patient Details Name: Andrew Bonilla MRN: [id number] DOB: [birth year] Today's Date: 06/26/2019    History of Present Illness s/p R TKA; multiple episodes of orthostatic hypotension with activity (latest one yesterday evening); had ECHO this am (results reviewed).  Nursing just completed a set of orthostatic bps that was negative.    PT Comments    Pt was able to participate with PT today without episode of orthostatic hypotension.  He has good family support at home and they are looking into getting a w/c for safety with walking.  Pt does have limited knee ROM and quad activation associated with pain and edema.  Will cont to benefit from PT.     Follow Up Recommendations  Follow surgeon's recommendation for DC plan and follow-up therapies     Equipment Recommendations  None recommended by PT    Recommendations for Other Services       Precautions / Restrictions Precautions Precautions: Knee;Fall Precaution Comments: hx of orthostatic hyptension Knee Immobilizer - Right: Discontinue once straight leg raise with < 10 degree lag Restrictions Other Position/Activity Restrictions: WBAT    Mobility  Bed Mobility Overal bed mobility: Needs Assistance Bed Mobility: Supine to Sit     Supine to sit: Min assist Sit to supine: Min assist   General bed mobility comments: increased time required; required min A for R LE on/off bed and to raise trunk  Transfers Overall transfer level: Needs assistance Equipment used: Rolling walker (2 wheeled) Transfers: Sit to/from Stand Sit to Stand: Min assist;+2 safety/equipment         General transfer comment: verbal cues for safety and hand placement and R LE forward for pain control; bed elevated; 2 assist due to orthostatic hypotension hx  Ambulation/Gait Ambulation/Gait assistance: Min guard;+2 safety/equipment Gait Distance (Feet): 120 Feet Assistive device: Rolling walker (2 wheeled) Gait  Pattern/deviations: Step-to pattern;Decreased stride length;Antalgic Gait velocity: decreased   General Gait Details: chair follow for safety; VCs for RW   Stairs Stairs: (Declined stairs today - states 2 strong sons to assist)           Wheelchair Mobility    Modified Rankin (Stroke Patients Only)       Balance   Sitting-balance support: Single extremity supported Sitting balance-Leahy Scale: Good     Standing balance support: Bilateral upper extremity supported Standing balance-Leahy Scale: Fair                              Cognition Arousal/Alertness: Awake/alert Behavior During Therapy: WFL for tasks assessed/performed Overall Cognitive Status: Within Functional Limits for tasks assessed                                 General Comments: Needs reinforcement of decreased safety associated with orthostatic hypotension; wife present      Exercises Total Joint Exercises Ankle Circles/Pumps: AROM;Both;10 reps Quad Sets: AROM;Right;10 reps Short Arc QuadSinclair Ship;Right;10 reps Heel Slides: AAROM;10 reps;Right Hip ABduction/ADduction: AAROM;Right;10 reps Straight Leg Raises: 10 reps;Right;AAROM Goniometric ROM: R knee lacks ~5 ext; 45 flexion limited by pain   Discussed compensation techniques for exercises at home.  Discussed knee flexion with foot blocked against wall, pt in w/c and rolling forward.  Discussed ice use 20 minutes every couple hours and always after exercise.     General Comments    Orthostatic BPs  Supine 149/62  Sitting 161/77  Standing 175/82  Standing after 3 min 172/85  After walking with PT 172/81          Pertinent Vitals/Pain Pain Score: 6  Pain Location: R knee Pain Descriptors / Indicators: Aching;Sore;Cramping Pain Intervention(s): Limited activity within patient's tolerance(pain with activity; 0/10 rest)    Home Living                      Prior Function            PT Goals  (current goals can now be found in the care plan section) Progress towards PT goals: Progressing toward goals    Frequency    7X/week      PT Plan Current plan remains appropriate    Co-evaluation              AM-PAC PT "6 Clicks" Mobility   Outcome Measure  Help needed turning from your back to your side while in a flat bed without using bedrails?: A Little Help needed moving from lying on your back to sitting on the side of a flat bed without using bedrails?: A Little Help needed moving to and from a bed to a chair (including a wheelchair)?: A Little Help needed standing up from a chair using your arms (e.g., wheelchair or bedside chair)?: A Little Help needed to walk in hospital room?: A Little Help needed climbing 3-5 steps with a railing? : A Lot 6 Click Score: 17    End of Session Equipment Utilized During Treatment: Gait belt Activity Tolerance: Patient tolerated treatment well Patient left: with call bell/phone within reach;with family/visitor present;in bed Nurse Communication: Mobility status;Other (comment)(Wife asking about w/c - notified RN) PT Visit Diagnosis: Difficulty in walking, not elsewhere classified (R26.2);Muscle weakness (generalized) (M62.81);Unsteadiness on feet (R26.81)     Time: [id number] PT Time Calculation (min) (ACUTE ONLY): 40 min  Charges:  $Gait Training: 8-22 mins $Therapeutic Exercise: 8-22 mins $Therapeutic Activity: 8-22 mins                     Maggie Font, PT Acute Rehab Services [phone]    Karlton Lemon 06/26/2019, 3:29 PM

## 2019-06-26 NOTE — Discharge Summary (Signed)
Physician Discharge Summary  Andrew Bonilla [id number] DOB: 05-19-1933 DOA: 06/22/2019  PCP: Wenda Low, MD  Admit date: 06/22/2019 Discharge date: 06/26/2019  Recommendations for Outpatient Follow-up:  Dr. Gaynelle Arabian [facility] [address], [place], [place] [address] Follow up with orthopedic surgery as directed. Follow up with PCP in 7-10 days. Follow-up Information    Gaynelle Arabian, MD. Schedule an appointment as soon as possible for a visit on 07/07/2019.   Specialty: Orthopedic Surgery Contact information: [address] [place] [address] [place] [address] [phone]        [employer] Follow up.   Why: Your wheelchair was ordered from this agency.  Call them for details on delivery if you do not hear from them. Contact information: [place] [address] [phone]            Discharge Diagnoses: Principal diagnosis is #1 1. Syncope 2. Orthostatic hypotension 3.   TKA Right knee 4. Hyponatremia  Discharge Condition: Fair  Disposition: Home  Diet recommendation: Regular  Filed Weights   06/22/19 1045  Weight: 95.3 kg    History of present illness:  Andrew Bonilla, 85 y.o. male, has a history of pain and functional disability in the right knee due to arthritis and has failed non-surgical conservative treatments for greater than 12 weeks to includeNSAID's and/or analgesics, corticosteriod injections, viscosupplementation injections, flexibility and strengthening excercises and activity modification.  Onset of symptoms was gradual, starting >10 years ago with gradually worsening course since that time. The patient noted no past surgery on the right knee(s).  Patient currently rates pain in the right knee(s) at 7 out of 10 with activity. Patient has night pain, worsening of pain with activity and weight bearing, pain that interferes with activities of daily living, pain with passive  range of motion, crepitus and joint swelling.  Patient has evidence of periarticular osteophytes and joint space narrowing by imaging studies. There is no active infection.  Hospital Course:  Andrew Bonilla an 85 y.o.malewith history of hypertension, aortic valve replacement, hyperlipidemia, syncope, and osteoarthritis status post right total knee arthroplasty on 06/22/2019, now with recurrent syncope. Patient underwent right total knee replacement on 06/22/2019 without any immediate complications and with minimal EBL. Patient reports that he was working with PT this morning when he began to experience "excruciating" knee pain with the exercises, became diaphoretic, felt as though he was going to pass out, was noted to be hypotensive, and was helped back into his bed where he recovered fairly quickly. There was a second episode this evening when the patient was straining to move his bowels, reports that he had been constipated for several days, and this was followed by diaphoresis, lightheadedness, and the patient was noted to be pale and hypotensive. He was again helped back into his bed, legs were raised, and he recovered quickly.  Patient now reports that he feels "great" and denies any chest pain or palpitations associated with these episodes. Patient underwent a left total knee replacement 1 year ago, and he had multiple syncopal episodes in the postoperative period in that instance as well. He was admitted for syncope 1 year ago, underwent extensive work-up including discussion with neurologist and formal cardiology consultation. Etiology at that time was felt to be orthostasis.  Although the patient stated that he was feeling very well that morning, the patient's orthostatics were markedly positive throughout 06/25/2019 culminating in a syncopal episode later in the day.  Bolus repeated. Cosyntropin stim test negative. Echocardiogram was unremarkable.  The patient has been given 1.5 L  in boluses. He has had no episodes of dizziness or syncope today. He has walked in the halls with PT. He will be discharged to home.  Today's assessment: S: The patient is resting comfortably. No new complaints. O: Vitals:  Vitals:   06/26/19 1409 06/26/19 1437  BP: (!) 172/85 (!) 172/81  Pulse: (!) 118 (!) 105  Resp:    Temp:    SpO2: 100%     For physical exam on the date of discharge please see progress note dated 06/26/2019.  Discharge Instructions  Discharge Instructions    Activity as tolerated - No restrictions   Complete by: As directed    Call MD / Call 911   Complete by: As directed    If you experience chest pain or shortness of breath, CALL 911 and be transported to the hospital emergency room.  If you develope a fever above 101 F, pus (white drainage) or increased drainage or redness at the wound, or calf pain, call your surgeon's office.   Call MD for:   Complete by: As directed    Dizziness, passing out.   Call MD for:  persistant dizziness or light-headedness   Complete by: As directed    Call MD for:  severe uncontrolled pain   Complete by: As directed    Constipation Prevention   Complete by: As directed    Drink plenty of fluids.  Prune juice may be helpful.  You may use a stool softener, such as Colace (over the counter) 100 mg twice a day.  Use MiraLax (over the counter) for constipation as needed.   Diet - low sodium heart healthy   Complete by: As directed    Discharge instructions   Complete by: As directed    Dr. Gaynelle Arabian Total [facility] Specialist [facility] [address] [place], [place] [address] [phone]  TOTAL KNEE REPLACEMENT POSTOPERATIVE DIRECTIONS  Knee Rehabilitation, Guidelines Following Surgery  Results after knee surgery are often greatly improved when you follow the exercise, range of motion and muscle strengthening exercises prescribed by your doctor. Safety measures are also important to protect the knee  from further injury. Any time any of these exercises cause you to have increased pain or swelling in your knee joint, decrease the amount until you are comfortable again and slowly increase them. If you have problems or questions, call your caregiver or physical therapist for advice.   HOME CARE INSTRUCTIONS  Remove items at home which could result in a fall. This includes throw rugs or furniture in walking pathways.  ICE to the affected knee every three hours for 30 minutes at a time and then as needed for pain and swelling.  Continue to use ice on the knee for pain and swelling from surgery. You may notice swelling that will progress down to the foot and ankle.  This is normal after surgery.  Elevate the leg when you are not up walking on it.   Continue to use the breathing machine which will help keep your temperature down.  It is common for your temperature to cycle up and down following surgery, especially at night when you are not up moving around and exerting yourself.  The breathing machine keeps your lungs expanded and your temperature down. Do not place pillow under knee, focus on keeping the knee straight while resting   DIET You may resume your  previous home diet once your are discharged from the hospital.  DRESSING / WOUND CARE / SHOWERING You may shower 3 days after surgery, but keep the wounds dry during showering.  You may use an occlusive plastic wrap (Press'n Seal for example), NO SOAKING/SUBMERGING IN THE BATHTUB.  If the bandage gets wet, change with a clean dry gauze.  If the incision gets wet, pat the wound dry with a clean towel. You may start showering once you are discharged home but do not submerge the incision under water. Just pat the incision dry and apply a dry gauze dressing on daily. Change the surgical dressing daily and reapply a dry dressing each time.  ACTIVITY Walk with your walker as instructed. Use walker as long as suggested by your caregivers. Avoid periods  of inactivity such as sitting longer than an hour when not asleep. This helps prevent blood clots.  You may resume a sexual relationship in one month or when given the OK by your doctor.  You may return to work once you are cleared by your doctor.  Do not drive a car for 6 weeks or until released by you surgeon.  Do not drive while taking narcotics.  WEIGHT BEARING Weight bearing as tolerated with assist device (walker, cane, etc) as directed, use it as long as suggested by your surgeon or therapist, typically at least 4-6 weeks.  POSTOPERATIVE CONSTIPATION PROTOCOL Constipation - defined medically as fewer than three stools per week and severe constipation as less than one stool per week.  One of the most common issues patients have following surgery is constipation.  Even if you have a regular bowel pattern at home, your normal regimen is likely to be disrupted due to multiple reasons following surgery.  Combination of anesthesia, postoperative narcotics, change in appetite and fluid intake all can affect your bowels.  In order to avoid complications following surgery, here are some recommendations in order to help you during your recovery period.  Colace (docusate) - Pick up an over-the-counter form of Colace or another stool softener and take twice a day as long as you are requiring postoperative pain medications.  Take with a full glass of water daily.  If you experience loose stools or diarrhea, hold the colace until you stool forms back up.  If your symptoms do not get better within 1 week or if they get worse, check with your doctor.  Dulcolax (bisacodyl) - Pick up over-the-counter and take as directed by the product packaging as needed to assist with the movement of your bowels.  Take with a full glass of water.  Use this product as needed if not relieved by Colace only.   MiraLax (polyethylene glycol) - Pick up over-the-counter to have on hand.  MiraLax is a solution that will increase  the amount of water in your bowels to assist with bowel movements.  Take as directed and can mix with a glass of water, juice, soda, coffee, or tea.  Take if you go more than two days without a movement. Do not use MiraLax more than once per day. Call your doctor if you are still constipated or irregular after using this medication for 7 days in a row.  If you continue to have problems with postoperative constipation, please contact the office for further assistance and recommendations.  If you experience "the worst abdominal pain ever" or develop nausea or vomiting, please contact the office immediatly for further recommendations for treatment.  ITCHING  If you experience itching  with your medications, try taking only a single pain pill, or even half a pain pill at a time.  You can also use Benadryl over the counter for itching or also to help with sleep.   TED HOSE STOCKINGS Wear the elastic stockings on both legs for three weeks following surgery during the day but you may remove then at night for sleeping.  MEDICATIONS See your medication summary on the "After Visit Summary" that the nursing staff will review with you prior to discharge.  You may have some home medications which will be placed on hold until you complete the course of blood thinner medication.  It is important for you to complete the blood thinner medication as prescribed by your surgeon.  Continue your approved medications as instructed at time of discharge.  PRECAUTIONS If you experience chest pain or shortness of breath - call 911 immediately for transfer to the hospital emergency department.  If you develop a fever greater that 101 F, purulent drainage from wound, increased redness or drainage from wound, foul odor from the wound/dressing, or calf pain - CONTACT YOUR SURGEON.                                                   FOLLOW-UP APPOINTMENTS Make sure you keep all of your appointments after your operation with your  surgeon and caregivers. You should call the office at the above phone number and make an appointment for approximately two weeks after the date of your surgery or on the date instructed by your surgeon outlined in the "After Visit Summary".   RANGE OF MOTION AND STRENGTHENING EXERCISES  Rehabilitation of the knee is important following a knee injury or an operation. After just a few days of immobilization, the muscles of the thigh which control the knee become weakened and shrink (atrophy). Knee exercises are designed to build up the tone and strength of the thigh muscles and to improve knee motion. Often times heat used for twenty to thirty minutes before working out will loosen up your tissues and help with improving the range of motion but do not use heat for the first two weeks following surgery. These exercises can be done on a training (exercise) mat, on the floor, on a table or on a bed. Use what ever works the best and is most comfortable for you Knee exercises include:  Leg Lifts - While your knee is still immobilized in a splint or cast, you can do straight leg raises. Lift the leg to 60 degrees, hold for 3 sec, and slowly lower the leg. Repeat 10-20 times 2-3 times daily. Perform this exercise against resistance later as your knee gets better.  Quad and Hamstring Sets - Tighten up the muscle on the front of the thigh (Quad) and hold for 5-10 sec. Repeat this 10-20 times hourly. Hamstring sets are done by pushing the foot backward against an object and holding for 5-10 sec. Repeat as with quad sets.  Leg Slides: Lying on your back, slowly slide your foot toward your buttocks, bending your knee up off the floor (only go as far as is comfortable). Then slowly slide your foot back down until your leg is flat on the floor again. Angel Wings: Lying on your back spread your legs to the side as far apart as you can without causing discomfort.  A rehabilitation program following serious knee injuries can  speed recovery and prevent re-injury in the future due to weakened muscles. Contact your doctor or a physical therapist for more information on knee rehabilitation.   IF YOU ARE TRANSFERRED TO A SKILLED REHAB FACILITY If the patient is transferred to a skilled rehab facility following release from the hospital, a list of the current medications will be sent to the facility for the patient to continue.  When discharged from the skilled rehab facility, please have the facility set up the patient's [place] prior to being released. Also, the skilled facility will be responsible for providing the patient with their medications at time of release from the facility to include their pain medication, the muscle relaxants, and their blood thinner medication. If the patient is still at the rehab facility at time of the two week follow up appointment, the skilled rehab facility will also need to assist the patient in arranging follow up appointment in our office and any transportation needs.  MAKE SURE YOU:  Understand these instructions.  Get help right away if you are not doing well or get worse.    Pick up stool softner and laxative for home use following surgery while on pain medications. Do not submerge incision under water. Please use good hand washing techniques while changing dressing each day. May shower starting three days after surgery. Please use a clean towel to pat the incision dry following showers. Continue to use ice for pain and swelling after surgery. Do not use any lotions or creams on the incision until instructed by your surgeon.   Discharge instructions   Complete by: As directed    Follow up with orthopedic surgery as directed. Follow up with PCP in 7-10 days.   Increase activity slowly   Complete by: As directed    Increase activity slowly as tolerated   Complete by: As directed      Allergies as of 06/26/2019   No Known Allergies     Medication List      STOP taking these medications   aspirin EC 81 MG tablet   BETA CAROTENE PO   CAL-MAG-ZINC PO   Glucosamine Chondroitin Adv Tabs   multivitamin with minerals Tabs tablet   OMEGA-3 FISH OIL PO   SAW PALMETTO PO   TURMERIC PO   vitamin E 400 UNIT capsule     TAKE these medications   atorvastatin 40 MG tablet Commonly known as: LIPITOR Take 40 mg by mouth daily.   esomeprazole 40 MG capsule Commonly known as: NEXIUM Take 40 mg by mouth daily at 12 noon.   hydrocortisone cream 1 % Apply 1 application topically 2 (two) times daily as needed for itching.   irbesartan 75 MG tablet Commonly known as: AVAPRO TAKE 1 TABLET BY MOUTH EVERY DAY   methocarbamol 500 MG tablet Commonly known as: ROBAXIN Take 1 tablet (500 mg total) by mouth every 6 (six) hours as needed for muscle spasms.   oxyCODONE 5 MG immediate release tablet Commonly known as: Oxy IR/ROXICODONE Take 1-2 tablets (5-10 mg total) by mouth every 6 (six) hours as needed for severe pain.   polyethylene glycol 17 g packet Commonly known as: MIRALAX / GLYCOLAX Take 17 g by mouth 2 (two) times daily.   rivaroxaban 10 MG Tabs tablet Commonly known as: XARELTO Take 1 tablet (10 mg total) by mouth daily with breakfast.   senna-docusate 8.6-50 MG tablet Commonly known as: Senokot-S Take 1 tablet  by mouth at bedtime. What changed:   when to take this  reasons to take this   tadalafil 10 MG tablet Commonly known as: CIALIS Take 10 mg by mouth daily as needed for erectile dysfunction.   traMADol 50 MG tablet Commonly known as: ULTRAM Take 1-2 tablets (50-100 mg total) by mouth every 6 (six) hours as needed for moderate pain.            Durable Medical Equipment  (From admission, onward)         Start     Ordered   06/26/19 1159  For home use only DME standard manual wheelchair with seat cushion  Once    Comments: Patient suffers from s/p right total knee arthroplasty which impairs their  ability to perform daily activities like toileting in the home.  A walker will not resolve issue with performing activities of daily living. A wheelchair will allow patient to safely perform daily activities. Patient can safely propel the wheelchair in the home or has a caregiver who can provide assistance. Length of need 6 months . Accessories: elevating leg rests (ELRs), wheel locks, extensions and anti-tippers.   06/26/19 1200         No Known Allergies  The results of significant diagnostics from this hospitalization (including imaging, microbiology, ancillary and laboratory) are listed below for reference.    Significant Diagnostic Studies: Ct Head Wo Contrast  Result Date: 06/25/2019 CLINICAL DATA:  85 year old male with history of syncope. EXAM: CT HEAD WITHOUT CONTRAST TECHNIQUE: Contiguous axial images were obtained from the base of the skull through the vertex without intravenous contrast. COMPARISON:  06/13/2018. FINDINGS: Brain: Moderate cerebral atrophy with some ex vacuo dilatation of ventricular system. Patchy and confluent areas of decreased attenuation are noted throughout the deep and periventricular white matter of the cerebral hemispheres bilaterally, compatible with chronic microvascular ischemic disease. No evidence of acute infarction, hemorrhage, hydrocephalus, extra-axial collection or mass lesion/mass effect. Vascular: No hyperdense vessel or unexpected calcification. Skull: Normal. Negative for fracture or focal lesion. Sinuses/Orbits: No acute finding. Other: None. IMPRESSION: 1. No acute intracranial abnormalities. 2. Moderate cerebral atrophy with chronic microvascular ischemic changes in the cerebral white matter, as above. Electronically Signed   By: Vinnie Langton M.D.   On: 06/25/2019 19:14    Microbiology: Recent Results (from the past 240 hour(s))  Novel Coronavirus, NAA (Hosp order, Send-out to Ref Lab; TAT 18-24 hrs     Status: None   Collection Time:  06/18/19  3:39 PM   Specimen: Nasopharyngeal Swab; Respiratory  Result Value Ref Range Status   SARS-CoV-2, NAA NOT DETECTED NOT DETECTED Final    Comment: (NOTE) This nucleic acid amplification test was developed and its performance characteristics determined by [employer]. Nucleic acid amplification tests include PCR and TMA. This test has not been FDA cleared or approved. This test has been authorized by FDA under an Emergency Use Authorization (EUA). This test is only authorized for the duration of time the declaration that circumstances exist justifying the authorization of the emergency use of in vitro diagnostic tests for detection of SARS-CoV-2 virus and/or diagnosis of COVID-19 infection under section 564(b)(1) of the Act, 21 U.S.C. GF:7541899) (1), unless the authorization is terminated or revoked sooner. When diagnostic testing is negative, the possibility of a false negative result should be considered in the context of a patient's recent exposures and the presence of clinical signs and symptoms consistent with COVID-19. An individual without symptoms of COVID- 19 and who is  not shedding SARS-CoV-2 vi rus would expect to have a negative (not detected) result in this assay. Performed At: [facility] [address] [place], [place] [id number] Rush Farmer MD [id number]    [place]  Final    Comment: Performed at [facility], [place] [address] [place], [place] [address]     Labs: Basic Metabolic Panel: Recent Labs  Lab 06/23/19 0329 06/24/19 0311 06/25/19 0608 06/25/19 1834 06/26/19 0537  NA 130* 129* 130* 128* 129*  K 4.2 4.2 4.0 3.9 3.6  CL 99 96* 97* 96* 97*  CO2 24 24 24 23 23   GLUCOSE 129* 106* 97 123* 98  BUN 12 15 14 15 13   CREATININE 0.62 0.70 0.68 0.70 0.66  CALCIUM 8.4* 8.7* 8.3* 8.2* 8.1*  MG  --   --   --  1.8  --   PHOS  --   --   --  3.1  --    Liver Function Tests: No results for  input(s): AST, ALT, ALKPHOS, BILITOT, PROT, ALBUMIN in the last 168 hours. No results for input(s): LIPASE, AMYLASE in the last 168 hours. No results for input(s): AMMONIA in the last 168 hours. CBC: Recent Labs  Lab 06/23/19 0329 06/24/19 0311 06/25/19 0608  WBC 11.8* 13.5* 11.8*  HGB 10.5* 10.5* 9.2*  HCT 31.0* 31.1* 26.6*  MCV 91.4 90.7 92.7  PLT 218 232 210   Cardiac Enzymes: No results for input(s): CKTOTAL, CKMB, CKMBINDEX, TROPONINI in the last 168 hours. BNP: BNP (last 3 results) No results for input(s): BNP in the last 8760 hours.  ProBNP (last 3 results) No results for input(s): PROBNP in the last 8760 hours.  CBG: No results for input(s): GLUCAP in the last 168 hours.  Principal Problem:   OA (osteoarthritis) of knee Active Problems:   Essential hypertension   S/P AVR (aortic valve replacement)   Syncope   Hyponatremia   Time coordinating discharge: 38 minutes.  Signed:        Seymour Pavlak, DO [facility]  06/26/2019, 5:30 PM

## 2019-06-26 NOTE — Progress Notes (Signed)
Pt and wife educated at the bedside on d/c instructions, follow ups, and medications with all questions answered at the time by this RN. Pt denies pain and dizziness at this time with no s/s of distress noted.

## 2019-06-26 NOTE — Progress Notes (Signed)
PROGRESS NOTE  Andrew Bonilla [id number] DOB: 04/18/1933 DOA: 06/22/2019 PCP: Wenda Low, MD  Brief History   Andrew Bonilla is an 85 y.o. male with history of hypertension, aortic valve replacement, hyperlipidemia, syncope, and osteoarthritis status post right total knee arthroplasty on 06/22/2019, now with recurrent syncope.  Patient underwent right total knee replacement on 06/22/2019 without any immediate complications and with minimal EBL.  Patient reports that he was working with PT this morning when he began to experience "excruciating" knee pain with the exercises, became diaphoretic, felt as though he was going to pass out, was noted to be hypotensive, and was helped back into his bed where he recovered fairly quickly.  There was a second episode this evening when the patient was straining to move his bowels, reports that he had been constipated for several days, and this was followed by diaphoresis, lightheadedness, and the patient was noted to be pale and hypotensive.  He was again helped back into his bed, legs were raised, and he recovered quickly.  Patient now reports that he feels "great" and denies any chest pain or palpitations associated with these episodes.  Patient underwent a left total knee replacement 1 year ago, and he had multiple syncopal episodes in the postoperative period in that instance as well.  He was admitted for syncope 1 year ago, underwent extensive work-up including discussion with neurologist and formal cardiology consultation.  Etiology at that time was felt to be orthostasis.   Although the patient stated that he was feeling very well that morning, the patient's orthostatics were markedly positive throughout 06/25/2019 culminating in a syncopal episode later in the day. Bolus repeated. Cosyntropin stim test negative. Echocardiogram was unremarkable. Consultants  . Hospitalist medicine  Procedures  . Left TKR  Antibiotics   Anti-infectives (From  admission, onward)   Start     Dose/Rate Route Frequency Ordered Stop   06/22/19 1400  ceFAZolin (ANCEF) IVPB 2g/100 mL premix     2 g 200 mL/hr over 30 Minutes Intravenous Every 6 hours 06/22/19 1041 06/22/19 2023   06/22/19 0630  ceFAZolin (ANCEF) IVPB 2g/100 mL premix     2 g 200 mL/hr over 30 Minutes Intravenous On call to O.R. 06/22/19 0617 06/22/19 0834     Interval History/Subjective  The patient is resting comfortably. No new complaints. He feels that his syncopal episodes are related to physical therapy requiring him to attempt to climb stairs.  Objective   Vitals:  Vitals:   06/26/19 1409 06/26/19 1437  BP: (!) 172/85 (!) 172/81  Pulse: (!) 118 (!) 105  Resp:    Temp:    SpO2: 100%    Exam:  Constitutional:  . The patient is awake, alert, and oriented x 3. No acute distress. Respiratory:  . No increased work of breathing. . No wheezes, rales, or rhonchi . No tactile fremitus Cardiovascular:  . Regular rate and rhythm . No murmurs, ectopy, or gallups. . No lateral PMI. No thrills. Abdomen:  . Abdomen is soft, non-tender, non-distended . No hernias, masses, or organomegaly . Normoactive bowel sounds.  Musculoskeletal:  . No cyanosis, clubbing, or edema Skin:  . No rashes, lesions, ulcers . palpation of skin: no induration or nodules Neurologic:  . CN 2-12 intact . Sensation all 4 extremities intact Psychiatric:  . Mental status o Mood, affect appropriate o Orientation to person, place, time  . judgment and insight appear intact  I have personally reviewed the following:   Today's Data  .  Vitals, BMP, CBC  Scheduled Meds: . atorvastatin  40 mg Oral Daily  . docusate sodium  100 mg Oral BID  . irbesartan  75 mg Oral Daily  . pantoprazole  80 mg Oral Q1200  . rivaroxaban  10 mg Oral Q breakfast   Continuous Infusions: . methocarbamol (ROBAXIN) IV      Principal Problem:   OA (osteoarthritis) of knee Active Problems:   Essential  hypertension   S/P AVR (aortic valve replacement)   Syncope   Hyponatremia   LOS: 0 days   A & P  Recurrent syncope:  Patient had 2 syncopal episodes 10/21, first preceded by severe knee pain while working with PT and involved diaphoresis and hypotension, and the second occurred while straining to move bowels and was again associated with diaphoresis and hypotension. Orthostatic vitals with strongly positive again this morning with symptoms. 1L Bolus of NS given. Will recheck orthostatics in the am as well as check a cosyntropin stim test. The patient has a history of bioprosthetic AVR in 2013, functioning well with minimal gradient and no leak on echo one year ago. EKG demonstrates only sinus arrhythmia. Overall, history most consistent with neurally-mediated syncope triggered by pain and straining to move bowels in the setting of significant orthostatic hypotension, though hypovolemia & orthostasis may be contributing. The patient has received 1.5 liters in boluses over the past 24 hours. Will follow orthostatic throughout the day. The patient may discharge home if no further syncopal episodes and orthostatics are not positive.  Hyponatremia: Improved. 129 this morning. Likely due to pain in the setting of hypovolemia. HCTZ has been held, and the patient has received 1.5 L in boluses. Cosyntropin stim test is within normal limits.  OA s/p right total knee replacement 10/19: Pain is well-controlled currently, although the patient complains of severe pain with attempting stairs.His work with PT has been complicated by pain and syncope.  I have seen and examined this patient myself. I have spent 45 minutes in his evaluation and care. I have discussed the patient with his daughter, Santiago Glad at length. All questions answered to the best of my abilities.  DVT Prophylaxis: Xarelto CODE STATUS: Full Code Family Communication: None at bedside Disposition: Plans to d/c to home   Celisa Schoenberg, DO [facility] Direct contact: see [email or web address]  7PM-7AM contact night coverage as above 06/26/2019, 4:12 PM  LOS: 0 days

## 2019-06-26 NOTE — Progress Notes (Signed)
  Echocardiogram 2D Echocardiogram has been performed.  Andrew Bonilla 06/26/2019, 11:48 AM

## 2019-06-26 NOTE — Clinical Social Work Note (Signed)
Alerted by nursing that patient needs a wheelchair.  Asked for, and promised orders by Griffith Citron, PA.  Called [employer] and asked for wheelchair delivery to patient home.

## 2019-06-26 NOTE — Progress Notes (Signed)
   Subjective: 4 Days Post-Op Procedure(s) (LRB): TOTAL KNEE ARTHROPLASTY (Right) Patient reports pain as mild.   Patient seen in rounds by Dr. Wynelle Link. Patient is well, but has continued to have episodes of pre-syncope. Rapid response was called last night due to possible vagal response while ambulating to bedside commode. He had positive orthostatic vital signs yesterday as well. Patient reports he is feeling well this morning. He denies CP, SHOB. Voiding without difficulty.  Plan is to go Home after hospital stay.  Objective: Vital signs in last 24 hours: Temp:  [97.7 F (36.5 C)-98.1 F (36.7 C)] 97.7 F (36.5 C) (10/23 0435) Pulse Rate:  [83-120] 83 (10/23 0435) Resp:  [16-19] 16 (10/23 0435) BP: (135-156)/(59-81) 153/81 (10/23 0435) SpO2:  [95 %-98 %] 95 % (10/23 0435)  Intake/Output from previous day:  Intake/Output Summary (Last 24 hours) at 06/26/2019 0739 Last data filed at 06/26/2019 0430 Gross per 24 hour  Intake 720 ml  Output 1425 ml  Net -705 ml    Intake/Output this shift: No intake/output data recorded.  Labs: Recent Labs    06/24/19 0311 06/25/19 0608  HGB 10.5* 9.2*   Recent Labs    06/24/19 0311 06/25/19 0608  WBC 13.5* 11.8*  RBC 3.43* 2.87*  HCT 31.1* 26.6*  PLT 232 210   Recent Labs    06/25/19 1834 06/26/19 0537  NA 128* 129*  K 3.9 3.6  CL 96* 97*  CO2 23 23  BUN 15 13  CREATININE 0.70 0.66  GLUCOSE 123* 98  CALCIUM 8.2* 8.1*   No results for input(s): LABPT, INR in the last 72 hours.  Exam: General - Patient is Alert and Oriented Extremity - Neurologically intact Sensation intact distally Intact pulses distally Dorsiflexion/Plantar flexion intact Dressing/Incision - clean, dry, no drainage Motor Function - intact, moving foot and toes well on exam.   Past Medical History:  Diagnosis Date  . Arthritis   . Constipation   . Deaf, left    WEARS HEARING AID RIGHT EAR   . GERD (gastroesophageal reflux disease)   . Hard  of hearing   . Heart murmur    mod-severe aortic stenosis   . Hyperlipemia   . Hypertension   . Pneumonia    in college  . Prostate enlargement   . Recurrent upper respiratory infection (URI)    12/2011  . S/P AVR (aortic valve replacement) 01/16/2012   [facility] Ease 21mm pericardial tissue valve    Assessment/Plan: 4 Days Post-Op Procedure(s) (LRB): TOTAL KNEE ARTHROPLASTY (Right) Principal Problem:   OA (osteoarthritis) of knee Active Problems:   Essential hypertension   S/P AVR (aortic valve replacement)   Syncope   Hyponatremia  Estimated body mass index is 32.89 kg/m as calculated from the following:   Height as of this encounter: 5\' 7"  (1.702 m).   Weight as of this encounter: 95.3 kg. Advance diet Up with therapy  DVT Prophylaxis - Xarelto Weight-bearing as tolerated  Plan for discharge home with HHPT when medically stable as determined by the medicine team. He had very similar issues with his last total knee, and had a full medical work up at that time. He is ready for discharge from an orthopedic stand point. HHPT arranged, with transition to OPPT after 1-2 weeks. He will follow up in the office in 2 weeks.   Griffith Citron, PA-C Orthopedic Surgery [phone] 06/26/2019, 7:39 AM

## 2019-11-04 ENCOUNTER — Encounter: Payer: Self-pay | Admitting: Gastroenterology

## 2019-11-13 ENCOUNTER — Other Ambulatory Visit: Payer: Self-pay | Admitting: Internal Medicine

## 2019-11-25 ENCOUNTER — Other Ambulatory Visit (INDEPENDENT_AMBULATORY_CARE_PROVIDER_SITE_OTHER): Payer: Medicare Other

## 2019-11-25 ENCOUNTER — Ambulatory Visit: Payer: Medicare Other | Admitting: Gastroenterology

## 2019-11-25 ENCOUNTER — Encounter: Payer: Self-pay | Admitting: Gastroenterology

## 2019-11-25 VITALS — BP 108/56 | HR 76 | Temp 97.4°F | Ht 67.0 in | Wt 212.4 lb

## 2019-11-25 DIAGNOSIS — D649 Anemia, unspecified: Secondary | ICD-10-CM

## 2019-11-25 DIAGNOSIS — K5900 Constipation, unspecified: Secondary | ICD-10-CM | POA: Diagnosis not present

## 2019-11-25 LAB — CBC WITH DIFFERENTIAL/PLATELET
Basophils Absolute: 0.1 10*3/uL (ref 0.0–0.1)
Basophils Relative: 0.9 % (ref 0.0–3.0)
Eosinophils Absolute: 0.1 10*3/uL (ref 0.0–0.7)
Eosinophils Relative: 1.9 % (ref 0.0–5.0)
HCT: 38.3 % — ABNORMAL LOW (ref 39.0–52.0)
Hemoglobin: 12.9 g/dL — ABNORMAL LOW (ref 13.0–17.0)
Lymphocytes Relative: 40.4 % (ref 12.0–46.0)
Lymphs Abs: 3 10*3/uL (ref 0.7–4.0)
MCHC: 33.7 g/dL (ref 30.0–36.0)
MCV: 88.5 fl (ref 78.0–100.0)
Monocytes Absolute: 1.1 10*3/uL — ABNORMAL HIGH (ref 0.1–1.0)
Monocytes Relative: 15.1 % — ABNORMAL HIGH (ref 3.0–12.0)
Neutro Abs: 3.2 10*3/uL (ref 1.4–7.7)
Neutrophils Relative %: 41.7 % — ABNORMAL LOW (ref 43.0–77.0)
Platelets: 261 10*3/uL (ref 150.0–400.0)
RBC: 4.32 Mil/uL (ref 4.22–5.81)
RDW: 15.1 % (ref 11.5–15.5)
WBC: 7.5 10*3/uL (ref 4.0–10.5)

## 2019-11-25 LAB — TSH: TSH: 1.22 u[IU]/mL (ref 0.35–4.50)

## 2019-11-25 NOTE — Progress Notes (Signed)
HPI :  86 y/o male with a history of aortic valve replacement, carotid artery stenosis, on on Xarelto, referred by Benita Stabile MD for constipation.  The patient is accompanied by his wife today.  He states he historically has had very regular bowel movements until about 5 years ago he developed constipation.  For the last few years he has been using smooth move/senna which initially worked well for him but then stopped working and he developed worsening constipation in the past few months.  He states if he does not take anything for his bowels he will essentially not have a bowel movement and has required laxatives over the past few years.  Most recently he has been on MiraLAX twice daily and is quite happy with the regimen.  He is having 1-2 bowel movements per day, feels that he is evacuating okay.  He denies any blood in the stools.  He denies any abdominal pains but can have some bloating when he feels that he is constipated.   He and his wife inquire whether or not some of the supplements he is using could be contributing to this.  He drinks herbal teas routinely and takes a variety of supplements however while he can recall some of them to me today, he does not have his list of supplements available to review.  He previously was on some low-dose narcotic when he had his knee replaced back in October but has not taking any pain medications at this time.  He states he had been gaining some weight but lost some weight with his knee replacements and he has been trying to do so.  He is eating well and denies any other digestive complaints.  He thinks he had his last colonoscopy in 2014 done at [facility].  He does not think there is any significant abnormalities noted but we will request those reports.  Otherwise he is feeling well without complaints today.  He had a CT scan in 2019 which did not show any concerning changes in his colon other than constipation.  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  Of note during his knee replacement in October he did become anemic with a hemoglobin of nines.  He does have some fatigue at times.  He has not had follow-up blood tests since that time.   Echo 06/26/19 - EF 60-65%   CT scan 06/12/2018 - IMPRESSION: 1. Stable to slightly smaller lipoma of the pancreatic head now measuring 1.6 cm versus 2 cm previously. 2. Simple cyst arising off the upper pole the left kidney measuring approximately 4.9 cm in diameter. No obstructive uropathy. 3. Moderate stool retention within the right colon. No bowel obstruction or inflammation. Normal appendix. 4. Prostatomegaly with the prostate measuring 6 x 4.9 x 5.2 cm. 5. Levoconvex curvature of the lumbar spine with lower lumbar degenerative disc disease most pronounced at L4-5 with vacuum disc phenomena. 6. Cardiomegaly with aortic valvular replacement and coronary Arteriosclerosis.  CT scan 09/26/2004: IMPRESSION:  Since previous [facility] chest CT 08/22/04 no interval change:  1. 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   2. Probable tiny dome of liver cyst with upper pole 3.5 cm left renal cyst.   3. Moderate atheromatous vascular calcification with slight lumbar rotary levoscoliosis with slight diffuse fatty infiltration of the liver.   4. Otherwise negative.    Past Medical History:  Diagnosis Date  .  Arthritis   . Atherosclerosis of abdominal aorta ([place]) 2015  . Carotid artery disease ([place]) 2015  . Constipation   . Deaf, left    WEARS HEARING AID RIGHT EAR   . GERD (gastroesophageal reflux disease)   . Hard of hearing    hearing aid in R ear  . Heart murmur    mod-severe aortic stenosis   . Hyperlipemia   . Hypertension   . Osteoarthritis of knee 2019   Left knee replacement  . Pneumonia    in college  . Prostate enlargement    BPH  . Recurrent upper respiratory infection  (URI)    12/2011  . S/P AVR (aortic valve replacement) 01/16/2012   [facility] Ease 60mm pericardial tissue valve     Past Surgical History:  Procedure Laterality Date  . AORTIC VALVE REPLACEMENT  01/17/2012   Procedure: AORTIC VALVE REPLACEMENT (AVR);  Surgeon: Ivin Poot, MD;  Location: [place];  Service: Open Heart Surgery;  Laterality: N/A;  . CARDIAC CATHETERIZATION  12/17/2011   mild nonobstructive CAD - 20% LAD stenosis, 30% OM stenosis, 20% Cfx, 20% RCA lesion  . JOINT REPLACEMENT     Left total knee Dr. Wynelle Link 06-09-18  . LEFT HEART CATHETERIZATION WITH CORONARY ANGIOGRAM N/A 12/17/2011   Procedure: LEFT HEART CATHETERIZATION WITH CORONARY ANGIOGRAM;  Surgeon: Pixie Casino, MD;  Location: [facility] CATH LAB;  Service: Cardiovascular;  Laterality: N/A;  . TEE WITHOUT CARDIOVERSION  01/2012   EF 50-55%; calcified AV annulus, severely calcified AV leaflets, mild regurg (AVR)  . TOTAL KNEE ARTHROPLASTY Left 06/09/2018   Procedure: LEFT TOTAL KNEE ARTHROPLASTY;  Surgeon: Gaynelle Arabian, MD;  Location: [facility];  Service: Orthopedics;  Laterality: Left;  . TOTAL KNEE ARTHROPLASTY Right 06/22/2019   Procedure: TOTAL KNEE ARTHROPLASTY;  Surgeon: Gaynelle Arabian, MD;  Location: [facility];  Service: Orthopedics;  Laterality: Right;  16min   Family History  Problem Relation Age of Onset  . Coronary artery disease Father 57       MI/smoker  . Colon cancer Brother   . Ovarian cancer Sister   . COPD Sister    Social History   Tobacco Use  . Smoking status: Never Smoker  . Smokeless tobacco: Never Used  Substance Use Topics  . Alcohol use: Not Currently    Alcohol/week: 1.0 - 2.0 standard drinks    Types: 1 - 2 Standard drinks or equivalent per week    Comment: SOCIAL   . Drug use: Never   Current Outpatient Medications  Medication Sig Dispense Refill  . atorvastatin (LIPITOR) 40 MG tablet Take 40 mg by mouth daily.    Marland Kitchen esomeprazole (NEXIUM) 40 MG capsule Take 40 mg by mouth daily at  12 noon.     . hydrocortisone cream 1 % Apply 1 application topically 2 (two) times daily as needed for itching.     . irbesartan (AVAPRO) 75 MG tablet TAKE 1 TABLET BY MOUTH EVERY DAY 30 tablet 1  . methocarbamol (ROBAXIN) 500 MG tablet Take 1 tablet (500 mg total) by mouth every 6 (six) hours as needed for muscle spasms. 40 tablet 0  . oxyCODONE (OXY IR/ROXICODONE) 5 MG immediate release tablet Take 1-2 tablets (5-10 mg total) by mouth every 6 (six) hours as needed for severe pain. 42 tablet 0  . polyethylene glycol (MIRALAX / GLYCOLAX) packet Take 17 g by mouth 2 (two) times daily. 14 each 0  . rivaroxaban (XARELTO) 10 MG TABS tablet Take 1 tablet (  10 mg total) by mouth daily with breakfast. 20 tablet 0  . senna-docusate (SENOKOT-S) 8.6-50 MG tablet Take 1 tablet by mouth at bedtime. (Patient taking differently: Take 1 tablet by mouth daily as needed for moderate constipation. ) 30 tablet 0  . tadalafil (CIALIS) 10 MG tablet Take 10 mg by mouth daily as needed for erectile dysfunction.     . traMADol (ULTRAM) 50 MG tablet Take 1-2 tablets (50-100 mg total) by mouth every 6 (six) hours as needed for moderate pain. 40 tablet 0   No current facility-administered medications for this visit.   No Known Allergies   Review of Systems: All systems reviewed and negative except where noted in HPI.     CBC Latest Ref Rng & Units 06/25/2019 06/24/2019 06/23/2019  WBC 4.0 - 10.5 K/uL 11.8(H) 13.5(H) 11.8(H)  Hemoglobin 13.0 - 17.0 g/dL 9.2(L) 10.5(L) 10.5(L)  Hematocrit 39.0 - 52.0 % 26.6(L) 31.1(L) 31.0(L)  Platelets 150 - 400 K/uL 210 232 218    Lab Results  Component Value Date   CREATININE 0.66 06/26/2019   BUN 13 06/26/2019   NA 129 (L) 06/26/2019   K 3.6 06/26/2019   CL 97 (L) 06/26/2019   CO2 23 06/26/2019    Lab Results  Component Value Date   ALT 22 06/15/2019   AST 26 06/15/2019   ALKPHOS 53 06/15/2019   BILITOT 1.2 06/15/2019    Physical Exam: Temp (!) 97.4 F (36.3  C)   Ht 5\' 7"  (1.702 m)   Wt 212 lb 6.4 oz (96.3 kg)   BMI 33.27 kg/m  Constitutional: Pleasant,well-developed, male in no acute distress. HEENT: Normocephalic and atraumatic. Conjunctivae are normal. No scleral icterus. Neck supple.  Cardiovascular: Normal rate, regular rhythm.  Pulmonary/chest: Effort normal and breath sounds normal.  Abdominal: Soft, nondistended, nontender. . There are no masses palpable.  DRE - no mass lesions appreciated, normal - CMA Tia Alert standby Extremities: no edema Lymphadenopathy: No cervical adenopathy noted. Neurological: Alert and oriented to person place and time. Skin: Skin is warm and dry. No rashes noted. Psychiatric: Normal mood and affect. Behavior is normal.   ASSESSMENT AND PLAN: 86 year old male here for new patient assessment of the following:  Constipation - history as above, over the past few years this appears to have gotten slightly worse.  I do not see any clear medications he is taking that could be causing this however I did ask his wife to provide me a list of his supplements so I can review those and make sure nothing on that list is contributing.  Otherwise he had a CT scan in 2019 which looked okay and a prior colonoscopy in 2014 which reportedly did not show any significant abnormalities but I will reach out to [facility] to get that report to ensure that. This is reassuring. As below he does have some anemia in recent months however that is very likely just postsurgical related to knee replacement, but will check it again to ensure this is resolved over time.  We will also check TSH to make sure normal.  We otherwise discussed constipation and bowel transit in general.  His DRE is normal without mass lesions.  I think it unlikely he has anything concerning causing this, he probably slow transit in the setting of advancing age.  He is doing quite well on MiraLAX and this works reliably well for him, recommend he continue this regimen for  now.  If he has any alarm symptoms that we reviewed  he can contact me for reassessment but I do not feel that we need to pursue colonoscopy right now since he is otherwise doing well. He can follow up as needed.  Anemia - as above likely postsurgical from knee replacement but will make sure this has normalized since that time given some of his symptoms.  Kathryn Cellar, MD [facility]  CC: Wenda Low, MD

## 2019-11-25 NOTE — Patient Instructions (Addendum)
If you are age 84 or older, your body mass index should be between 23-30. Your Body mass index is 33.27 kg/m. If this is out of the aforementioned range listed, please consider follow up with your Primary Care Provider.  If you are age 25 or younger, your body mass index should be between 19-25. Your Body mass index is 33.27 kg/m. If this is out of the aformentioned range listed, please consider follow up with your Primary Care Provider.    We will request for colonoscopy records from [facility].  Continue Miralax.   Please go to the lab in the basement of our building to have lab work done as you leave today. Hit "B" for basement when you get on the elevator.  When the doors open the lab is on your left.  We will call you with the results. Thank you.  Due to recent changes in healthcare laws, you may see the results of your imaging and laboratory studies on MyChart before your provider has had a chance to review them.  We understand that in some cases there may be results that are confusing or concerning to you. Not all laboratory results come back in the same time frame and the provider may be waiting for multiple results in order to interpret others.  Please give [place] 48 hours in order for your provider to thoroughly review all the results before contacting the office for clarification of your results.   Thank you for entrusting me with your care and for choosing [facility], Dr. [place] Cellar

## 2019-12-09 IMAGING — CT CT ABD-PELV W/ CM
2 of 5 series · 15 of 46 positions shown, 17 images · IV contrast (APPLIED)
Comparison: 12/15/2011 radiographs, 09/26/2004 CT

CLINICAL DATA: Acute generalized abdominal pain and constipation.
Recent left knee arthroplasty 06/09/2018

EXAM:
CT ABDOMEN AND PELVIS WITH CONTRAST
TECHNIQUE: Multidetector CT imaging of the abdomen and pelvis was performed
using the standard protocol following bolus administration of
intravenous contrast.
CONTRAST:  100mL F6J0D0-R1T IOPAMIDOL (F6J0D0-R1T) INJECTION 76% as
part of the chest, abdomen and pelvic CT studies.

[Series 5: abd/ pelvis 5.0 i30f 1 · axial · 0.98mm/px · z∈[-619,-139]mm · 12 of 108 slices shown, 14 images]
[im 6/108  soft-tissue]
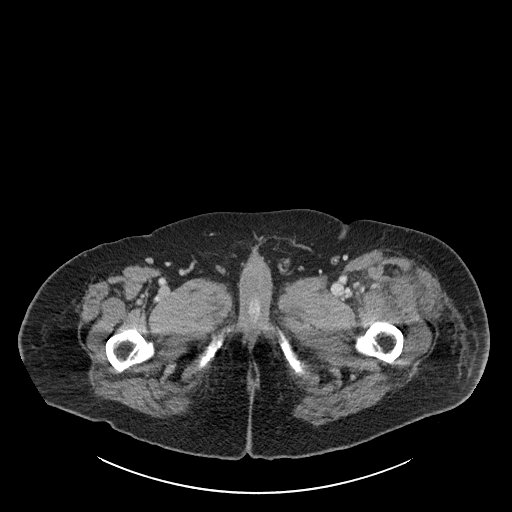
[im 6/108  bone]
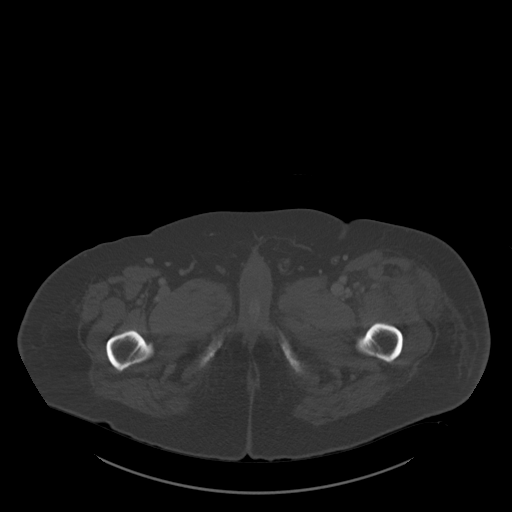
[im 17/108  soft-tissue]
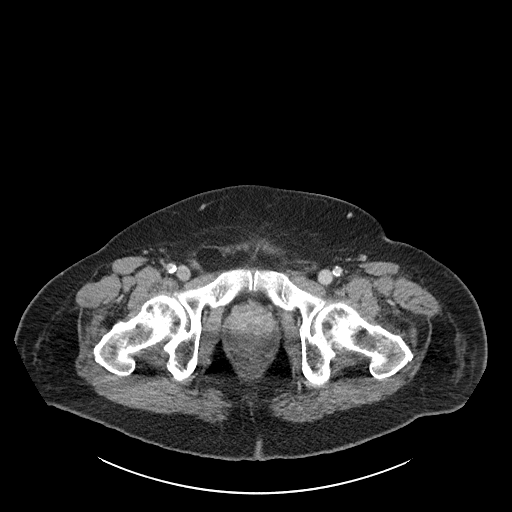
[im 23/108  soft-tissue]
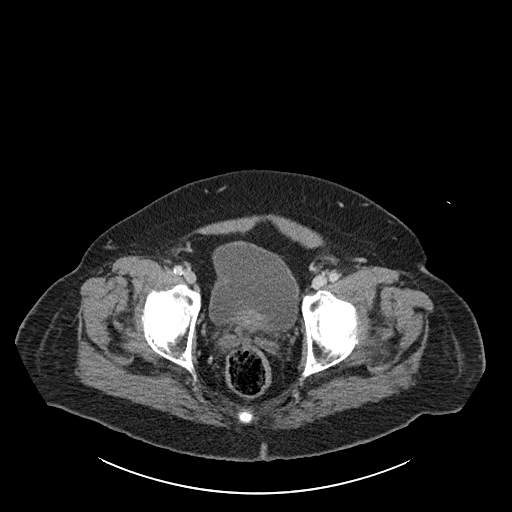
[im 34/108  soft-tissue]
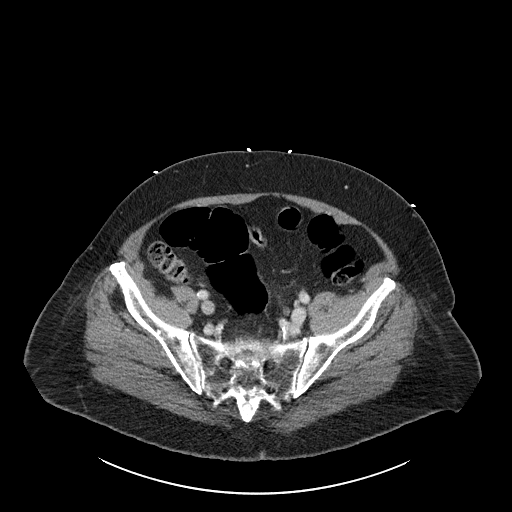
[im 40/108  soft-tissue]
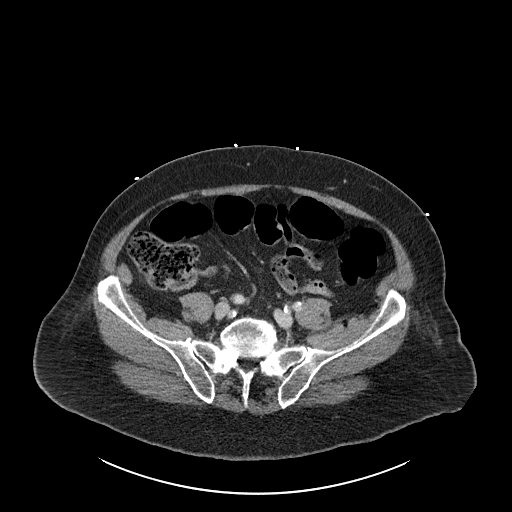
[im 51/108  soft-tissue]
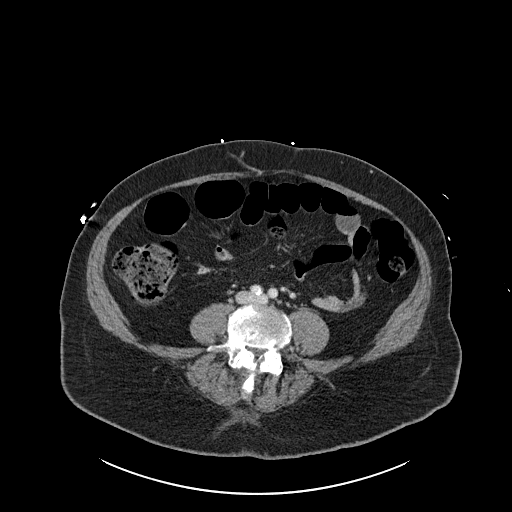
[im 57/108  soft-tissue]
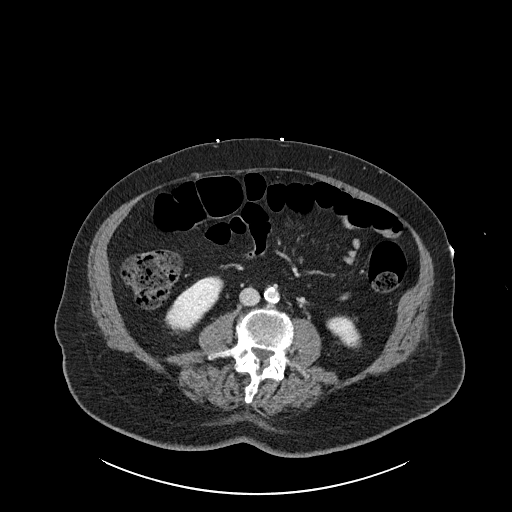
[im 68/108  soft-tissue]
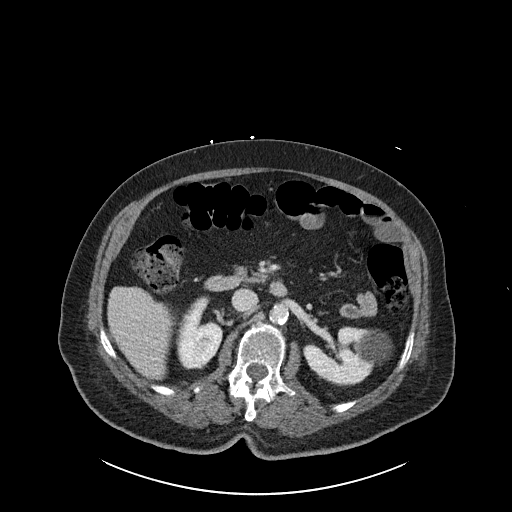
[im 74/108  soft-tissue]
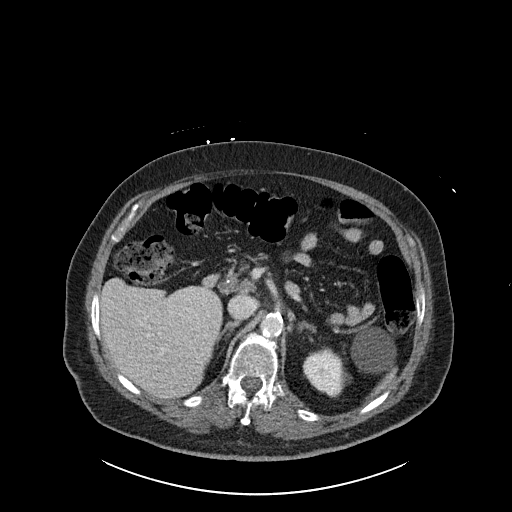
[im 74/108  bone]
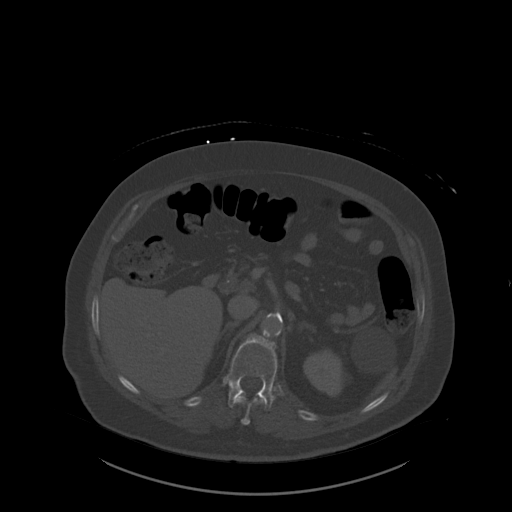
[im 85/108  soft-tissue]
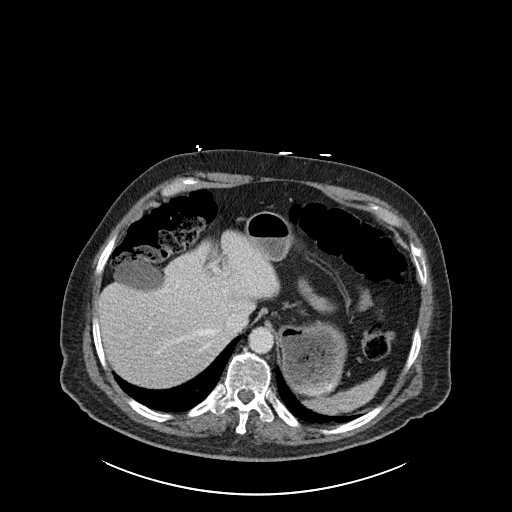
[im 91/108  soft-tissue]
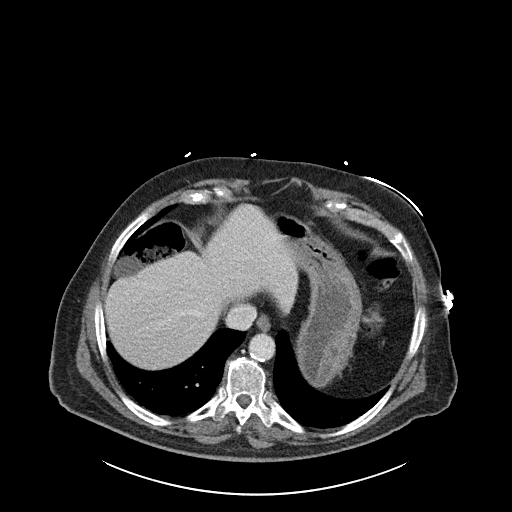
[im 102/108  soft-tissue]
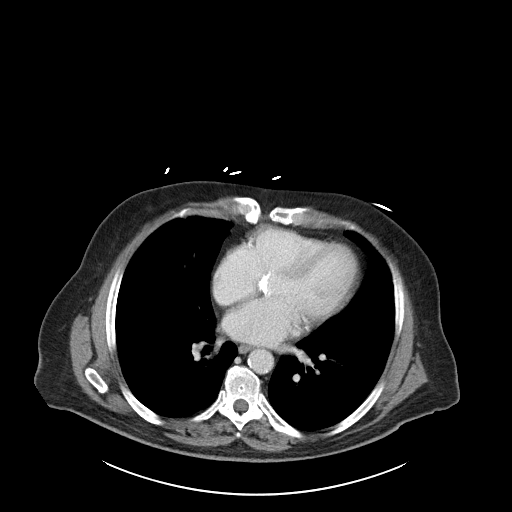

[Series 8: coronals · coronal · 0.90mm/px · 3 of 118 slices shown]
[im 40/118  soft-tissue]
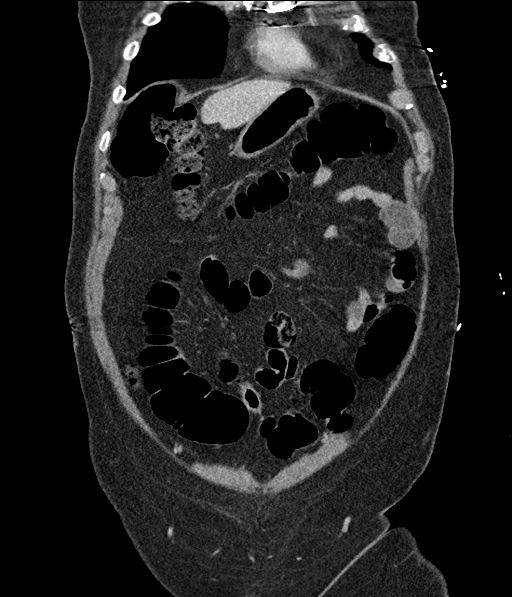
[im 53/118  soft-tissue]
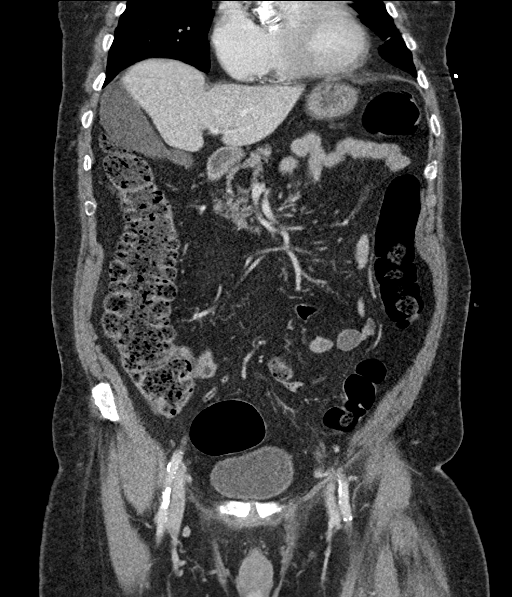
[im 66/118  soft-tissue]
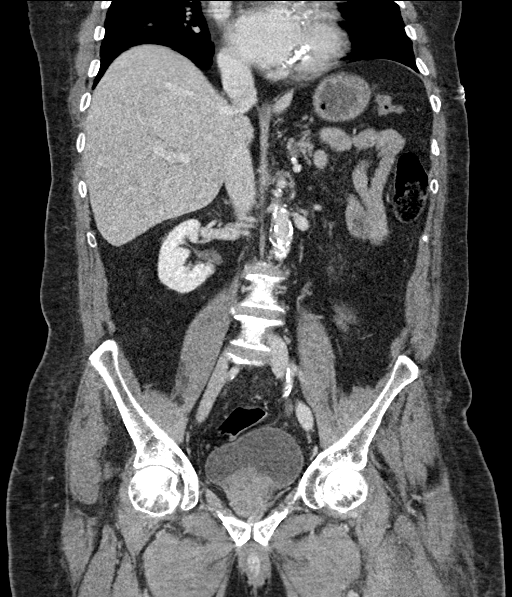

[15 of 46 positions shown; findings below may reference images not displayed]

FINDINGS: Lower chest: The included heart size is enlarged. No pericardial
effusion. Status post aortic valve replacement. Coronary
arteriosclerosis. Atelectasis noted in the right lower lobe and
lingula.

Hepatobiliary: Colonic interposition over the liver shadow.
Homogeneous attenuation of the liver without mass. No biliary
dilatation with the gallbladder is normal.

Pancreas: 16 mm fatty lesion in the pancreatic head compatible with
a small lipoma of the pancreas, unchanged in appearance to slightly
smaller. No ductal dilatation or inflammation.

Spleen: Normal size spleen without mass

Adrenals/Urinary Tract: Normal bilateral adrenal glands. Simple cyst
arising off the upper pole the left kidney measuring approximately
4.9 cm in diameter. No obstructive uropathy. The urinary bladder is
unremarkable for the degree of distention.

Stomach/Bowel: Moderate stool retention within the right colon.
Gaseous distention of large bowel is otherwise noted without
mechanical bowel obstruction. The stomach and small intestine are
nonacute. Normal-appearing appendix. The distal and terminal ileum
are within normal limits.

Vascular/Lymphatic: Moderate marked aortoiliac atherosclerosis
without aneurysm. Patent branch vessels off the aorta. No
lymphadenopathy.

Reproductive: Prostatomegaly with the prostate measuring
approximately 6 x 4.9 cm x 5.2 cm.

Other: No free air nor free fluid.

Musculoskeletal: Levoconvex curvature of the lumbar spine with lower
lumbar degenerative disc disease most pronounced at L4-5.
Osteoarthritic joint space narrowing of both hips. No acute nor
suspicious osseous lesions.
IMPRESSION: 1. Stable to slightly smaller lipoma of the pancreatic head now
measuring 1.6 cm versus 2 cm previously.
2. Simple cyst arising off the upper pole the left kidney measuring
approximately 4.9 cm in diameter. No obstructive uropathy.
3. Moderate stool retention within the right colon. No bowel
obstruction or inflammation. Normal appendix.
4. Prostatomegaly with the prostate measuring 6 x 4.9 x 5.2 cm.
5. Levoconvex curvature of the lumbar spine with lower lumbar
degenerative disc disease most pronounced at L4-5 with vacuum disc
phenomena.
6. Cardiomegaly with aortic valvular replacement and coronary
arteriosclerosis.

## 2019-12-09 IMAGING — CT CT ANGIO CHEST
2 of 6 series · 18 of 46 positions shown · IV contrast (APPLIED)
Comparison: Same day CXR and chest radiographs dating back through
01/14/2012.

CLINICAL DATA: Recent left total knee arthroplasty on 06/09/2018
presents with dyspnea.

EXAM:
CT ANGIOGRAPHY CHEST WITH CONTRAST
TECHNIQUE: Multidetector CT imaging of the chest was performed using the
standard protocol during bolus administration of intravenous
contrast. Multiplanar CT image reconstructions and MIPs were
obtained to evaluate the vascular anatomy.
CONTRAST:  100mL SBJVDN-TC2 IOPAMIDOL (SBJVDN-TC2) INJECTION 76%

[Series 4: thins · axial · 0.98mm/px · z∈[-219,+4]mm · 16 of 245 slices shown]
[im 11/245  lung]
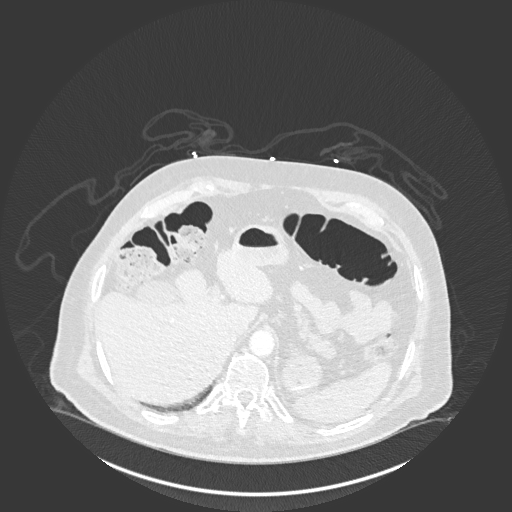
[im 32/245  soft-tissue]
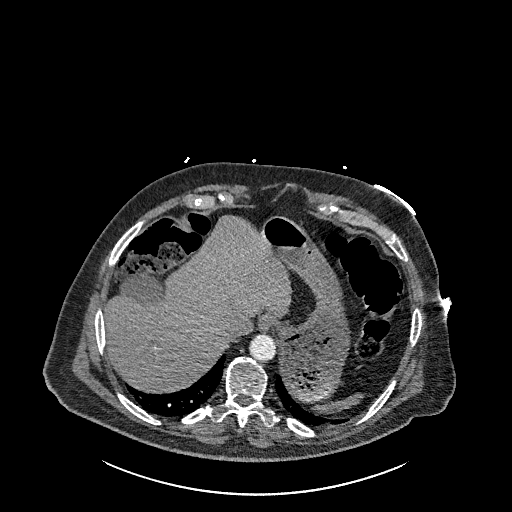
[im 43/245  lung]
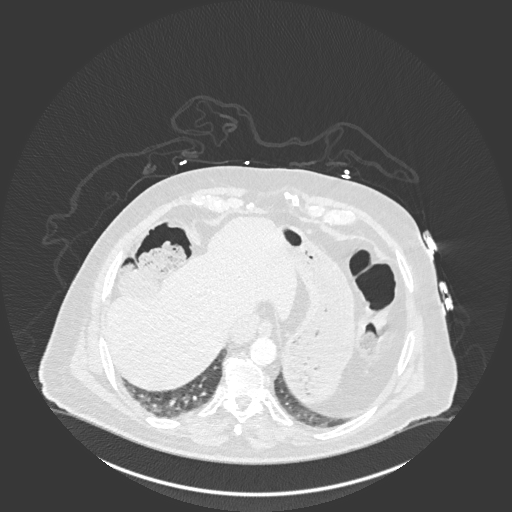
[im 54/245  soft-tissue]
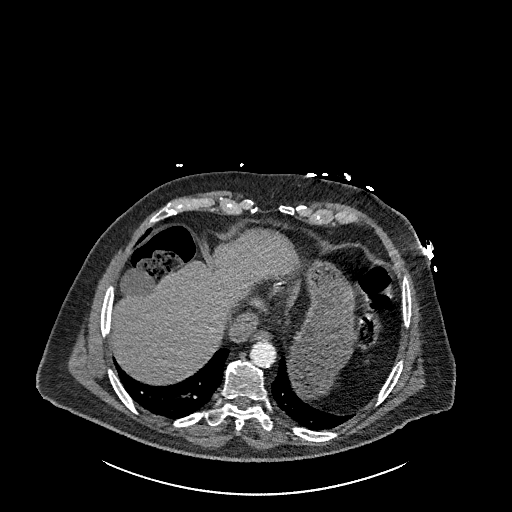
[im 75/245  lung]
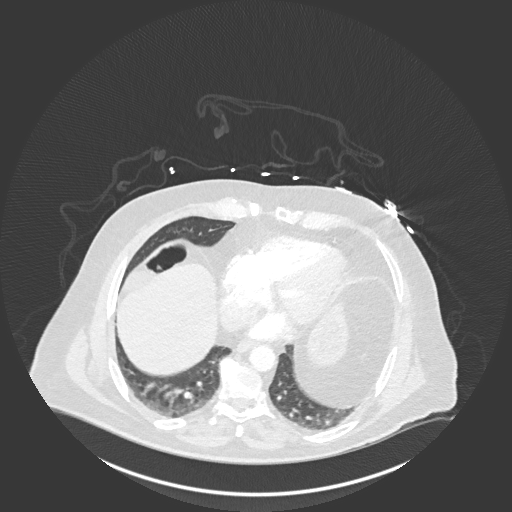
[im 85/245  soft-tissue]
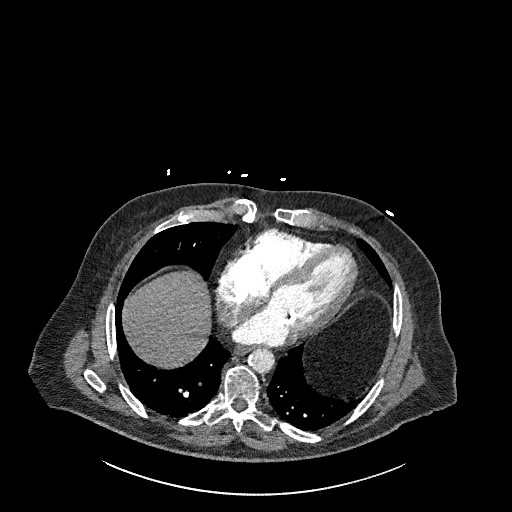
[im 96/245  lung]
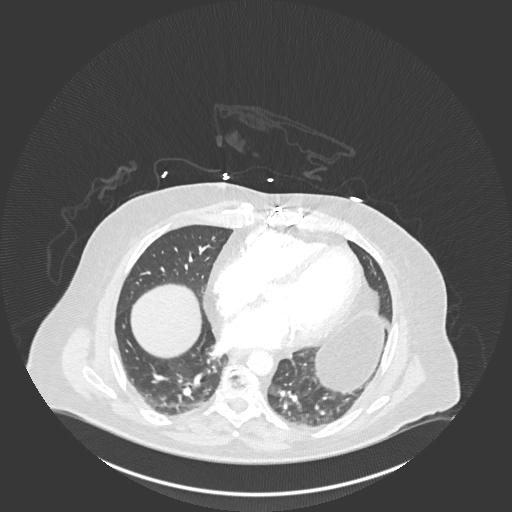
[im 117/245  soft-tissue]
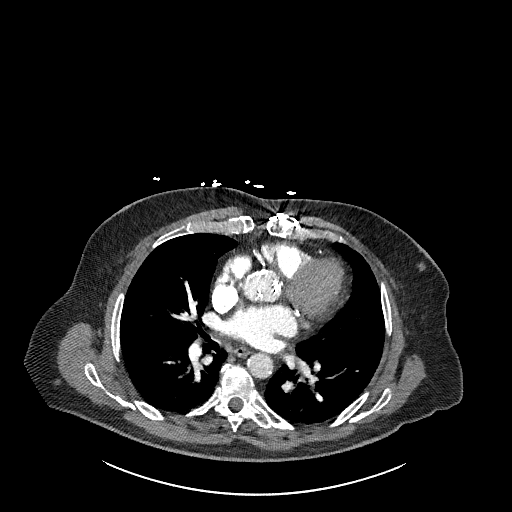
[im 128/245  lung]
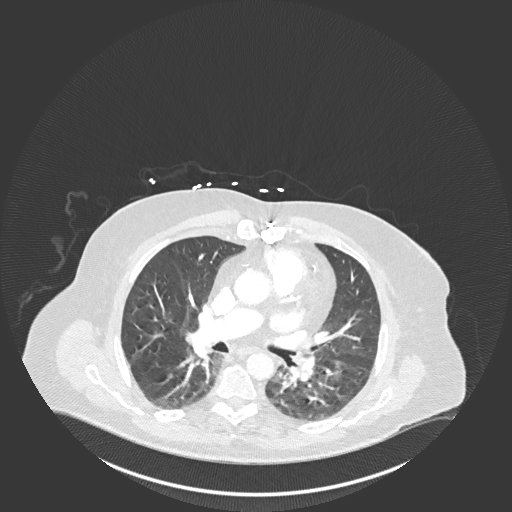
[im 149/245  soft-tissue]
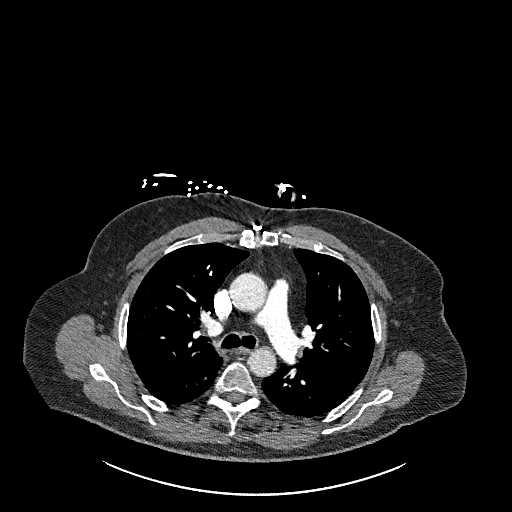
[im 160/245  lung]
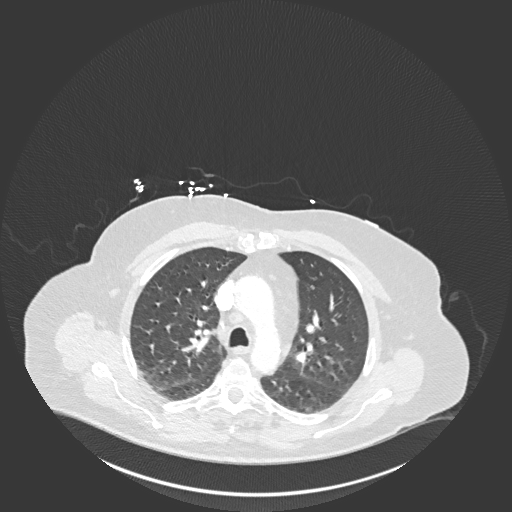
[im 170/245  soft-tissue]
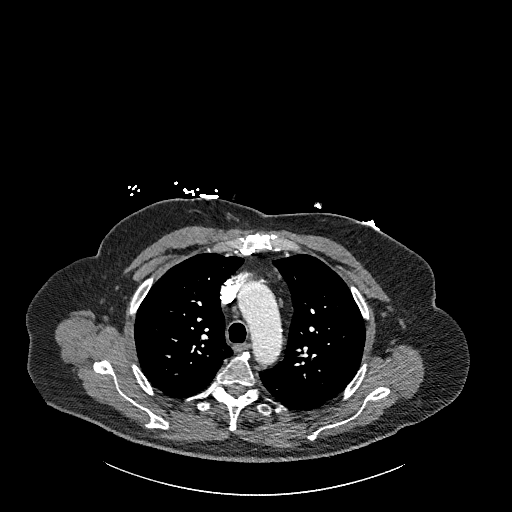
[im 191/245  lung]
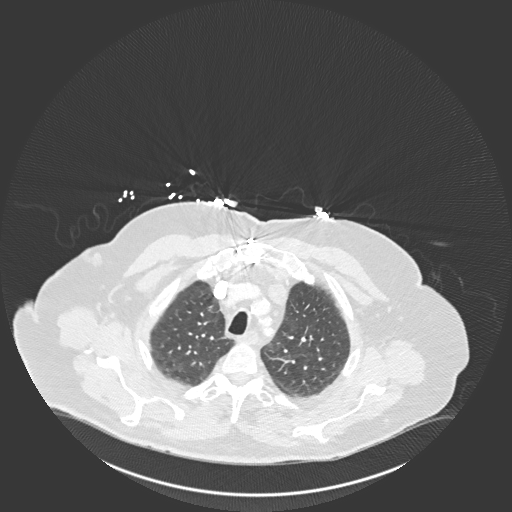
[im 202/245  soft-tissue]
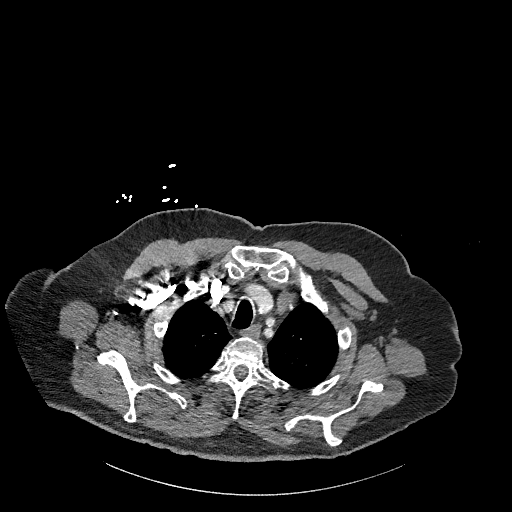
[im 213/245  lung]
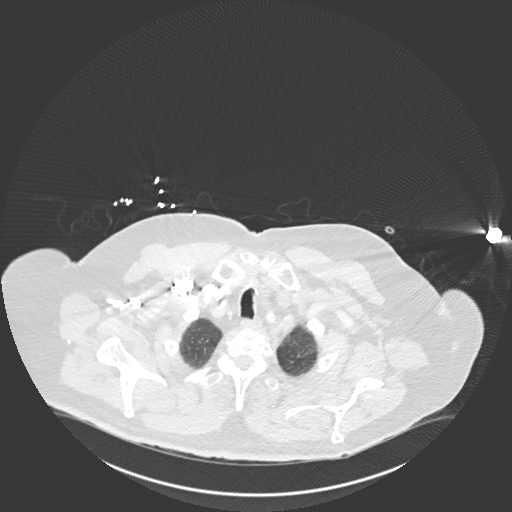
[im 234/245  soft-tissue]
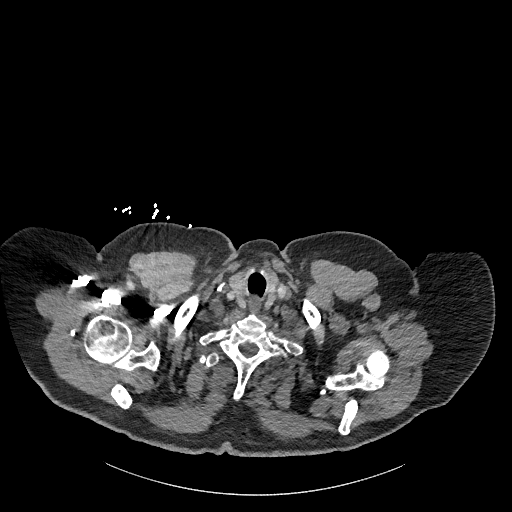

[Series 6: coronal mpr · coronal · 0.50mm/px · 2 of 101 slices shown]
[im 34/101  soft-tissue]
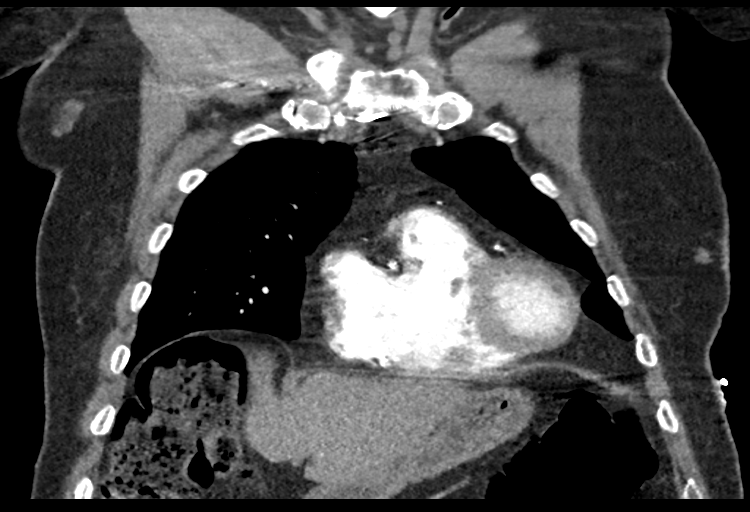
[im 67/101  soft-tissue]
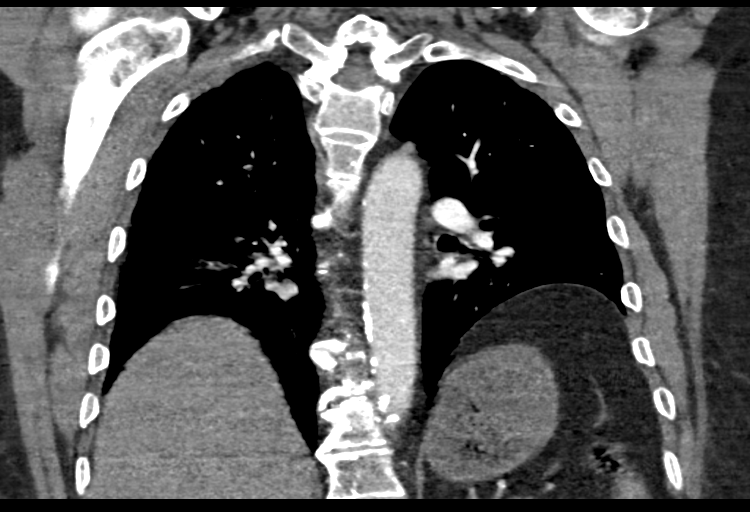

[18 of 46 positions shown; findings below may reference images not displayed]

FINDINGS: Cardiovascular: Cardiomegaly with aortic atherosclerosis and
atherosclerosis at the origin of the great vessels. No pericardial
effusion. Conventional branch pattern of the great vessels without
significant stenosis. The ascending thoracic aorta measures up to
3.8 cm in caliber. Left main and three-vessel moderate coronary
arteriosclerosis is noted. Median sternotomy sutures are in place
with evidence of prior aortic valve replacement. No pulmonary
embolus. No dilatation of the main pulmonary artery.

Mediastinum/Nodes: No enlarged mediastinal, hilar, or axillary lymph
nodes. Thyroid gland, trachea, and esophagus demonstrate no
significant findings.

Lungs/Pleura: Bibasilar dependent and subsegmental atelectasis
within both lower lobes and lingula. No effusion or pneumothorax.

Upper Abdomen: No acute abnormality.

Musculoskeletal: Mild degenerative change of the thoracic spine
without acute osseous appearing abnormality.

Review of the MIP images confirms the above findings.
IMPRESSION: 1. No acute pulmonary embolus.
2. Cardiomegaly with coronary arteriosclerosis. Status post aortic
valve replacement without complicating features.
3. No active pulmonary disease.

Aortic Atherosclerosis ([id number].[id number]).

## 2019-12-15 ENCOUNTER — Telehealth: Payer: Self-pay | Admitting: Gastroenterology

## 2019-12-15 NOTE — Telephone Encounter (Signed)
Prior records received:  Colonoscopy report 08/2013 - Dr. Earle Gell - good prep - normal colonoscopy, no polyps.   Continue plan as outlined in clinic visit, no plans for colonoscopy at this time.

## 2019-12-16 ENCOUNTER — Encounter: Payer: Self-pay | Admitting: Cardiothoracic Surgery

## 2019-12-16 ENCOUNTER — Ambulatory Visit: Payer: Medicare Other | Admitting: Cardiothoracic Surgery

## 2019-12-16 ENCOUNTER — Other Ambulatory Visit: Payer: Self-pay

## 2019-12-16 DIAGNOSIS — Z952 Presence of prosthetic heart valve: Secondary | ICD-10-CM | POA: Diagnosis not present

## 2019-12-16 NOTE — Progress Notes (Signed)
PCP is Wenda Low, MD Referring Provider is Debara Pickett Nadean Corwin, MD  Chief Complaint  Patient presents with  . Follow-up    s/p AVR 01/17/12    HPI: Patient returns for 48-month follow-up.  He is status post AVR for aortic stenosis 2013.  His last echocardiogram was October 2020 which showed normal functioning pericardial AVR without AI and peak gradient 18 mm Hg.  He has no symptoms of CHF or angina.  Patient had a right knee replacement in October 2020 and is still having physical therapy but walking independently without difficulty.  No palpitations orthopnea PND or ankle edema.  Patient is compliant with his medications for hypertension and dyslipidemia.  He plans on traveling with his wife to see family in [place] later this spring.  Past Medical History:  Diagnosis Date  . Arthritis   . Atherosclerosis of abdominal aorta ([place]) 2015  . Carotid artery disease ([place]) 2015  . Constipation   . Deaf, left    WEARS HEARING AID RIGHT EAR   . GERD (gastroesophageal reflux disease)   . Hard of hearing    hearing aid in R ear  . Heart murmur    mod-severe aortic stenosis   . Hyperlipemia   . Hypertension   . Osteoarthritis of knee 2019   Left knee replacement  . Pneumonia    in college  . Prostate enlargement    BPH  . Recurrent upper respiratory infection (URI)    12/2011  . S/P AVR (aortic valve replacement) 01/16/2012   [facility] Ease 36mm pericardial tissue valve    Past Surgical History:  Procedure Laterality Date  . AORTIC VALVE REPLACEMENT  01/17/2012   Procedure: AORTIC VALVE REPLACEMENT (AVR);  Surgeon: Ivin Poot, MD;  Location: [facility];  Service: Open Heart Surgery;  Laterality: N/A;  . CARDIAC CATHETERIZATION  12/17/2011   mild nonobstructive CAD - 20% LAD stenosis, 30% OM stenosis, 20% Cfx, 20% RCA lesion  . JOINT REPLACEMENT     Left total knee Dr. Wynelle Link 06-09-18  . LEFT HEART CATHETERIZATION WITH CORONARY ANGIOGRAM N/A 12/17/2011   Procedure: LEFT HEART  CATHETERIZATION WITH CORONARY ANGIOGRAM;  Surgeon: Pixie Casino, MD;  Location: [facility] CATH LAB;  Service: Cardiovascular;  Laterality: N/A;  . TEE WITHOUT CARDIOVERSION  01/2012   EF 50-55%; calcified AV annulus, severely calcified AV leaflets, mild regurg (AVR)  . TOTAL KNEE ARTHROPLASTY Left 06/09/2018   Procedure: LEFT TOTAL KNEE ARTHROPLASTY;  Surgeon: Gaynelle Arabian, MD;  Location: [facility];  Service: Orthopedics;  Laterality: Left;  . TOTAL KNEE ARTHROPLASTY Right 06/22/2019   Procedure: TOTAL KNEE ARTHROPLASTY;  Surgeon: Gaynelle Arabian, MD;  Location: [facility];  Service: Orthopedics;  Laterality: Right;  32min    Family History  Problem Relation Age of Onset  . Coronary artery disease Father 58       MI/smoker  . Colon cancer Brother   . Ovarian cancer Sister   . COPD Sister     Social History Social History   Tobacco Use  . Smoking status: Never Smoker  . Smokeless tobacco: Never Used  Substance Use Topics  . Alcohol use: Not Currently    Alcohol/week: 1.0 - 2.0 standard drinks    Types: 1 - 2 Standard drinks or equivalent per week    Comment: SOCIAL   . Drug use: Never    Current Outpatient Medications  Medication Sig Dispense Refill  . aspirin EC 81 MG tablet Take 81 mg by mouth daily.    Marland Kitchen  atorvastatin (LIPITOR) 40 MG tablet Take 40 mg by mouth daily.    Marland Kitchen esomeprazole (NEXIUM) 40 MG capsule Take 40 mg by mouth daily at 12 noon.     . hydrocortisone cream 1 % Apply 1 application topically 2 (two) times daily as needed for itching.     . irbesartan (AVAPRO) 75 MG tablet TAKE 1 TABLET BY MOUTH EVERY DAY 30 tablet 1  . polyethylene glycol (MIRALAX / GLYCOLAX) packet Take 17 g by mouth 2 (two) times daily. 14 each 0  . saw palmetto 80 MG capsule Take 80 mg by mouth 2 (two) times daily.    Marland Kitchen senna-docusate (SENOKOT-S) 8.6-50 MG tablet Take 1 tablet by mouth at bedtime. (Patient taking differently: Take 1 tablet by mouth daily as needed for moderate constipation. ) 30 tablet  0  . tadalafil (CIALIS) 10 MG tablet Take 10 mg by mouth daily as needed for erectile dysfunction.      No current facility-administered medications for this visit.    No Known Allergies  Review of Systems  Weight stable Patient and his wife both been vaccinated for COVID-19 No chest pain shortness of breath or ankle edema No abdominal pain or change in bowel habits.  Recent colonoscopy negative No active dental complaints or difficulty swallowing  BP 119/67 (BP Location: Right Arm, Patient Position: Sitting, Cuff Size: Normal)   Pulse 68   Temp 98.2 F (36.8 C) (Temporal)   Resp 20   Ht 5\' 7"  (1.702 m)   Wt 215 lb (97.5 kg)   SpO2 100% Comment: RA  BMI 33.67 kg/m  Physical Exam      Exam    General- alert and comfortable    Neck- no JVD, no cervical adenopathy palpable, no carotid bruit   Lungs- clear without rales, wheezes   Cor- regular rate and rhythm, no murmur , gallop   Abdomen- soft, non-tender   Extremities - warm, non-tender, minimal edema   Neuro- oriented, appropriate, no focal weakness   Diagnostic Tests: None  Impression: Patient doing well after AVR with a pericardial magna ease valve 8 years ago.  No evidence of heart disease at this time.  Plan: Return in 6 months with echocardiogram.  Continue current medications.  Importance of heart healthy lifestyle reviewed with patient and wife including heart healthy diet and 20 minutes of walking daily.   Len Childs, MD [facility] [phone]

## 2019-12-17 ENCOUNTER — Other Ambulatory Visit: Payer: Self-pay

## 2019-12-17 MED ORDER — IRBESARTAN 75 MG PO TABS: 75.0000 mg | ORAL_TABLET | Freq: Every day | ORAL | 0 refills | Status: DC

## 2019-12-18 ENCOUNTER — Other Ambulatory Visit: Payer: Self-pay

## 2019-12-30 ENCOUNTER — Other Ambulatory Visit: Payer: Self-pay | Admitting: Internal Medicine

## 2019-12-31 ENCOUNTER — Other Ambulatory Visit: Payer: Self-pay | Admitting: Internal Medicine

## 2019-12-31 MED ORDER — ATORVASTATIN CALCIUM 40 MG PO TABS: 40.0000 mg | ORAL_TABLET | Freq: Every day | ORAL | 0 refills | Status: DC

## 2019-12-31 MED ORDER — IRBESARTAN 75 MG PO TABS: 75.0000 mg | ORAL_TABLET | Freq: Every day | ORAL | 0 refills | Status: DC

## 2019-12-31 NOTE — Telephone Encounter (Signed)
*  STAT* If patient is at the pharmacy, call can be transferred to refill team.   1. Which medications need to be refilled? (please list name of each medication and dose if known)  irbesartan (AVAPRO) 75 MG tablet atorvastatin (LIPITOR) 40 MG tablet 2. Which pharmacy/location (including street and city if local pharmacy) is medication to be sent to? [id number] - [place], [place] - [address] AT [address]  3. Do they need a 30 day or 90 day supply? 90 day supply

## 2020-01-04 ENCOUNTER — Telehealth: Payer: Medicare Other | Admitting: Physician Assistant

## 2020-01-05 ENCOUNTER — Encounter: Payer: Self-pay | Admitting: Cardiology

## 2020-01-05 ENCOUNTER — Telehealth (INDEPENDENT_AMBULATORY_CARE_PROVIDER_SITE_OTHER): Payer: Medicare Other | Admitting: Cardiology

## 2020-01-05 VITALS — BP 120/73 | HR 68 | Wt 215.0 lb

## 2020-01-05 DIAGNOSIS — I10 Essential (primary) hypertension: Secondary | ICD-10-CM

## 2020-01-05 DIAGNOSIS — Z952 Presence of prosthetic heart valve: Secondary | ICD-10-CM | POA: Diagnosis not present

## 2020-01-05 DIAGNOSIS — H9190 Unspecified hearing loss, unspecified ear: Secondary | ICD-10-CM | POA: Insufficient documentation

## 2020-01-05 DIAGNOSIS — E782 Mixed hyperlipidemia: Secondary | ICD-10-CM

## 2020-01-05 NOTE — Patient Instructions (Signed)
Medication Instructions:  STOP- Xarelto  *If you need a refill on your cardiac medications before your next appointment, please call your pharmacy*   Lab Work: None Ordered   Testing/Procedures: Your physician has requested that you have an echocardiogram. Echocardiography is a painless test that uses sound waves to create images of your heart. It provides your doctor with information about the size and shape of your heart and how well your heart's chambers and valves are working. This procedure takes approximately one hour. There are no restrictions for this procedure.   Follow-Up: At [facility], you and your health needs are our priority.  As part of our continuing mission to provide you with exceptional heart care, we have created designated Provider Care Teams.  These Care Teams include your primary Cardiologist (physician) and Advanced Practice Providers (APPs -  Physician Assistants and Nurse Practitioners) who all work together to provide you with the care you need, when you need it.  We recommend signing up for the patient portal called "MyChart".  Sign up information is provided on this After Visit Summary.  MyChart is used to connect with patients for Virtual Visits (Telemedicine).  Patients are able to view lab/test results, encounter notes, upcoming appointments, etc.  Non-urgent messages can be sent to your provider as well.   To learn more about what you can do with MyChart, go to [email or web address].    Your next appointment:   1 year(s)  The format for your next appointment:   In Person  Provider:   You may see Pixie Casino, MD or one of the following Advanced Practice Providers on your designated Care Team:    Almyra Deforest, PA-C  Fabian Sharp, PA-C or   Roby Lofts, [place]

## 2020-01-05 NOTE — Progress Notes (Signed)
Virtual Visit via Video Note   This visit type was conducted due to national recommendations for restrictions regarding the COVID-19 Pandemic (e.g. social distancing) in an effort to limit this patient's exposure and mitigate transmission in our community.  Due to his co-morbid illnesses, this patient is at least at moderate risk for complications without adequate follow up.  This format is felt to be most appropriate for this patient at this time.  All issues noted in this document were discussed and addressed.  A limited physical exam was performed with this format.  Please refer to the patient's chart for his consent to telehealth for [facility].   The patient was identified using 2 identifiers.  Date:  01/05/2020   ID:  Andrew Bonilla, DOB [birth year], MRN [id number]  Patient Location: Home Provider Location: Home  PCP:  Wenda Low, MD  Cardiologist:  Pixie Casino, MD  Electrophysiologist:  None   Evaluation Performed:  Follow-Up Visit  Chief Complaint:  none  History of Present Illness:    Andrew Bonilla is a 86 y.o. male with history of aortic stenosis.  He had a tissue AVR in May 2013.  Echocardiogram in October 2020 showed a normally functioning prosthetic valve.  Other medical issues include hypertension, dyslipidemia, hard of hearing, and DJD involving his knees.  The patient had a left knee replacement in October 2019.  He did have to go to the emergency room after this after he had what we presume was an orthostatic episode.  He had a right knee replacement in October 2020.  He apparently again had some dizziness in the early postoperative period that sounds like it was orthostatic.  He did not have to go back to the hospital.  Since then he has been doing well.  He continues to get physical therapy.  He just saw Dr. Nils Pyle on 12/16/2019.  He and his wife will be leaving for [place] this week.  They will be there for 5 months.  The patient had no issues or questions  about his medications.  Recent blood pressure was good.  He has had both his Covid vaccines.  The patient does not have symptoms concerning for COVID-19 infection (fever, chills, cough, or new shortness of breath).    Past Medical History:  Diagnosis Date  . Arthritis   . Atherosclerosis of abdominal aorta ([place]) 2015  . Carotid artery disease ([place]) 2015  . Constipation   . Deaf, left    WEARS HEARING AID RIGHT EAR   . GERD (gastroesophageal reflux disease)   . Hard of hearing    hearing aid in R ear  . Heart murmur    mod-severe aortic stenosis   . Hyperlipemia   . Hypertension   . Osteoarthritis of knee 2019   Left knee replacement  . Pneumonia    in college  . Prostate enlargement    BPH  . Recurrent upper respiratory infection (URI)    12/2011  . S/P AVR (aortic valve replacement) 01/16/2012   [facility] Ease 47mm pericardial tissue valve   Past Surgical History:  Procedure Laterality Date  . AORTIC VALVE REPLACEMENT  01/17/2012   Procedure: AORTIC VALVE REPLACEMENT (AVR);  Surgeon: Ivin Poot, MD;  Location: [facility];  Service: Open Heart Surgery;  Laterality: N/A;  . CARDIAC CATHETERIZATION  12/17/2011   mild nonobstructive CAD - 20% LAD stenosis, 30% OM stenosis, 20% Cfx, 20% RCA lesion  . JOINT REPLACEMENT     Left  total knee Dr. Wynelle Link 06-09-18  . LEFT HEART CATHETERIZATION WITH CORONARY ANGIOGRAM N/A 12/17/2011   Procedure: LEFT HEART CATHETERIZATION WITH CORONARY ANGIOGRAM;  Surgeon: Pixie Casino, MD;  Location: [facility] CATH LAB;  Service: Cardiovascular;  Laterality: N/A;  . TEE WITHOUT CARDIOVERSION  01/2012   EF 50-55%; calcified AV annulus, severely calcified AV leaflets, mild regurg (AVR)  . TOTAL KNEE ARTHROPLASTY Left 06/09/2018   Procedure: LEFT TOTAL KNEE ARTHROPLASTY;  Surgeon: Gaynelle Arabian, MD;  Location: [facility];  Service: Orthopedics;  Laterality: Left;  . TOTAL KNEE ARTHROPLASTY Right 06/22/2019   Procedure: TOTAL KNEE ARTHROPLASTY;  Surgeon:  Gaynelle Arabian, MD;  Location: [facility];  Service: Orthopedics;  Laterality: Right;  78min     Current Meds  Medication Sig  . amoxicillin (AMOXIL) 500 MG tablet amoxicillin 500 mg tablet  4 tablets 1 hour prior to dental procedure  . aspirin EC 81 MG tablet Take 81 mg by mouth daily.  Marland Kitchen atorvastatin (LIPITOR) 40 MG tablet Take 1 tablet (40 mg total) by mouth daily.  Marland Kitchen esomeprazole (NEXIUM) 40 MG capsule Take 40 mg by mouth daily at 12 noon.   . hydrocortisone cream 1 % Apply 1 application topically 2 (two) times daily as needed for itching.   . irbesartan (AVAPRO) 75 MG tablet Take 1 tablet (75 mg total) by mouth daily.  . polyethylene glycol (MIRALAX / GLYCOLAX) packet Take 17 g by mouth 2 (two) times daily.  . rivaroxaban (XARELTO) 10 MG TABS tablet Xarelto 10 mg tablet   10 mg by oral route.  . saw palmetto 80 MG capsule Take 80 mg by mouth 2 (two) times daily.  . tadalafil (CIALIS) 10 MG tablet Take 10 mg by mouth daily as needed for erectile dysfunction.   . [DISCONTINUED] azithromycin (ZITHROMAX) 250 MG tablet Take 250 mg by mouth as directed.  . [DISCONTINUED] irbesartan (AVAPRO) 75 MG tablet TAKE 1 TABLET BY MOUTH EVERY DAY  . [DISCONTINUED] senna-docusate (SENOKOT-S) 8.6-50 MG tablet Take 1 tablet by mouth at bedtime. (Patient taking differently: Take 1 tablet by mouth daily as needed for moderate constipation. )     Allergies:   Patient has no known allergies.   Social History   Tobacco Use  . Smoking status: Never Smoker  . Smokeless tobacco: Never Used  Substance Use Topics  . Alcohol use: Not Currently    Alcohol/week: 1.0 - 2.0 standard drinks    Types: 1 - 2 Standard drinks or equivalent per week    Comment: SOCIAL   . Drug use: Never     Family Hx: The patient's family history includes COPD in his sister; Colon cancer in his brother; Coronary artery disease (age of onset: 40) in his father; Ovarian cancer in his sister.  ROS:   Please see the history of present  illness.    All other systems reviewed and are negative.   Prior CV studies:   The following studies were reviewed today:  Echo Oct 2020- IMPRESSIONS    1. Left ventricular ejection fraction, by visual estimation, is 60 to  65%. The left ventricle has normal function. Normal left ventricular size.  There is no left ventricular hypertrophy.  2. Global right ventricle has normal systolic function.The right  ventricular size is normal. No increase in right ventricular wall  thickness.  3. Left atrial size was normal.  4. Right atrial size was normal.  5. Mild calcification of the anterior mitral valve leaflet(s). Mild  thickening of the anterior mitral  valve leaflet(s). Severe mitral annular  calcification.The tricuspid valve is normal in structure. Tricuspid valve  regurgitation is trivial.  6. Aortic valve regurgitation was not visualized by color flow Doppler.  Structurally normal aortic valve, with no evidence of sclerosis or  stenosis.  7. The pulmonic valve was normal in structure. Pulmonic valve  regurgitation is not visualized by color flow Doppler.  8. Normal pulmonary artery systolic pressure.  9. The inferior vena cava is normal in size with greater than 50%  respiratory variability, suggesting right atrial pressure of 3 mmHg.   Labs/Other Tests and Data Reviewed:    EKG:  An ECG dated 06/25/2019 was personally reviewed today and demonstrated:  NSR-77- NSST changes  Recent Labs: 06/15/2019: ALT 22 06/25/2019: Magnesium 1.8 06/26/2019: BUN 13; Creatinine, Ser 0.66; Potassium 3.6; Sodium 129 11/25/2019: Hemoglobin 12.9; Platelets 261.0; TSH 1.22   Recent Lipid Panel Lab Results  Component Value Date/Time   CHOL 146 01/07/2019 11:02 AM   TRIG 64 01/07/2019 11:02 AM   HDL 68 01/07/2019 11:02 AM   CHOLHDL 2.1 01/07/2019 11:02 AM   LDLCALC 65 01/07/2019 11:02 AM    Wt Readings from Last 3 Encounters:  01/05/20 215 lb (97.5 kg)  12/16/19 215 lb (97.5 kg)   11/25/19 212 lb 6.4 oz (96.3 kg)     Objective:    Vital Signs:  BP 120/73 Comment: no new vitals for todays vitals taken yesterday  Pulse 68 Comment: no new vitals for today vitals taken yesterday  Wt 215 lb (97.5 kg)   BMI 33.67 kg/m    VITAL SIGNS:  reviewed  ASSESSMENT & PLAN:    S/P tissue AVR- May 2013- doing well. I will order an echo for Nov 2021 prior to his f/u with Dr Lucianne Lei Tright  HTN- Controlled on current medications  HLD- LDL 65 May 2020-on Lipitor 40mg    DJD- LTKR Oct 2019, RTKR Oct 2020  [facility]- Hearing aids  Plan- no change in Rx- f/u echo in 6 months- f/u with Dr Debara Pickett May 2022.   COVID-19 Education: The signs and symptoms of COVID-19 were discussed with the patient and how to seek care for testing (follow up with PCP or arrange E-visit).  The importance of social distancing was discussed today.  Time:   Today, I have spent 20 minutes with the patient with telehealth technology discussing the above problems.     Medication Adjustments/Labs and Tests Ordered: Current medicines are reviewed at length with the patient today.  Concerns regarding medicines are outlined above.   Tests Ordered: No orders of the defined types were placed in this encounter.   Medication Changes: No orders of the defined types were placed in this encounter.   Follow Up:  In Person Dr Debara Pickett in one year  Signed, Kerin Ransom, Hershal Coria  01/05/2020 9:56 AM    [place]

## 2020-02-09 ENCOUNTER — Encounter (HOSPITAL_COMMUNITY): Payer: Medicare Other

## 2020-02-12 ENCOUNTER — Encounter (HOSPITAL_COMMUNITY): Payer: Medicare Other

## 2020-02-24 ENCOUNTER — Ambulatory Visit (HOSPITAL_COMMUNITY)
Admission: RE | Admit: 2020-02-24 | Payer: Medicare Other | Source: Ambulatory Visit | Attending: Internal Medicine | Admitting: Internal Medicine

## 2020-03-10 ENCOUNTER — Other Ambulatory Visit: Payer: Self-pay

## 2020-03-10 MED ORDER — IRBESARTAN 75 MG PO TABS: 75.0000 mg | ORAL_TABLET | Freq: Every day | ORAL | 0 refills | Status: DC

## 2020-03-31 NOTE — Telephone Encounter (Signed)
error 

## 2020-06-20 ENCOUNTER — Telehealth: Payer: Self-pay

## 2020-06-20 NOTE — Telephone Encounter (Signed)
   [facility] Pre-operative Risk Assessment    Request for surgical clearance:  1. What type of surgery is being performed? LUMBAR ESI   2. When is this surgery scheduled? 07-09-2020  3. What type of clearance is required (medical clearance vs. Pharmacy clearance to hold med vs. Both)? MEDICATION  4. Are there any medications that need to be held prior to surgery and how long? XARELTO 3 DAYS PRIOR   5. Practice name and name of physician performing surgery? [facility] ATTN:LYNNE   6. What is the office phone number? [phone]   7.   What is the office fax number? [phone]  8.   Anesthesia type (None, local, MAC, general) ? MAC

## 2020-06-20 NOTE — Telephone Encounter (Signed)
   Primary Cardiologist: Pixie Casino, MD  Chart reviewed as part of pre-operative protocol coverage. Patient was contacted 06/20/2020 in reference to pre-operative risk assessment for pending surgery as outlined below.  Andrew Bonilla was last seen on 01/05/20 by Kerin Ransom PAC.  Since that day, Andrew Bonilla has done well. He can complete 4.0 METS without angina. He is not on anticoagulation.  Therefore, based on ACC/AHA guidelines, the patient would be at acceptable risk for the planned procedure without further cardiovascular testing.   The patient was advised that if he develops new symptoms prior to surgery to contact our office to arrange for a follow-up visit, and he verbalized understanding.  I will route this recommendation to the requesting party via Epic fax function and remove from pre-op pool. Please call with questions.  Tami Lin Athira Janowicz, PA 06/20/2020, 3:30 PM

## 2020-06-21 ENCOUNTER — Ambulatory Visit
Admission: RE | Admit: 2020-06-21 | Discharge: 2020-06-21 | Disposition: A | Discharge status: Home / Self Care | Payer: Medicare Other | Source: Ambulatory Visit | Attending: Internal Medicine | Admitting: Internal Medicine

## 2020-06-21 ENCOUNTER — Other Ambulatory Visit: Payer: Self-pay | Admitting: Internal Medicine

## 2020-06-21 DIAGNOSIS — R059 Cough, unspecified: Secondary | ICD-10-CM

## 2020-06-24 ENCOUNTER — Other Ambulatory Visit: Payer: Self-pay | Admitting: Internal Medicine

## 2020-06-24 ENCOUNTER — Other Ambulatory Visit: Payer: Self-pay

## 2020-06-24 MED ORDER — IRBESARTAN 75 MG PO TABS: 75.0000 mg | ORAL_TABLET | Freq: Every day | ORAL | 6 refills | Status: DC

## 2020-06-24 NOTE — Telephone Encounter (Signed)
*  STAT* If patient is at the pharmacy, call can be transferred to refill team.   1. Which medications need to be refilled? (please list name of each medication and dose if known)  irbesartan (AVAPRO) 75 MG tablet  2. Which pharmacy/location (including street and city if local pharmacy) is medication to be sent to? [id number] [place] [place] - [address] AT [place]  3. Do they need a 30 day or 90 day supply? 90 day supply

## 2020-07-13 ENCOUNTER — Other Ambulatory Visit: Payer: Self-pay

## 2020-07-13 ENCOUNTER — Ambulatory Visit (HOSPITAL_COMMUNITY): Payer: Medicare Other | Attending: Cardiology

## 2020-07-13 DIAGNOSIS — Z952 Presence of prosthetic heart valve: Secondary | ICD-10-CM | POA: Insufficient documentation

## 2020-07-13 LAB — ECHOCARDIOGRAM COMPLETE
AV Mean grad: 8.4 mmHg
AV Peak grad: 14.2 mmHg
Ao pk vel: 1.88 m/s
Area-P 1/2: 2.76 cm2
S' Lateral: 2.9 cm

## 2020-07-20 ENCOUNTER — Ambulatory Visit: Payer: Medicare Other | Admitting: Cardiothoracic Surgery

## 2020-07-20 ENCOUNTER — Other Ambulatory Visit: Payer: Self-pay

## 2020-07-20 ENCOUNTER — Encounter: Payer: Self-pay | Admitting: Cardiothoracic Surgery

## 2020-07-20 ENCOUNTER — Other Ambulatory Visit (HOSPITAL_COMMUNITY): Payer: Medicare Other

## 2020-07-20 VITALS — BP 105/51 | HR 76 | Resp 18 | Wt 208.8 lb

## 2020-07-20 DIAGNOSIS — Z8679 Personal history of other diseases of the circulatory system: Secondary | ICD-10-CM | POA: Diagnosis not present

## 2020-07-20 DIAGNOSIS — Z952 Presence of prosthetic heart valve: Secondary | ICD-10-CM

## 2020-07-20 NOTE — Progress Notes (Signed)
PCP is Wenda Low, MD Referring Provider is Debara Pickett Nadean Corwin, MD  Chief Complaint  Patient presents with  . Follow-up    hx of AVR 2013, f/u ECHO 07/13/20    HPI: 86 year old male who returns for routine follow-up 8 years after AVR with a Edwards magna ease valve for aortic stenosis with syncopal symptoms.  He was referred by his family friend Dr. Elsie Stain, cardiac surgeon in [place]. He remains in sinus rhythm.  Echocardiogram last month shows normal LV function with a mean gradient across the valve of 8 mmHg and no AI.  He denies any symptoms of chest pain.  His coronaries were clean at the time of cardiac catheterization.  He has been through bilateral total knee replacement surgery Without cardiac complications.  His blood pressure has been normal.  Now has low back pain and is receiving injections and physical therapy.  Past Medical History:  Diagnosis Date  . Arthritis   . Atherosclerosis of abdominal aorta ([place]) 2015  . Carotid artery disease ([place]) 2015  . Constipation   . Deaf, left    WEARS HEARING AID RIGHT EAR   . GERD (gastroesophageal reflux disease)   . Hard of hearing    hearing aid in R ear  . Heart murmur    mod-severe aortic stenosis   . Hyperlipemia   . Hypertension   . Osteoarthritis of knee 2019   Left knee replacement  . Pneumonia    in college  . Prostate enlargement    BPH  . Recurrent upper respiratory infection (URI)    12/2011  . S/P AVR (aortic valve replacement) 01/16/2012   [facility] Ease 5mm pericardial tissue valve    Past Surgical History:  Procedure Laterality Date  . AORTIC VALVE REPLACEMENT  01/17/2012   Procedure: AORTIC VALVE REPLACEMENT (AVR);  Surgeon: Ivin Poot, MD;  Location: [place];  Service: Open Heart Surgery;  Laterality: N/A;  . CARDIAC CATHETERIZATION  12/17/2011   mild nonobstructive CAD - 20% LAD stenosis, 30% OM stenosis, 20% Cfx, 20% RCA lesion  . JOINT REPLACEMENT     Left total knee Dr. Wynelle Link  06-09-18  . LEFT HEART CATHETERIZATION WITH CORONARY ANGIOGRAM N/A 12/17/2011   Procedure: LEFT HEART CATHETERIZATION WITH CORONARY ANGIOGRAM;  Surgeon: Pixie Casino, MD;  Location: [facility] CATH LAB;  Service: Cardiovascular;  Laterality: N/A;  . TEE WITHOUT CARDIOVERSION  01/2012   EF 50-55%; calcified AV annulus, severely calcified AV leaflets, mild regurg (AVR)  . TOTAL KNEE ARTHROPLASTY Left 06/09/2018   Procedure: LEFT TOTAL KNEE ARTHROPLASTY;  Surgeon: Gaynelle Arabian, MD;  Location: [facility];  Service: Orthopedics;  Laterality: Left;  . TOTAL KNEE ARTHROPLASTY Right 06/22/2019   Procedure: TOTAL KNEE ARTHROPLASTY;  Surgeon: Gaynelle Arabian, MD;  Location: [facility];  Service: Orthopedics;  Laterality: Right;  31min    Family History  Problem Relation Age of Onset  . Coronary artery disease Father 42       MI/smoker  . Colon cancer Brother   . Ovarian cancer Sister   . COPD Sister     Social History Social History   Tobacco Use  . Smoking status: Never Smoker  . Smokeless tobacco: Never Used  Vaping Use  . Vaping Use: Never used  Substance Use Topics  . Alcohol use: Not Currently    Alcohol/week: 1.0 - 2.0 standard drink    Types: 1 - 2 Standard drinks or equivalent per week    Comment: SOCIAL   .  Drug use: Never    Current Outpatient Medications  Medication Sig Dispense Refill  . amoxicillin (AMOXIL) 500 MG tablet amoxicillin 500 mg tablet  4 tablets 1 hour prior to dental procedure    . aspirin EC 81 MG tablet Take 81 mg by mouth daily.    Marland Kitchen atorvastatin (LIPITOR) 40 MG tablet Take 1 tablet (40 mg total) by mouth daily. 30 tablet 0  . esomeprazole (NEXIUM) 40 MG capsule Take 40 mg by mouth daily at 12 noon.     . hydrocortisone cream 1 % Apply 1 application topically 2 (two) times daily as needed for itching.     . irbesartan (AVAPRO) 75 MG tablet Take 1 tablet (75 mg total) by mouth daily. 30 tablet 6  . polyethylene glycol (MIRALAX / GLYCOLAX) packet Take 17 g by mouth 2  (two) times daily. 14 each 0  . saw palmetto 80 MG capsule Take 80 mg by mouth 2 (two) times daily.    . tadalafil (CIALIS) 10 MG tablet Take 10 mg by mouth daily as needed for erectile dysfunction.      No current facility-administered medications for this visit.    No Known Allergies  Review of Systems  No significant changes other than low back pain for which she is receiving injections and physical therapy  BP (!) 105/51 (BP Location: Left Arm, Patient Position: Sitting)   Pulse 76   Resp 18   Wt 208 lb 12.8 oz (94.7 kg)   SpO2 97%   BMI 32.70 kg/m  Physical Exam      Exam    General- alert and comfortable    Neck- no JVD, no cervical adenopathy palpable, no carotid bruit   Lungs- clear without rales, wheezes   Cor- regular rate and rhythm, no murmur , gallop   Abdomen- soft, non-tender   Extremities - warm, non-tender, minimal edema   Neuro- oriented, appropriate, no focal weakness  Diagnostic Tests: Echocardiogram images personally reviewed showing normal function of his bioprosthetic aortic valve.  Normal biventricular function.  Impression: Doing well almost 9 years following AVR. Continue current medications. Heart healthy lifestyle reviewed with patient including regular activity (walking) and heart healthy diet. Plan: Return for routine follow-up in 6 months with one of my  CTS partners-the patient understands I will be retiring in January 2022.   Len Childs, MD [facility] [phone]

## 2020-09-23 ENCOUNTER — Other Ambulatory Visit: Payer: Self-pay | Admitting: Internal Medicine

## 2020-12-08 ENCOUNTER — Other Ambulatory Visit: Payer: Self-pay | Admitting: Internal Medicine

## 2021-03-09 ENCOUNTER — Other Ambulatory Visit: Payer: Self-pay | Admitting: Internal Medicine

## 2021-05-08 ENCOUNTER — Other Ambulatory Visit: Payer: Self-pay

## 2021-05-08 ENCOUNTER — Encounter (HOSPITAL_COMMUNITY): Payer: Self-pay

## 2021-05-08 ENCOUNTER — Ambulatory Visit (HOSPITAL_COMMUNITY)
Admission: EM | Admit: 2021-05-08 | Discharge: 2021-05-08 | Disposition: A | Discharge status: Home / Self Care | Payer: Medicare Other | Attending: Physician Assistant | Admitting: Physician Assistant

## 2021-05-08 DIAGNOSIS — R059 Cough, unspecified: Secondary | ICD-10-CM | POA: Diagnosis not present

## 2021-05-08 DIAGNOSIS — U071 COVID-19: Secondary | ICD-10-CM | POA: Diagnosis not present

## 2021-05-08 MED ORDER — BENZONATATE 100 MG PO CAPS: 100.0000 mg | ORAL_CAPSULE | Freq: Three times a day (TID) | ORAL | 0 refills | Status: DC

## 2021-05-08 NOTE — ED Triage Notes (Signed)
Pt reports + COVID test (home), yesterday. Pt reports cough x 2 days. Pt started azithromycin 1 day ago.

## 2021-05-08 NOTE — ED Provider Notes (Addendum)
[place]    CSN: [id number] Arrival date & time: 05/08/21  0913      History   Chief Complaint Chief Complaint  Patient presents with   Cough    + COVID test (home)    HPI Andrew Bonilla is a 87 y.o. male.   Pt complains of cough and congestion that started aboutu three days after a trip to [place].  Denies fever, chills, shortness of breath.  He has had COVID vaccine and booster. No h/o lung disease.  He is taking azithromycin, which he started three days ago.  Reports sx have been improving since onset.  Tested positive for COVID on home test yesterday.    Past Medical History:  Diagnosis Date   Arthritis    Atherosclerosis of abdominal aorta ([place]) 2015   Carotid artery disease ([place]) 2015   Constipation    Deaf, left    WEARS HEARING AID RIGHT EAR    GERD (gastroesophageal reflux disease)    Hard of hearing    hearing aid in R ear   Heart murmur    mod-severe aortic stenosis    Hyperlipemia    Hypertension    Osteoarthritis of knee 2019   Left knee replacement   Pneumonia    in college   Prostate enlargement    BPH   Recurrent upper respiratory infection (URI)    12/2011   S/P AVR (aortic valve replacement) 01/16/2012   [facility] Ease 19m pericardial tissue valve    Patient Active Problem List   Diagnosis Date Noted   History of valvular heart disease 07/20/2020   HOH (hard of hearing) 01/05/2020   History of aortic valve replacement 12/16/2019   Syncope 06/13/2018   Normocytic anemia 06/13/2018   Hyponatremia 06/13/2018   Osteoarthritis of knee 06/10/2018   OA (osteoarthritis) of knee 06/09/2018   Bilateral carotid artery stenosis 11/30/2016   Chest wall pain 09/13/2015   Obesity (BMI 35.0-39.9 without comorbidity) 08/04/2014   Cough 08/21/2013   Near syncope 08/21/2013   Bradycardia 08/21/2013   Essential hypertension 12/15/2011   Mixed hyperlipidemia 12/15/2011   Constipation 12/15/2011   History of prosthetic aortic valve  12/15/2011    Past Surgical History:  Procedure Laterality Date   AORTIC VALVE REPLACEMENT  01/17/2012   Procedure: AORTIC VALVE REPLACEMENT (AVR);  Surgeon: PIvin Poot MD;  [place]  Service: Open Heart Surgery;  Laterality: N/A;   CARDIAC CATHETERIZATION  12/17/2011   mild nonobstructive CAD - 20% LAD stenosis, 30% OM stenosis, 20% Cfx, 20% RCA lesion   JOINT REPLACEMENT     Left total knee Dr. AWynelle Link10-7-19   LEFT HEART CATHETERIZATION WITH CORONARY ANGIOGRAM N/A 12/17/2011   Procedure: LEFT HEART CATHETERIZATION WITH CORONARY ANGIOGRAM;  Surgeon: KPixie Casino MD;  [facility];  Service: Cardiovascular;  Laterality: N/A;   TEE WITHOUT CARDIOVERSION  01/2012   EF 50-55%; calcified AV annulus, severely calcified AV leaflets, mild regurg (AVR)   TOTAL KNEE ARTHROPLASTY Left 06/09/2018   Procedure: LEFT TOTAL KNEE ARTHROPLASTY;  Surgeon: AGaynelle Arabian MD;  [facility];  Service: Orthopedics;  Laterality: Left;   TOTAL KNEE ARTHROPLASTY Right 06/22/2019   Procedure: TOTAL KNEE ARTHROPLASTY;  Surgeon: AGaynelle Arabian MD;  [facility];  Service: Orthopedics;  Laterality: Right;  574m       Home Medications    Prior to Admission medications   Medication Sig Start Date End Date Taking? Authorizing Provider  amoxicillin (AMOXIL) 500  MG tablet amoxicillin 500 mg tablet  4 tablets 1 hour prior to dental procedure    [provider]  aspirin EC 81 MG tablet Take 81 mg by mouth daily.    [provider]  atorvastatin (LIPITOR) 40 MG tablet TAKE 1 TABLET BY MOUTH EVERY DAY 09/23/20   Hilty, Nadean Corwin, MD  esomeprazole (NEXIUM) 40 MG capsule Take 40 mg by mouth daily at 12 noon.     [provider]  hydrocortisone cream 1 % Apply 1 application topically 2 (two) times daily as needed for itching.     [provider]  irbesartan (AVAPRO) 75 MG tablet TAKE 1 TABLET BY MOUTH EVERY DAY 03/09/21   Hilty, Nadean Corwin, MD   polyethylene glycol (MIRALAX / GLYCOLAX) packet Take 17 g by mouth 2 (two) times daily. 06/15/18   Raiford Noble Latif, DO  saw palmetto 80 MG capsule Take 80 mg by mouth 2 (two) times daily.    [provider]  tadalafil (CIALIS) 10 MG tablet Take 10 mg by mouth daily as needed for erectile dysfunction.     [provider]    Family History Family History  Problem Relation Age of Onset   Coronary artery disease Father 3       MI/smoker   Colon cancer Brother    Ovarian cancer Sister    COPD Sister     Social History Social History   Tobacco Use   Smoking status: Never   Smokeless tobacco: Never  Vaping Use   Vaping Use: Never used  Substance Use Topics   Alcohol use: Not Currently    Alcohol/week: 1.0 - 2.0 standard drink    Types: 1 - 2 Standard drinks or equivalent per week    Comment: SOCIAL    Drug use: Never     Allergies   Patient has no known allergies.   Review of Systems Review of Systems  Constitutional:  Negative for chills and fever.  HENT:  Positive for congestion. Negative for ear pain and sore throat.   Eyes:  Negative for pain and visual disturbance.  Respiratory:  Positive for cough. Negative for shortness of breath.   Cardiovascular:  Negative for chest pain and palpitations.  Gastrointestinal:  Negative for abdominal pain and vomiting.  Genitourinary:  Negative for dysuria and hematuria.  Musculoskeletal:  Negative for arthralgias and back pain.  Skin:  Negative for color change and rash.  Neurological:  Negative for seizures and syncope.  All other systems reviewed and are negative.   Physical Exam Triage Vital Signs ED Triage Vitals  Enc Vitals Group     BP 05/08/21 0949 (!) 154/75     Pulse Rate 05/08/21 0949 74     Resp 05/08/21 0949 20     Temp 05/08/21 0949 98.3 F (36.8 C)     Temp Source 05/08/21 0949 Oral     SpO2 05/08/21 0949 97 %     Weight --      Height --      Head Circumference --      Peak Flow  --      Pain Score 05/08/21 0948 0     Pain Loc --      Pain Edu? --      Excl. in [place]? --    No data found.  Updated Vital Signs BP (!) 154/75 (BP Location: Right Arm)   Pulse 74   Temp 98.3 F (36.8 C) (Oral)   Resp 20  SpO2 97%   Visual Acuity Right Eye Distance:   Left Eye Distance:   Bilateral Distance:    Right Eye Near:   Left Eye Near:    Bilateral Near:     Physical Exam Vitals and nursing note reviewed.  Constitutional:      Appearance: He is well-developed.  HENT:     Head: Normocephalic and atraumatic.  Eyes:     Conjunctiva/sclera: Conjunctivae normal.  Cardiovascular:     Rate and Rhythm: Normal rate and regular rhythm.     Heart sounds: No murmur heard. Pulmonary:     Effort: Pulmonary effort is normal. No respiratory distress.     Breath sounds: Normal breath sounds.  Abdominal:     Palpations: Abdomen is soft.     Tenderness: There is no abdominal tenderness.  Musculoskeletal:     Cervical back: Neck supple.  Skin:    General: Skin is warm and dry.  Neurological:     Mental Status: He is alert.     UC Treatments / Results  Labs (all labs ordered are listed, but only abnormal results are displayed) Labs Reviewed - No data to display  EKG   Radiology No results found.  Procedures Procedures (including critical care time)  Medications Ordered in UC Medications - No data to display  Initial Impression / Assessment and Plan / UC Course  I have reviewed the triage vital signs and the nursing notes.  Pertinent labs & imaging results that were available during my care of the patient were reviewed by me and considered in my medical decision making (see chart for details).     Pt well appearing, in no acute distress.  Vitals wnl. Advised continued supportive care.  Return precautions discussed. Continue to monitor O2 at home.  Final Clinical Impressions(s) / UC Diagnoses   Final diagnoses:  None   Discharge Instructions   None     ED Prescriptions   None    PDMP not reviewed this encounter.   Ward, Lenise Arena, PA-C 05/08/21 1056    Ward, Lenise Arena, PA-C 05/08/21 1059

## 2021-05-08 NOTE — Discharge Instructions (Addendum)
Recommend Flonase daily Can take Coricidin and Mucinex  Rest and drink plenty of fluids Self isolate for 5 from symptom onset and then wear a mask around others for 5 days.  Return for evaluation if symptoms worsen Continue to check oxygen saturation at home.

## 2021-05-15 ENCOUNTER — Telehealth: Payer: Self-pay | Admitting: Internal Medicine

## 2021-05-15 NOTE — Telephone Encounter (Signed)
*  STAT* If patient is at the pharmacy, call can be transferred to refill team.   1. Which medications need to be refilled? (please list name of each medication and dose if known)  atorvastatin (LIPITOR) 40 MG tablet  2. Which pharmacy/location (including street and city if local pharmacy) is medication to be sent to? [id number] [place] [place] - [address]. Do they need a 30 day or 90 day supply? 90 day supply

## 2021-06-29 ENCOUNTER — Encounter: Payer: Self-pay | Admitting: Emergency Medicine

## 2021-06-29 ENCOUNTER — Ambulatory Visit: Payer: Medicare Other | Admitting: Emergency Medicine

## 2021-06-29 ENCOUNTER — Other Ambulatory Visit: Payer: Self-pay

## 2021-06-29 DIAGNOSIS — R053 Chronic cough: Secondary | ICD-10-CM | POA: Diagnosis not present

## 2021-06-29 NOTE — Patient Instructions (Signed)
Continue your Nexium twice a day for now.  We can talk about decreasing back to once a day at your next visit.  Take this medication 1 hour around food. Start loratadine 10 mg once daily.  (Generic) Stop Jarrett Ables to continue to use your codeine cough syrup in the evening for cough suppression We will perform pulmonary function testing at your next office visit. Follow with Dr. Lamonte Sakai next available with full pulmonary function testing on the same day.

## 2021-06-29 NOTE — Assessment & Plan Note (Signed)
Based on clinical description sounds like upper airway irritation syndrome.  He had COVID-19 6 weeks ago, probably another URI in the interim.  Sustaining factors include GERD, some degree of residual rhinitis (although better).  He was started recently on Breo, codeine cough syrup, had his Nexium increased to twice daily.  In absence of clear obstructive lung disease I think we can stop the [facility], try to concentrate on upper airway irritants.  We will continue the increased dose Nexium, continue cough suppression, add loratadine.  Check pulmonary function testing to assess for obstructive lung disease.

## 2021-06-29 NOTE — Progress Notes (Signed)
Subjective:    Patient ID: Andrew Bonilla, male    DOB: [birth year], 87 y.o.   MRN: [id number]  HPI 87 year old never smoker with a history of CAD, aortic stenosis/AVR, hypertension (on ARB), BPH, GERD. He is referred today for evaluation of chronic cough. He was well until early September, does not have a baseline cough.  He had just returned from a trip to [place].  At that time he was diagnosed with COVID-19.  He was treated with azithromycin for possible superimposed bronchitis.  No chest x-ray was done at the time.  His symptoms included URI, cold sx, congestion and drainage. These are improved, but still w some residual nasal drainage - clear. Coughing sometimes during day, white thick phlegm. Was coughing frequently into the night, has awakened him a few times.  Has received abx, cough syrup/codeine, prednisone. Was just started on Breo a few days ago. His Nexium was just increased to bid a few days ago as well.   Chest x-ray 06/21/2020 reviewed by me showed normal heart size, no evidence of CHF, some mild peribronchial cuffing, mild left perihilar opacity.   Review of Systems As per HPI  Past Medical History:  Diagnosis Date   Arthritis    Atherosclerosis of abdominal aorta ([place]) 2015   Carotid artery disease ([place]) 2015   Constipation    Deaf, left    WEARS HEARING AID RIGHT EAR    GERD (gastroesophageal reflux disease)    Hard of hearing    hearing aid in R ear   Heart murmur    mod-severe aortic stenosis    Hyperlipemia    Hypertension    Osteoarthritis of knee 2019   Left knee replacement   Pneumonia    in college   Prostate enlargement    BPH   Recurrent upper respiratory infection (URI)    12/2011   S/P AVR (aortic valve replacement) 01/16/2012   [facility] Ease 32mm pericardial tissue valve     Family History  Problem Relation Age of Onset   Coronary artery disease Father 46       MI/smoker   Colon cancer Brother    Ovarian cancer Sister    COPD Sister       Social History   Socioeconomic History   Marital status: Married    Spouse name: Not on file   Number of children: 4   Years of education: Not on file   Highest education level: Not on file  Occupational History   Occupation: Engineer, water  Tobacco Use   Smoking status: Never   Smokeless tobacco: Never  Vaping Use   Vaping Use: Never used  Substance and Sexual Activity   Alcohol use: Not Currently    Alcohol/week: 1.0 - 2.0 standard drink    Types: 1 - 2 Standard drinks or equivalent per week    Comment: SOCIAL    Drug use: Never   Sexual activity: Yes  Other Topics Concern   Not on file  Social History Narrative   Retired Arts development officer   Social Determinants of Radio broadcast assistant Strain: Not on file  Food Insecurity: Not on file  Transportation Needs: Not on file  Physical Activity: Not on file  Stress: Not on file  Social Connections: Not on file  Intimate Partner Violence: Not on file     No Known Allergies   Outpatient Medications Prior to Visit  Medication Sig Dispense Refill   amoxicillin (AMOXIL) 500 MG tablet amoxicillin 500  mg tablet  4 tablets 1 hour prior to dental procedure     aspirin EC 81 MG tablet Take 81 mg by mouth daily.     atorvastatin (LIPITOR) 40 MG tablet TAKE 1 TABLET BY MOUTH EVERY DAY 30 tablet 11   benzonatate (TESSALON) 100 MG capsule Take 1 capsule (100 mg total) by mouth every 8 (eight) hours. 21 capsule 0   esomeprazole (NEXIUM) 40 MG capsule Take 40 mg by mouth daily at 12 noon.      hydrocortisone cream 1 % Apply 1 application topically 2 (two) times daily as needed for itching.      irbesartan (AVAPRO) 75 MG tablet TAKE 1 TABLET BY MOUTH EVERY DAY 90 tablet 3   polyethylene glycol (MIRALAX / GLYCOLAX) packet Take 17 g by mouth 2 (two) times daily. 14 each 0   saw palmetto 80 MG capsule Take 80 mg by mouth 2 (two) times daily.     tadalafil (CIALIS) 10 MG tablet Take 10 mg by mouth daily as needed for  erectile dysfunction.      No facility-administered medications prior to visit.        Objective:   Physical Exam Vitals:   06/29/21 1359  BP: 114/84  Pulse: 70  SpO2: 97%  Weight: 203 lb 9.6 oz (92.4 kg)  Height: 5\' 7"  (1.702 m)    Gen: Pleasant, well-nourished elderly man, in no distress,  normal affect  ENT: No lesions,  mouth clear,  oropharynx clear, no postnasal drip  Neck: No JVD, no stridor  Lungs: No use of accessory muscles, no crackles or wheezing on normal respiration, no wheeze on forced expiration  Cardiovascular: RRR, heart sounds normal, no murmur or gallops, no peripheral edema  Musculoskeletal: No deformities, no cyanosis or clubbing  Neuro: alert, awake, non focal  Skin: Warm, no lesions or rash      Assessment & Plan:  Cough Based on clinical description sounds like upper airway irritation syndrome.  He had COVID-19 6 weeks ago, probably another URI in the interim.  Sustaining factors include GERD, some degree of residual rhinitis (although better).  He was started recently on Breo, codeine cough syrup, had his Nexium increased to twice daily.  In absence of clear obstructive lung disease I think we can stop the [facility], try to concentrate on upper airway irritants.  We will continue the increased dose Nexium, continue cough suppression, add loratadine.  Check pulmonary function testing to assess for obstructive lung disease.   Baltazar Apo, MD, PhD 06/29/2021, 2:31 PM [facility] [phone] or if no answer before 7:00PM call [phone] For any issues after 7:00PM please call eLink [phone]

## 2021-07-04 ENCOUNTER — Other Ambulatory Visit: Payer: Self-pay

## 2021-07-04 ENCOUNTER — Ambulatory Visit: Payer: Medicare Other | Admitting: Internal Medicine

## 2021-07-04 ENCOUNTER — Encounter: Payer: Self-pay | Admitting: Internal Medicine

## 2021-07-04 VITALS — BP 126/72 | HR 62 | Ht 67.0 in | Wt 207.4 lb

## 2021-07-04 DIAGNOSIS — E782 Mixed hyperlipidemia: Secondary | ICD-10-CM | POA: Diagnosis not present

## 2021-07-04 DIAGNOSIS — Z952 Presence of prosthetic heart valve: Secondary | ICD-10-CM | POA: Diagnosis not present

## 2021-07-04 DIAGNOSIS — I10 Essential (primary) hypertension: Secondary | ICD-10-CM

## 2021-07-04 NOTE — Progress Notes (Signed)
OFFICE NOTE  Chief Complaint:  Routine follow-up  Primary Care Physician: Wenda Low, MD  HPI:  Andrew Bonilla is a 87 year old gentleman with a history of severe aortic stenosis who underwent [facility] Ease pericardial tissue valve placement in the aortic position Jan 16, 2012. Postoperatively he has done very well, had some shortness of breath and persistent cough which has improved, as well as some anemia which has also improved with iron supplementation.  Recently he had a presyncopal episode while he was in the shower. He was particularly warm shower which may play a role in it. Also has been having problems with coughing, which is paroxysmal.  He's had no further episodes since the syncopal episode but was seen by one of our physician assistants and he recommended an echocardiogram and monitor. The echocardiogram demonstrated a stable gradient across the aortic valve prosthesis with an EF of 50-55%. His monitor did not reveal any significant arrhythmias, pauses or any other findings that would explain his presyncopal episode.  Andrew Bonilla returns today for follow-up. He has been doing fairly well. He was increased for the past 6 months or so and denies any chest pain or worsening shortness of breath. He's had no presyncopal episodes. His cough is gone away. He is echo as mentioned was in January 2015 and showed no significant new findings. Unfortunately he's gained about 15-20 pounds and is in need of exercise and weight loss.  Andrew Bonilla has done well over the past year. He's had no further presyncopal episodes. He does get some shortness of breath when walking up hills. Recently he had some left chest discomfort. He reports however that it was tender to palpation and worse after lifting some heavy furniture. He does not note any worsening heaviness in his chest with exertion or particularly walking up steps. Review of recent lab work shows excellent cholesterol  control.  11/30/2016  Andrew Bonilla returns today for follow-up. Overall he's done well. Recently he and his wife went on a cruise in the [place].  Unfortunately he became ill and was seen in the emergency department after some sort of respiratory virus. Subsequently his wife developed a similar illness and then had a flare of diverticulitis. He denies any worsening shortness of breath or chest pain. He had cough associated with this however it is totally resolved. He underwent echo last year which demonstrated stable aortic valve gradients and normal LV function. He does have a history of bilateral carotid artery disease last assessed in 2013. At that time he had some moderate left carotid artery stenosis and very mild right carotid artery stenosis. He has been on intensive statin therapy, but is overdue for repeat carotid Dopplers. On the bright side, his weight is down 11 pounds since I last saw him.  07/10/2017  Andrew Bonilla was seen today in follow-up.  He is struggling with problems with his left knee.  He is told that he had a meniscal tear but also has bone-on-bone and may need knee replacement.  Currently he is undergoing some injections, but could be facing surgery with Dr. Maureen Ralphs.  As part of a preoperative evaluation I advised a repeat echocardiogram to reassess his aortic valve.  That was performed on July 05, 2017, this demonstrated an LVEF 60-65%, mild LVH, grade 1 diastolic dysfunction and a stable gradients across the aortic valve with a mean gradient of 14 mmHg and no evidence of obstruction.  Symptomatically he is done well, denying any chest pain or  worsening shortness of breath.  His long-standing cough has finally resolved.  The other issue is that he is recently gained a significant amount of weight.  He needs to work on this and certainly worsening his knee problems.  That being said, he can do 4 metabolic equivalents of activity, including walking up a flight of stairs and recently was  walking up a number of hills in [place] without any difficulty.  07/04/2021  Andrew Bonilla is seen today in follow-up.  Overall he seems to be doing well.  Unfortunately they had not COVID-19 both he and his wife after returning from [place] this past summer.  He has had a lingering cough and shortness of breath after that.  He has a upcoming appointment with ENT and has been seen by pulmonary.  Overall seems to be finally improving.  He also unfortunately had a thoracic vertebral fracture.  He was apparently seen at [facility] for this.  He does have some degree of kyphosis.  He had an echocardiogram last year which showed a stable aortic valve gradient without any evidence of progressive bioprosthetic valve disease.  Unfortunately his cardiac Dr. Darcey Nora has retired.  PMHx:  Past Medical History:  Diagnosis Date   Arthritis    Atherosclerosis of abdominal aorta ([place]) 2015   Carotid artery disease ([place]) 2015   Constipation    Deaf, left    WEARS HEARING AID RIGHT EAR    GERD (gastroesophageal reflux disease)    Hard of hearing    hearing aid in R ear   Heart murmur    mod-severe aortic stenosis    Hyperlipemia    Hypertension    Osteoarthritis of knee 2019   Left knee replacement   Pneumonia    in college   Prostate enlargement    BPH   Recurrent upper respiratory infection (URI)    12/2011   S/P AVR (aortic valve replacement) 01/16/2012   [facility] Ease 106mm pericardial tissue valve    Past Surgical History:  Procedure Laterality Date   AORTIC VALVE REPLACEMENT  01/17/2012   Procedure: AORTIC VALVE REPLACEMENT (AVR);  Surgeon: Ivin Poot, MD;  Location: [facility];  Service: Open Heart Surgery;  Laterality: N/A;   CARDIAC CATHETERIZATION  12/17/2011   mild nonobstructive CAD - 20% LAD stenosis, 30% OM stenosis, 20% Cfx, 20% RCA lesion   JOINT REPLACEMENT     Left total knee Dr. Wynelle Link 06-09-18   LEFT HEART CATHETERIZATION WITH CORONARY ANGIOGRAM N/A 12/17/2011   Procedure: LEFT HEART  CATHETERIZATION WITH CORONARY ANGIOGRAM;  Surgeon: Pixie Casino, MD;  Location: [facility] CATH LAB;  Service: Cardiovascular;  Laterality: N/A;   TEE WITHOUT CARDIOVERSION  01/2012   EF 50-55%; calcified AV annulus, severely calcified AV leaflets, mild regurg (AVR)   TOTAL KNEE ARTHROPLASTY Left 06/09/2018   Procedure: LEFT TOTAL KNEE ARTHROPLASTY;  Surgeon: Gaynelle Arabian, MD;  Location: [facility];  Service: Orthopedics;  Laterality: Left;   TOTAL KNEE ARTHROPLASTY Right 06/22/2019   Procedure: TOTAL KNEE ARTHROPLASTY;  Surgeon: Gaynelle Arabian, MD;  Location: [facility];  Service: Orthopedics;  Laterality: Right;  5min    FAMHx:  Family History  Problem Relation Age of Onset   Coronary artery disease Father 23       MI/smoker   Colon cancer Brother    Ovarian cancer Sister    COPD Sister     SOCHx:   reports that he has never smoked. He has never used smokeless tobacco. He reports that he  does not currently use alcohol after a past usage of about 1.0 - 2.0 standard drink per week. He reports that he does not use drugs.  ALLERGIES:  No Known Allergies  ROS: Pertinent items noted in HPI and remainder of comprehensive ROS otherwise negative.  HOME MEDS: Current Outpatient Medications  Medication Sig Dispense Refill   amoxicillin (AMOXIL) 500 MG tablet amoxicillin 500 mg tablet  4 tablets 1 hour prior to dental procedure     aspirin EC 81 MG tablet Take 81 mg by mouth daily.     atorvastatin (LIPITOR) 40 MG tablet TAKE 1 TABLET BY MOUTH EVERY DAY 30 tablet 11   benzonatate (TESSALON) 100 MG capsule Take 1 capsule (100 mg total) by mouth every 8 (eight) hours. 21 capsule 0   esomeprazole (NEXIUM) 40 MG capsule Take 40 mg by mouth daily at 12 noon.      HYDROcodone bit-homatropine (HYCODAN) 5-1.5 MG/5ML syrup Take 5 mLs by mouth at bedtime as needed.     hydrocortisone cream 1 % Apply 1 application topically 2 (two) times daily as needed for itching.      irbesartan (AVAPRO) 75 MG tablet TAKE  1 TABLET BY MOUTH EVERY DAY 90 tablet 3   polyethylene glycol (MIRALAX / GLYCOLAX) packet Take 17 g by mouth 2 (two) times daily. 14 each 0   saw palmetto 80 MG capsule Take 80 mg by mouth 2 (two) times daily.     tadalafil (CIALIS) 10 MG tablet Take 10 mg by mouth daily as needed for erectile dysfunction.      No current facility-administered medications for this visit.    LABS/IMAGING: No results found for this or any previous visit (from the past 48 hour(s)). No results found.  VITALS: BP 126/72   Pulse 62   Ht 5\' 7"  (1.702 m)   Wt 207 lb 6.4 oz (94.1 kg)   SpO2 97%   BMI 32.48 kg/m   EXAM: General appearance: alert, no distress and moderately obese Neck: no carotid bruit, no JVD and thyroid not enlarged, symmetric, no tenderness/mass/nodules Lungs: clear to auscultation bilaterally Heart: regular rate and rhythm, S1, S2 normal, no murmur, click, rub or gallop Abdomen: soft, non-tender; bowel sounds normal; no masses,  no organomegaly Extremities: extremities normal, atraumatic, no cyanosis or edema Pulses: 2+ and symmetric Skin: Skin color, texture, turgor normal. No rashes or lesions Neurologic: Grossly normal Psych: Pleasant  EKG: Normal sinus rhythm at 62, minimal voltage criteria for LVH-personally reviewed  ASSESSMENT: Severe aortic stenosis status post [facility] Ease 21 mm tissue valve in 2013 Hypertension Dyslipidemia Obesity Mild to moderate bilateral carotid artery disease  PLAN: 1.   Andrew Bonilla continues to do well with respect to his aortic valve replacement.  The valve gradient seem fairly stable.  We will plan a repeat echo next spring prior to his follow-up appointment.  Blood pressure is well controlled.  His cholesterols been at goal.  He recently had COVID-19 is having some longer haul symptoms related to chronic cough associated with that.  Hopefully that seems to be resolving.  I have encouraged continued activity and physical exercise.  Plan  follow-up with me in about 6 months.  Pixie Casino, MD, [facility], [place] Director of the Advanced Lipid Disorders &  Cardiovascular Risk Reduction Clinic Diplomate of the American Board of Clinical Lipidology Attending Cardiologist  Direct Dial: [phone]  Fax: [phone]  Website:  [email or web address]    Nadean Corwin  Ritta Hammes 07/04/2021, 1:43 PM

## 2021-07-04 NOTE — Patient Instructions (Addendum)
Medication Instructions:  Your physician recommends that you continue on your current medications as directed. Please refer to the Current Medication list given to you today.  *If you need a refill on your cardiac medications before your next appointment, please call your pharmacy*   Lab Work: None today  If you have labs (blood work) drawn today and your tests are completely normal, you will receive your results only by: [place] (if you have MyChart) OR A paper copy in the mail If you have any lab test that is abnormal or we need to change your treatment, we will call you to review the results.   Testing/Procedures: Your physician has requested that you have an echocardiogram (IN MARCH JUST BEFORE NEXT APPOINTMENT) Echocardiography is a painless test that uses sound waves to create images of your heart. It provides your doctor with information about the size and shape of your heart and how well your heart's chambers and valves are working. This procedure takes approximately one hour. There are no restrictions for this procedure.    Follow-Up: At [facility], you and your health needs are our priority.  As part of our continuing mission to provide you with exceptional heart care, we have created designated Provider Care Teams.  These Care Teams include your primary Cardiologist (physician) and Advanced Practice Providers (APPs -  Physician Assistants and Nurse Practitioners) who all work together to provide you with the care you need, when you need it.  We recommend signing up for the patient portal called "MyChart".  Sign up information is provided on this After Visit Summary.  MyChart is used to connect with patients for Virtual Visits (Telemedicine).  Patients are able to view lab/test results, encounter notes, upcoming appointments, etc.  Non-urgent messages can be sent to your provider as well.   To learn more about what you can do with MyChart, go to [email or web address].     Your next appointment:  MARCH [address] [place]  AT THE [facility] OFFICE at 1:45 pm  The format for your next appointment:   In Person  Provider:   Dr.Hilty   Other Instructions None

## 2021-08-16 ENCOUNTER — Other Ambulatory Visit: Payer: Self-pay

## 2021-08-16 ENCOUNTER — Encounter: Payer: Self-pay | Admitting: Emergency Medicine

## 2021-08-16 ENCOUNTER — Ambulatory Visit (INDEPENDENT_AMBULATORY_CARE_PROVIDER_SITE_OTHER): Payer: Medicare Other | Admitting: Emergency Medicine

## 2021-08-16 ENCOUNTER — Ambulatory Visit: Payer: Medicare Other | Admitting: Emergency Medicine

## 2021-08-16 DIAGNOSIS — R053 Chronic cough: Secondary | ICD-10-CM

## 2021-08-16 LAB — PULMONARY FUNCTION TEST
DL/VA % pred: 116 %
DL/VA: 4.48 ml/min/mmHg/L
DLCO cor % pred: 67 %
DLCO cor: 14.99 ml/min/mmHg
DLCO unc % pred: 67 %
DLCO unc: 14.99 ml/min/mmHg
FEF 25-75 Post: 3.84 L/sec
FEF 25-75 Pre: 2.45 L/sec
FEF2575-%Change-Post: 57 %
FEF2575-%Pred-Post: 257 %
FEF2575-%Pred-Pre: 164 %
FEV1-%Change-Post: 11 %
FEV1-%Pred-Post: 98 %
FEV1-%Pred-Pre: 88 %
FEV1-Post: 2.34 L
FEV1-Pre: 2.1 L
FEV1FVC-%Change-Post: 5 %
FEV1FVC-%Pred-Pre: 121 %
FEV6-%Change-Post: 2 %
FEV6-%Pred-Post: 80 %
FEV6-%Pred-Pre: 77 %
FEV6-Post: 2.53 L
FEV6-Pre: 2.46 L
FEV6FVC-%Pred-Post: 108 %
FEV6FVC-%Pred-Pre: 108 %
FVC-%Change-Post: 5 %
FVC-%Pred-Post: 75 %
FVC-%Pred-Pre: 71 %
FVC-Post: 2.6 L
FVC-Pre: 2.46 L
Post FEV1/FVC ratio: 90 %
Post FEV6/FVC ratio: 100 %
Pre FEV1/FVC ratio: 85 %
Pre FEV6/FVC Ratio: 100 %
RV % pred: 79 %
RV: 2.13 L
TLC % pred: 77 %
TLC: 5.19 L

## 2021-08-16 NOTE — Progress Notes (Signed)
Full PFT performed today. °

## 2021-08-16 NOTE — Progress Notes (Signed)
Subjective:    Patient ID: Andrew Bonilla, male    DOB: 05-26-1933, 87 y.o.   MRN: [id number]  HPI 87 year old never smoker with a history of CAD, aortic stenosis/AVR, hypertension (on ARB), BPH, GERD. He is referred today for evaluation of chronic cough. He was well until early September, does not have a baseline cough.  He had just returned from a trip to [place].  At that time he was diagnosed with COVID-19.  He was treated with azithromycin for possible superimposed bronchitis.  No chest x-ray was done at the time.  His symptoms included URI, cold sx, congestion and drainage. These are improved, but still w some residual nasal drainage - clear. Coughing sometimes during day, white thick phlegm. Was coughing frequently into the night, has awakened him a few times.  Has received abx, cough syrup/codeine, prednisone. Was just started on Breo a few days ago. His Nexium was just increased to bid a few days ago as well.   Chest x-ray 06/21/2020 reviewed by me showed normal heart size, no evidence of CHF, some mild peribronchial cuffing, mild left perihilar opacity.  ROV 08/16/21 --follow-up visit for 87 year old gentleman who was seen in late October for chronic cough.  This started in September after he was diagnosed with COVID-19.  I stopped his Breo, continued pantoprazole (he thought it was Nexium), added loratadine.  Today he reports that his cough is much improved.   Pulmonary function testing performed today reviewed by me, show principally restricted spirometry but with some subtle evidence for possible superimposed obstruction based on a borderline positive bronchodilator response.  Lung volumes are restricted.  Diffusion capacity decreased and corrects to the normal range when adjusted for alveolar volume.    Review of Systems As per HPI  Past Medical History:  Diagnosis Date   Arthritis    Atherosclerosis of abdominal aorta ([place]) 2015   Carotid artery disease ([place]) 2015    Constipation    Deaf, left    WEARS HEARING AID RIGHT EAR    GERD (gastroesophageal reflux disease)    Hard of hearing    hearing aid in R ear   Heart murmur    mod-severe aortic stenosis    Hyperlipemia    Hypertension    Osteoarthritis of knee 2019   Left knee replacement   Pneumonia    in college   Prostate enlargement    BPH   Recurrent upper respiratory infection (URI)    12/2011   S/P AVR (aortic valve replacement) 01/16/2012   [facility] Ease 60mm pericardial tissue valve     Family History  Problem Relation Age of Onset   Coronary artery disease Father 72       MI/smoker   Colon cancer Brother    Ovarian cancer Sister    COPD Sister      Social History   Socioeconomic History   Marital status: Married    Spouse name: Not on file   Number of children: 4   Years of education: Not on file   Highest education level: Not on file  Occupational History   Occupation: Engineer, water  Tobacco Use   Smoking status: Never   Smokeless tobacco: Never  Vaping Use   Vaping Use: Never used  Substance and Sexual Activity   Alcohol use: Not Currently    Alcohol/week: 1.0 - 2.0 standard drink    Types: 1 - 2 Standard drinks or equivalent per week    Comment: SOCIAL    Drug  use: Never   Sexual activity: Yes  Other Topics Concern   Not on file  Social History Narrative   Retired Arts development officer   Social Determinants of Radio broadcast assistant Strain: Not on file  Food Insecurity: Not on file  Transportation Needs: Not on file  Physical Activity: Not on file  Stress: Not on file  Social Connections: Not on file  Intimate Partner Violence: Not on file     No Known Allergies   Outpatient Medications Prior to Visit  Medication Sig Dispense Refill   amoxicillin (AMOXIL) 500 MG tablet amoxicillin 500 mg tablet  4 tablets 1 hour prior to dental procedure     aspirin EC 81 MG tablet Take 81 mg by mouth daily.     atorvastatin (LIPITOR) 40 MG tablet  TAKE 1 TABLET BY MOUTH EVERY DAY 30 tablet 11   esomeprazole (NEXIUM) 40 MG capsule Take 40 mg by mouth daily at 12 noon.      hydrocortisone cream 1 % Apply 1 application topically 2 (two) times daily as needed for itching.      irbesartan (AVAPRO) 75 MG tablet TAKE 1 TABLET BY MOUTH EVERY DAY 90 tablet 3   polyethylene glycol (MIRALAX / GLYCOLAX) packet Take 17 g by mouth 2 (two) times daily. 14 each 0   saw palmetto 80 MG capsule Take 80 mg by mouth 2 (two) times daily.     tadalafil (CIALIS) 10 MG tablet Take 10 mg by mouth daily as needed for erectile dysfunction.      benzonatate (TESSALON) 100 MG capsule Take 1 capsule (100 mg total) by mouth every 8 (eight) hours. (Patient not taking: Reported on 08/16/2021) 21 capsule 0   HYDROcodone bit-homatropine (HYCODAN) 5-1.5 MG/5ML syrup Take 5 mLs by mouth at bedtime as needed. (Patient not taking: Reported on 08/16/2021)     No facility-administered medications prior to visit.        Objective:   Physical Exam Vitals:   08/16/21 1619  BP: 122/78  Pulse: 63  Temp: 98.1 F (36.7 C)  TempSrc: Oral  SpO2: 99%  Weight: 210 lb (95.3 kg)  Height: 5\' 8"  (1.727 m)    Gen: Pleasant, well-nourished elderly man, in no distress,  normal affect  ENT: No lesions,  mouth clear,  oropharynx clear, no postnasal drip  Neck: No JVD, no stridor  Lungs: No use of accessory muscles, no crackles or wheezing on normal respiration, no wheeze on forced expiration  Cardiovascular: RRR, heart sounds normal, no murmur or gallops, no peripheral edema  Musculoskeletal: No deformities, no cyanosis or clubbing  Neuro: alert, awake, non focal  Skin: Warm, no lesions or rash      Assessment & Plan:  Cough No clear evidence for obstruction on his pulmonary function testing, no clear evidence for BD in absence of shortness of breath or cough, wheeze.  He did have a borderline change after albuterol.  If cough or dyspnea recur then a trial of BD would be  reasonable.  For now we will try to treat his rhinitis, GERD.  He will stop taking riding on a schedule, go to as needed.  Continue pantoprazole (this is what he is actually taking, he thought originally that it was Nexium).  Please continue your pantoprazole once daily as you have been taking it. Okay to use loratadine 10 mg once daily when you need it for allergy symptoms, congestion. We will not start any inhaler medications at this time. Follow Dr. Lamonte Sakai  if needed for any problems with coughing or shortness of breath.   Baltazar Apo, MD, PhD 08/16/2021, 4:45 PM [facility] [phone] or if no answer before 7:00PM call [phone] For any issues after 7:00PM please call eLink [phone]

## 2021-08-16 NOTE — Patient Instructions (Signed)
Please continue your pantoprazole once daily as you have been taking it. Okay to use loratadine 10 mg once daily when you need it for allergy symptoms, congestion. We will not start any inhaler medications at this time. Follow Dr. Lamonte Sakai if needed for any problems with coughing or shortness of breath.

## 2021-08-16 NOTE — Patient Instructions (Signed)
Full PFT performed today. °

## 2021-08-16 NOTE — Assessment & Plan Note (Signed)
No clear evidence for obstruction on his pulmonary function testing, no clear evidence for BD in absence of shortness of breath or cough, wheeze.  He did have a borderline change after albuterol.  If cough or dyspnea recur then a trial of BD would be reasonable.  For now we will try to treat his rhinitis, GERD.  He will stop taking riding on a schedule, go to as needed.  Continue pantoprazole (this is what he is actually taking, he thought originally that it was Nexium).  Please continue your pantoprazole once daily as you have been taking it. Okay to use loratadine 10 mg once daily when you need it for allergy symptoms, congestion. We will not start any inhaler medications at this time. Follow Dr. Lamonte Sakai if needed for any problems with coughing or shortness of breath.

## 2021-10-31 ENCOUNTER — Other Ambulatory Visit: Payer: Self-pay

## 2021-10-31 MED ORDER — ATORVASTATIN CALCIUM 40 MG PO TABS: 40.0000 mg | ORAL_TABLET | Freq: Every day | ORAL | 2 refills | Status: DC

## 2021-10-31 NOTE — Progress Notes (Signed)
Received paper refill request from [facility] for Atorvastatin. Sent refill for Atorvastatin.

## 2021-11-14 ENCOUNTER — Emergency Department (HOSPITAL_COMMUNITY)
Admission: EM | Admit: 2021-11-14 | Discharge: 2021-11-15 | Disposition: A | Discharge status: Home / Self Care | Payer: Medicare Other | Attending: Emergency Medicine | Admitting: Emergency Medicine

## 2021-11-14 ENCOUNTER — Other Ambulatory Visit: Payer: Self-pay

## 2021-11-14 ENCOUNTER — Encounter (HOSPITAL_COMMUNITY): Payer: Self-pay

## 2021-11-14 ENCOUNTER — Emergency Department (HOSPITAL_COMMUNITY): Payer: Medicare Other

## 2021-11-14 DIAGNOSIS — I10 Essential (primary) hypertension: Secondary | ICD-10-CM | POA: Insufficient documentation

## 2021-11-14 DIAGNOSIS — R109 Unspecified abdominal pain: Secondary | ICD-10-CM | POA: Diagnosis not present

## 2021-11-14 DIAGNOSIS — S22080A Wedge compression fracture of T11-T12 vertebra, initial encounter for closed fracture: Secondary | ICD-10-CM | POA: Insufficient documentation

## 2021-11-14 DIAGNOSIS — S299XXA Unspecified injury of thorax, initial encounter: Secondary | ICD-10-CM | POA: Diagnosis present

## 2021-11-14 DIAGNOSIS — R0789 Other chest pain: Secondary | ICD-10-CM | POA: Diagnosis not present

## 2021-11-14 DIAGNOSIS — Z7982 Long term (current) use of aspirin: Secondary | ICD-10-CM | POA: Insufficient documentation

## 2021-11-14 DIAGNOSIS — Z79899 Other long term (current) drug therapy: Secondary | ICD-10-CM | POA: Insufficient documentation

## 2021-11-14 LAB — COMPREHENSIVE METABOLIC PANEL
ALT: 24 U/L (ref 0–44)
AST: 32 U/L (ref 15–41)
Albumin: 4.6 g/dL (ref 3.5–5.0)
Alkaline Phosphatase: 66 U/L (ref 38–126)
Anion gap: 7 (ref 5–15)
BUN: 19 mg/dL (ref 8–23)
CO2: 28 mmol/L (ref 22–32)
Calcium: 9.4 mg/dL (ref 8.9–10.3)
Chloride: 97 mmol/L — ABNORMAL LOW (ref 98–111)
Creatinine, Ser: 0.83 mg/dL (ref 0.61–1.24)
GFR, Estimated: >60 mL/min (ref >60)
Glucose, Bld: 97 mg/dL (ref 70–99)
Potassium: 4.6 mmol/L (ref 3.5–5.1)
Sodium: 132 mmol/L — ABNORMAL LOW (ref 135–145)
Total Bilirubin: 1.1 mg/dL (ref 0.3–1.2)
Total Protein: 8.3 g/dL — ABNORMAL HIGH (ref 6.5–8.1)

## 2021-11-14 LAB — CBC WITH DIFFERENTIAL/PLATELET
Abs Immature Granulocytes: 0.02 10*3/uL (ref 0.00–0.07)
Basophils Absolute: 0.1 10*3/uL (ref 0.0–0.1)
Basophils Relative: 1 %
Eosinophils Absolute: 0.4 10*3/uL (ref 0.0–0.5)
Eosinophils Relative: 4 %
HCT: 41 % (ref 39.0–52.0)
Hemoglobin: 14.1 g/dL (ref 13.0–17.0)
Immature Granulocytes: 0 %
Lymphocytes Relative: 39 %
Lymphs Abs: 4.1 10*3/uL — ABNORMAL HIGH (ref 0.7–4.0)
MCH: 31.2 pg (ref 26.0–34.0)
MCHC: 34.4 g/dL (ref 30.0–36.0)
MCV: 90.7 fL (ref 80.0–100.0)
Monocytes Absolute: 1.2 10*3/uL — ABNORMAL HIGH (ref 0.1–1.0)
Monocytes Relative: 11 %
Neutro Abs: 4.7 10*3/uL (ref 1.7–7.7)
Neutrophils Relative %: 45 %
Platelets: 271 10*3/uL (ref 150–400)
RBC: 4.52 MIL/uL (ref 4.22–5.81)
RDW: 12.9 % (ref 11.5–15.5)
WBC: 10.6 10*3/uL — ABNORMAL HIGH (ref 4.0–10.5)
nRBC: 0 % (ref 0.0–0.2)

## 2021-11-14 MED ORDER — IOHEXOL 300 MG/ML  SOLN
100.0000 mL | Freq: Once | INTRAMUSCULAR | Status: AC | PRN
  Administered 2021-11-14: 100 mL via INTRAVENOUS

## 2021-11-14 MED ORDER — TRAMADOL HCL 50 MG PO TABS: 50.0000 mg | ORAL_TABLET | Freq: Four times a day (QID) | ORAL | 0 refills | Status: AC | PRN

## 2021-11-14 MED ORDER — OXYCODONE-ACETAMINOPHEN 5-325 MG PO TABS
1.0000 | ORAL_TABLET | Freq: Once | ORAL | Status: AC
  Administered 2021-11-14: 1 via ORAL
  Filled 2021-11-14: qty 1
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
## 2021-11-14 NOTE — ED Notes (Signed)
Pt has wife visiting and given apple juice ?
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
## 2021-11-14 NOTE — Discharge Instructions (Addendum)
You can use over the counter medications for your pain. If you have breakthrough pain I have written a prescription for pain control  ? ?Prescription given for Tramadol. Take medication as directed and do not operate machinery, drive a car, or work while taking this medication as it can make you drowsy. This pain medication can also make you constipated so you should take a stool softener with it such as miralax. ? ?Please wear the back brace as directed  ? ?Please follow up with the neurosurgeon, Dr. Trenton Gammon, or you can call your neurosurgeon for follow up.  ? ?Return to the emergency department immediately if you experience any back pain associated with fevers, loss of control of your bowels/bladder, weakness/numbness to your legs, numbness to your groin area, inability to walk, or inability to urinate.  ? ?

## 2021-11-14 NOTE — ED Notes (Signed)
Call to Ortho tech for TLSO.  ?Tech has ordered it and will come apply. ?

## 2021-11-14 NOTE — ED Notes (Signed)
Pt c/o pain in left side, has been able to urinate without problems, denies any N/v. Will continue to observe ?

## 2021-11-15 NOTE — ED Provider Notes (Signed)
Patient was signed out to me awaiting back brace however patient eloped before back brace was transferred from [facility] to [facility]. ?  Tedd Sias, [place] ?11/15/21 0379 ? ?  ?Hayden Rasmussen, MD ?11/15/21 1914 ? ?

## 2021-11-22 ENCOUNTER — Other Ambulatory Visit: Payer: Self-pay

## 2021-11-22 ENCOUNTER — Ambulatory Visit (HOSPITAL_COMMUNITY): Payer: Medicare Other | Attending: Internal Medicine

## 2021-11-22 DIAGNOSIS — Z952 Presence of prosthetic heart valve: Secondary | ICD-10-CM | POA: Insufficient documentation

## 2021-11-22 LAB — ECHOCARDIOGRAM COMPLETE
AV Mean grad: 7 mmHg
AV Peak grad: 12.4 mmHg
Ao pk vel: 1.76 m/s
Area-P 1/2: 2.2 cm2
S' Lateral: 3.2 cm

## 2021-11-24 ENCOUNTER — Other Ambulatory Visit: Payer: Self-pay

## 2021-11-24 ENCOUNTER — Ambulatory Visit: Payer: Medicare Other | Admitting: Internal Medicine

## 2021-11-24 ENCOUNTER — Encounter: Payer: Self-pay | Admitting: Internal Medicine

## 2021-11-24 VITALS — BP 135/82 | HR 62 | Ht 66.0 in | Wt 223.2 lb

## 2021-11-24 DIAGNOSIS — I10 Essential (primary) hypertension: Secondary | ICD-10-CM

## 2021-11-24 DIAGNOSIS — E782 Mixed hyperlipidemia: Secondary | ICD-10-CM | POA: Diagnosis not present

## 2021-11-24 DIAGNOSIS — I6523 Occlusion and stenosis of bilateral carotid arteries: Secondary | ICD-10-CM | POA: Diagnosis not present

## 2021-11-24 DIAGNOSIS — Z952 Presence of prosthetic heart valve: Secondary | ICD-10-CM | POA: Diagnosis not present
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
## 2021-11-24 NOTE — Patient Instructions (Signed)
Medication Instructions:  ?Your physician recommends that you continue on your current medications as directed. Please refer to the Current Medication list given to you today. ? ?*If you need a refill on your cardiac medications before your next appointment, please call your pharmacy* ? ?Testing: ?Your physician has requested that you have a carotid duplex. This test is an ultrasound of the carotid arteries in your neck. It looks at blood flow through these arteries that supply the brain with blood. Allow one hour for this exam. There are no restrictions or special instructions. ? ? ?Follow-Up: ?At [facility], you and your health needs are our priority.  As part of our continuing mission to provide you with exceptional heart care, we have created designated Provider Care Teams.  These Care Teams include your primary Cardiologist (physician) and Advanced Practice Providers (APPs -  Physician Assistants and Nurse Practitioners) who all work together to provide you with the care you need, when you need it. ? ?We recommend signing up for the patient portal called "MyChart".  Sign up information is provided on this After Visit Summary.  MyChart is used to connect with patients for Virtual Visits (Telemedicine).  Patients are able to view lab/test results, encounter notes, upcoming appointments, etc.  Non-urgent messages can be sent to your provider as well.   ?To learn more about what you can do with MyChart, go to [email or web address].   ? ?Your next appointment:   ? ?12 months with Dr. Debara Pickett ?

## 2021-11-30 ENCOUNTER — Ambulatory Visit (HOSPITAL_COMMUNITY)
Admission: RE | Admit: 2021-11-30 | Discharge: 2021-11-30 | Disposition: A | Discharge status: Home / Self Care | Payer: Medicare Other | Source: Ambulatory Visit | Attending: Cardiology | Admitting: Cardiology

## 2021-11-30 DIAGNOSIS — I6523 Occlusion and stenosis of bilateral carotid arteries: Secondary | ICD-10-CM | POA: Insufficient documentation

## 2021-12-25 ENCOUNTER — Encounter: Payer: Self-pay | Admitting: Internal Medicine

## 2021-12-25 ENCOUNTER — Other Ambulatory Visit: Payer: Self-pay | Admitting: *Deleted

## 2021-12-25 DIAGNOSIS — I6523 Occlusion and stenosis of bilateral carotid arteries: Secondary | ICD-10-CM

## 2021-12-27 ENCOUNTER — Other Ambulatory Visit: Payer: Self-pay | Admitting: Internal Medicine

## 2021-12-27 ENCOUNTER — Ambulatory Visit
Admission: RE | Admit: 2021-12-27 | Discharge: 2021-12-27 | Disposition: A | Discharge status: Home / Self Care | Payer: Medicare Other | Source: Ambulatory Visit | Attending: Internal Medicine | Admitting: Internal Medicine

## 2021-12-27 DIAGNOSIS — R053 Chronic cough: Secondary | ICD-10-CM

## 2021-12-30 ENCOUNTER — Other Ambulatory Visit: Payer: Self-pay | Admitting: Internal Medicine

## 2022-02-06 ENCOUNTER — Other Ambulatory Visit: Payer: Self-pay | Admitting: Internal Medicine

## 2022-10-30 ENCOUNTER — Other Ambulatory Visit: Payer: Self-pay

## 2022-10-30 ENCOUNTER — Observation Stay (HOSPITAL_COMMUNITY)
Admission: EM | Admit: 2022-10-30 | Discharge: 2022-10-31 | Disposition: A | Discharge status: Home / Self Care | Payer: Medicare Other | Attending: Internal Medicine | Admitting: Internal Medicine

## 2022-10-30 ENCOUNTER — Encounter (HOSPITAL_COMMUNITY): Payer: Self-pay

## 2022-10-30 ENCOUNTER — Emergency Department (HOSPITAL_COMMUNITY): Payer: Medicare Other

## 2022-10-30 DIAGNOSIS — Z79899 Other long term (current) drug therapy: Secondary | ICD-10-CM | POA: Insufficient documentation

## 2022-10-30 DIAGNOSIS — Z7982 Long term (current) use of aspirin: Secondary | ICD-10-CM | POA: Diagnosis not present

## 2022-10-30 DIAGNOSIS — J101 Influenza due to other identified influenza virus with other respiratory manifestations: Secondary | ICD-10-CM

## 2022-10-30 DIAGNOSIS — R55 Syncope and collapse: Principal | ICD-10-CM

## 2022-10-30 DIAGNOSIS — R7989 Other specified abnormal findings of blood chemistry: Secondary | ICD-10-CM | POA: Diagnosis not present

## 2022-10-30 DIAGNOSIS — E875 Hyperkalemia: Secondary | ICD-10-CM

## 2022-10-30 DIAGNOSIS — I10 Essential (primary) hypertension: Secondary | ICD-10-CM | POA: Diagnosis not present

## 2022-10-30 DIAGNOSIS — Z20822 Contact with and (suspected) exposure to covid-19: Secondary | ICD-10-CM | POA: Diagnosis not present

## 2022-10-30 DIAGNOSIS — Z952 Presence of prosthetic heart valve: Secondary | ICD-10-CM

## 2022-10-30 DIAGNOSIS — E876 Hypokalemia: Secondary | ICD-10-CM | POA: Diagnosis present

## 2022-10-30 DIAGNOSIS — E782 Mixed hyperlipidemia: Secondary | ICD-10-CM | POA: Diagnosis present

## 2022-10-30 DIAGNOSIS — I2510 Atherosclerotic heart disease of native coronary artery without angina pectoris: Secondary | ICD-10-CM | POA: Diagnosis not present

## 2022-10-30 DIAGNOSIS — Z96653 Presence of artificial knee joint, bilateral: Secondary | ICD-10-CM | POA: Diagnosis not present

## 2022-10-30 LAB — CBC WITH DIFFERENTIAL/PLATELET
Abs Immature Granulocytes: 0.03 10*3/uL (ref 0.00–0.07)
Basophils Absolute: 0 10*3/uL (ref 0.0–0.1)
Basophils Relative: 1 %
Eosinophils Absolute: 0 10*3/uL (ref 0.0–0.5)
Eosinophils Relative: 1 %
HCT: 39.4 % (ref 39.0–52.0)
Hemoglobin: 13.7 g/dL (ref 13.0–17.0)
Immature Granulocytes: 0 %
Lymphocytes Relative: 16 %
Lymphs Abs: 1.3 10*3/uL (ref 0.7–4.0)
MCH: 31.2 pg (ref 26.0–34.0)
MCHC: 34.8 g/dL (ref 30.0–36.0)
MCV: 89.7 fL (ref 80.0–100.0)
Monocytes Absolute: 1.2 10*3/uL — ABNORMAL HIGH (ref 0.1–1.0)
Monocytes Relative: 15 %
Neutro Abs: 5.6 10*3/uL (ref 1.7–7.7)
Neutrophils Relative %: 67 %
Platelets: 223 10*3/uL (ref 150–400)
RBC: 4.39 MIL/uL (ref 4.22–5.81)
RDW: 13 % (ref 11.5–15.5)
WBC: 8.2 10*3/uL (ref 4.0–10.5)
nRBC: 0.2 % (ref 0.0–0.2)

## 2022-10-30 LAB — BASIC METABOLIC PANEL
Anion gap: 7 (ref 5–15)
BUN: 9 mg/dL (ref 8–23)
CO2: 19 mmol/L — ABNORMAL LOW (ref 22–32)
Calcium: 6.2 mg/dL — CL (ref 8.9–10.3)
Chloride: 110 mmol/L (ref 98–111)
Creatinine, Ser: 0.58 mg/dL — ABNORMAL LOW (ref 0.61–1.24)
GFR, Estimated: >60 mL/min (ref >60)
Glucose, Bld: 88 mg/dL (ref 70–99)
Potassium: 3.1 mmol/L — ABNORMAL LOW (ref 3.5–5.1)
Sodium: 136 mmol/L (ref 135–145)

## 2022-10-30 LAB — COMPREHENSIVE METABOLIC PANEL
ALT: 20 U/L (ref 0–44)
AST: 58 U/L — ABNORMAL HIGH (ref 15–41)
Albumin: 4 g/dL (ref 3.5–5.0)
Alkaline Phosphatase: 59 U/L (ref 38–126)
Anion gap: 14 (ref 5–15)
BUN: 11 mg/dL (ref 8–23)
CO2: 20 mmol/L — ABNORMAL LOW (ref 22–32)
Calcium: 8.4 mg/dL — ABNORMAL LOW (ref 8.9–10.3)
Chloride: 97 mmol/L — ABNORMAL LOW (ref 98–111)
Creatinine, Ser: 0.86 mg/dL (ref 0.61–1.24)
GFR, Estimated: >60 mL/min (ref >60)
Glucose, Bld: 97 mg/dL (ref 70–99)
Potassium: 6.1 mmol/L — ABNORMAL HIGH (ref 3.5–5.1)
Sodium: 131 mmol/L — ABNORMAL LOW (ref 135–145)
Total Bilirubin: 1.8 mg/dL — ABNORMAL HIGH (ref 0.3–1.2)
Total Protein: 6.7 g/dL (ref 6.5–8.1)

## 2022-10-30 LAB — URINALYSIS, ROUTINE W REFLEX MICROSCOPIC
Bilirubin Urine: NEGATIVE
Glucose, UA: NEGATIVE mg/dL
Hgb urine dipstick: NEGATIVE
Ketones, ur: 15 mg/dL — AB
Leukocytes,Ua: NEGATIVE
Nitrite: NEGATIVE
Protein, ur: NEGATIVE mg/dL
Specific Gravity, Urine: 1.01 (ref 1.005–1.030)
pH: 7.5 (ref 5.0–8.0)

## 2022-10-30 LAB — TROPONIN I (HIGH SENSITIVITY)
Troponin I (High Sensitivity): 19 ng/L — ABNORMAL HIGH (ref <18)
Troponin I (High Sensitivity): 27 ng/L — ABNORMAL HIGH (ref <18)

## 2022-10-30 LAB — RESP PANEL BY RT-PCR (RSV, FLU A&B, COVID)  RVPGX2
Influenza A by PCR: POSITIVE — AB
Influenza B by PCR: NEGATIVE
Resp Syncytial Virus by PCR: NEGATIVE
SARS Coronavirus 2 by RT PCR: NEGATIVE

## 2022-10-30 LAB — MAGNESIUM: Magnesium: 2.1 mg/dL (ref 1.7–2.4)

## 2022-10-30 LAB — CBG MONITORING, ED: Glucose-Capillary: 101 mg/dL — ABNORMAL HIGH (ref 70–99)

## 2022-10-30 MED ORDER — SODIUM ZIRCONIUM CYCLOSILICATE 10 G PO PACK
10.0000 g | PACK | Freq: Once | ORAL | Status: AC
  Administered 2022-10-30: 10 g via ORAL
  Filled 2022-10-30: qty 1

## 2022-10-30 MED ORDER — OSELTAMIVIR PHOSPHATE 75 MG PO CAPS
75.0000 mg | ORAL_CAPSULE | Freq: Two times a day (BID) | ORAL | Status: DC
  Administered 2022-10-30 – 2022-10-31 (×2): 75 mg via ORAL
  Filled 2022-10-30 (×2): qty 1

## 2022-10-30 MED ORDER — CALCIUM CARBONATE ANTACID 500 MG PO CHEW
2.0000 | CHEWABLE_TABLET | Freq: Two times a day (BID) | ORAL | Status: DC
  Administered 2022-10-30: 400 mg via ORAL
  Filled 2022-10-30: qty 2

## 2022-10-30 MED ORDER — POTASSIUM CHLORIDE 10 MEQ/100ML IV SOLN: 10.0000 meq | INTRAVENOUS | Status: DC

## 2022-10-30 MED ORDER — SODIUM CHLORIDE 0.9 % IV BOLUS (SEPSIS)
1000.0000 mL | Freq: Once | INTRAVENOUS | Status: AC
  Administered 2022-10-30: 1000 mL via INTRAVENOUS

## 2022-10-30 MED ORDER — SODIUM CHLORIDE 0.9 % IV SOLN
4.0000 g | Freq: Once | INTRAVENOUS | Status: AC
  Administered 2022-10-30: 4 g via INTRAVENOUS
  Filled 2022-10-30: qty 40

## 2022-10-30 MED ORDER — CALCIUM GLUCONATE-NACL 2-0.675 GM/100ML-% IV SOLN
2.0000 g | Freq: Once | INTRAVENOUS | Status: DC
  Filled 2022-10-30: qty 100

## 2022-10-30 NOTE — H&P (Incomplete)
PCP:   Wenda Low, MD   Chief Complaint:  Syncope and collapse  HPI: This is a 89 year old male with past medical history of HTN, HLD, carotid artery disease, BPH, and h/o AVR 2013.  Patient did not eat since breakfast.  He and his wife are preparing dinner, he put the chicken on to fry.  He states the smell hit him and made him nauseous, he felt as though he was about to [facility].  The next thing he knew he was on the kitchen floor and his wife was standing over him.  Per patient, he and his wife came home from [place] on Friday.  On Saturday he started hacking, coughing and sneezing.  He is felt weak and has been in bed most of the week.  He states his PO intake has been good.  He denies shortness of breath, chest pain, or lightheadedness.  After passing out he was brought to the ER.  In the ER he was diagnosed with influenza.  He is not hypoxic.  Per patient he states he did not syncopized, he just  fell.  Patient potasium 6.2.  Lokelma ordered by EDP.    Review of Systems:  The patient denies anorexia, fever, weight loss,, vision loss, decreased hearing, hoarseness, chest pain, syncope, dyspnea on exertion, peripheral edema, balance deficits, hemoptysis, abdominal pain, melena, hematochezia, severe indigestion/heartburn, hematuria, incontinence, genital sores, muscle weakness, suspicious skin lesions, transient blindness, difficulty walking, depression, unusual weight change, abnormal bleeding, enlarged lymph nodes, angioedema, and breast masses. Positives: Syncope and collapse, nausea, weakness, lethargy  Past Medical History: Past Medical History:  Diagnosis Date   Arthritis    Atherosclerosis of abdominal aorta ([place]) 2015   Carotid artery disease ([place]) 2015   Constipation    Deaf, left    WEARS HEARING AID RIGHT EAR    GERD (gastroesophageal reflux disease)    Hard of hearing    hearing aid in R ear   Heart murmur    mod-severe aortic stenosis    Hyperlipemia    Hypertension     Osteoarthritis of knee 2019   Left knee replacement   Pneumonia    in college   Prostate enlargement    BPH   Recurrent upper respiratory infection (URI)    12/2011   S/P AVR (aortic valve replacement) 01/16/2012   [facility] Ease 75m pericardial tissue valve   Past Surgical History:  Procedure Laterality Date   AORTIC VALVE REPLACEMENT  01/17/2012   Procedure: AORTIC VALVE REPLACEMENT (AVR);  Surgeon: PIvin Poot MD;  [facility]  Service: Open Heart Surgery;  Laterality: N/A;   CARDIAC CATHETERIZATION  12/17/2011   mild nonobstructive CAD - 20% LAD stenosis, 30% OM stenosis, 20% Cfx, 20% RCA lesion   JOINT REPLACEMENT     Left total knee Dr. AWynelle Link10-7-19   LEFT HEART CATHETERIZATION WITH CORONARY ANGIOGRAM N/A 12/17/2011   Procedure: LEFT HEART CATHETERIZATION WITH CORONARY ANGIOGRAM;  Surgeon: KPixie Casino MD;  [facility];  Service: Cardiovascular;  Laterality: N/A;   TEE WITHOUT CARDIOVERSION  01/2012   EF 50-55%; calcified AV annulus, severely calcified AV leaflets, mild regurg (AVR)   TOTAL KNEE ARTHROPLASTY Left 06/09/2018   Procedure: LEFT TOTAL KNEE ARTHROPLASTY;  Surgeon: AGaynelle Arabian MD;  Location: [facility];  Service: Orthopedics;  Laterality: Left;   TOTAL KNEE ARTHROPLASTY Right 06/22/2019   Procedure: TOTAL KNEE ARTHROPLASTY;  Surgeon: AGaynelle Arabian MD;  Location: [facility];  Service: Orthopedics;  Laterality: Right;  32mn    Medications: Prior to Admission medications   Medication Sig Start Date End Date Taking? Authorizing Provider  amoxicillin (AMOXIL) 500 MG tablet amoxicillin 500 mg tablet  4 tablets 1 hour prior to dental procedure    [provider]  aspirin EC 81 MG tablet Take 81 mg by mouth daily.    [provider]  atorvastatin (LIPITOR) 40 MG tablet TAKE 1 TABLET BY MOUTH EVERY DAY 02/06/22   Hilty, KNadean Corwin MD  benzonatate (TESSALON) 100 MG capsule Take 1 capsule (100 mg total) by mouth every 8 (eight)  hours. 05/08/21   Ward, JLenise Arena PA-C  esomeprazole (NEXIUM) 40 MG capsule Take 40 mg by mouth daily at 12 noon.     [provider]  HYDROcodone bit-homatropine (HYCODAN) 5-1.5 MG/5ML syrup Take 5 mLs by mouth at bedtime as needed. 06/26/21   [provider]  hydrocortisone cream 1 % Apply 1 application topically 2 (two) times daily as needed for itching.     [provider]  irbesartan (AVAPRO) 75 MG tablet TAKE 1 TABLET BY MOUTH EVERY DAY 01/01/22   Hilty, KNadean Corwin MD  polyethylene glycol (MIRALAX / GLYCOLAX) packet Take 17 g by mouth 2 (two) times daily. 06/15/18   SRaiford NobleLatif, DO  saw palmetto 80 MG capsule Take 80 mg by mouth 2 (two) times daily.    [provider]  tadalafil (CIALIS) 10 MG tablet Take 10 mg by mouth daily as needed for erectile dysfunction.     [provider]  traMADol (ULTRAM) 50 MG tablet Take 1 tablet (50 mg total) by mouth every 6 (six) hours as needed. 11/14/21   Couture, Cortni S, PA-C    Allergies:  No Known Allergies  Social History:  reports that he has never smoked. He has never used smokeless tobacco. He reports that he does not currently use alcohol after a past usage of about 1.0 - 2.0 standard drink of alcohol per week. He reports that he does not use drugs.  Family History: Family History  Problem Relation Age of Onset   Coronary artery disease Father 652      MI/smoker   Colon cancer Brother    Ovarian cancer Sister    COPD Sister     Physical Exam: Vitals:   10/30/22 1826 10/30/22 1855 10/30/22 1900 10/30/22 2115  BP: (!) 148/66 (!) 151/67 (!) 157/66 (!) 151/71  Pulse: 82 78 82 85  Resp: (!) 22 18 (!) 22 17  Temp: 98.7 F (37.1 C)     TempSrc: Oral     SpO2: 99% 99% 100% 99%  Weight:      Height:        General:  Alert and oriented times three, well developed and nourished, weak, frail appearing gentleman Eyes: PERRLA, pink conjunctiva, no scleral icterus ENT: Moist oral mucosa,  neck supple, no thyromegaly Lungs: clear to ascultation, no wheeze, no crackles, no use of accessory muscles Cardiovascular: regular rate and rhythm, no regurgitation, no gallops, no murmurs. No carotid bruits, no JVD Abdomen: soft, positive BS, non-tender, non-distended, no organomegaly, not an acute abdomen GU: not examined Neuro: CN II - XII grossly intact, sensation intact Musculoskeletal: strength 5/5 all extremities, no clubbing, cyanosis or edema Skin: no rash, no subcutaneous crepitation, no decubitus Psych: appropriate patient   Labs on Admission:  Recent Labs    10/30/22 1845 10/30/22 2107  NA 131* 136  K 6.1* 3.1*  CL 97* 110  CO2  20* 19*  GLUCOSE 97 88  BUN 11 9  CREATININE 0.86 0.58*  CALCIUM 8.4* 6.2*   Recent Labs    10/30/22 1845  AST 58*  ALT 20  ALKPHOS 59  BILITOT 1.8*  PROT 6.7  ALBUMIN 4.0    Recent Labs    10/30/22 1854  WBC 8.2  NEUTROABS 5.6  HGB 13.7  HCT 39.4  MCV 89.7  PLT 223    Micro Results: Recent Results (from the past 240 hour(s))  Resp panel by RT-PCR (RSV, Flu A&B, Covid) Anterior Nasal Swab     Status: Abnormal   Collection Time: 10/30/22  6:33 PM   Specimen: Anterior Nasal Swab  Result Value Ref Range Status   SARS Coronavirus 2 by RT PCR NEGATIVE NEGATIVE Final   Influenza A by PCR POSITIVE (A) NEGATIVE Final   Influenza B by PCR NEGATIVE NEGATIVE Final    Comment: (NOTE) The Xpert Xpress SARS-CoV-2/FLU/RSV plus assay is intended as an aid in the diagnosis of influenza from Nasopharyngeal swab specimens and should not be used as a sole basis for treatment. Nasal washings and aspirates are unacceptable for Xpert Xpress SARS-CoV-2/FLU/RSV testing.  Fact Sheet for Patients: EntrepreneurPulse.com.au  Fact Sheet for Healthcare Providers: IncredibleEmployment.be  This test is not yet approved or cleared by the [place] FDA and has been authorized for detection and/or diagnosis  of SARS-CoV-2 by FDA under an Emergency Use Authorization (EUA). This EUA will remain in effect (meaning this test can be used) for the duration of the COVID-19 declaration under Section 564(b)(1) of the Act, 21 U.S.C. section 360bbb-3(b)(1), unless the authorization is terminated or revoked.     Resp Syncytial Virus by PCR NEGATIVE NEGATIVE Final    Comment: (NOTE) Fact Sheet for Patients: EntrepreneurPulse.com.au  Fact Sheet for Healthcare Providers: IncredibleEmployment.be  This test is not yet approved or cleared by the [place] FDA and has been authorized for detection and/or diagnosis of SARS-CoV-2 by FDA under an Emergency Use Authorization (EUA). This EUA will remain in effect (meaning this test can be used) for the duration of the COVID-19 declaration under Section 564(b)(1) of the Act, 21 U.S.C. section 360bbb-3(b)(1), unless the authorization is terminated or revoked.  Performed at [facility], [place] [address], [place], [place] [address]      Radiological Exams on Admission: CT Cervical Spine Wo Contrast  Result Date: 10/30/2022 CLINICAL DATA:  Syncope, head trauma EXAM: CT CERVICAL SPINE WITHOUT CONTRAST TECHNIQUE: Multidetector CT imaging of the cervical spine was performed without intravenous contrast. Multiplanar CT image reconstructions were also generated. RADIATION DOSE REDUCTION: This exam was performed according to the departmental dose-optimization program which includes automated exposure control, adjustment of the mA and/or kV according to patient size and/or use of iterative reconstruction technique. COMPARISON:  None Available. FINDINGS: Alignment: Alignment is grossly anatomic. Skull base and vertebrae: No acute fracture. No primary bone lesion or focal pathologic process. Soft tissues and spinal canal: No prevertebral fluid or swelling. No visible canal hematoma. Disc levels: There is diffuse facet hypertrophy.  Prominent spondylosis at C6-7. No significant bony encroachment on the central canal. Mild right predominant neural foraminal narrowing at C3-4 and C4-5 due to facet hypertrophy. Upper chest: Airway is widely patent.  Lung apices are clear. Other: Reconstructed images demonstrate no additional findings. IMPRESSION: 1. No acute cervical spine fracture. 2. Multilevel spondylosis and facet hypertrophy as above. Electronically Signed   By: Randa Ngo M.D.   On: 10/30/2022 21:26   CT  Head Wo Contrast  Result Date: 10/30/2022 CLINICAL DATA:  Syncope and head trauma EXAM: CT HEAD WITHOUT CONTRAST TECHNIQUE: Contiguous axial images were obtained from the base of the skull through the vertex without intravenous contrast. RADIATION DOSE REDUCTION: This exam was performed according to the departmental dose-optimization program which includes automated exposure control, adjustment of the mA and/or kV according to patient size and/or use of iterative reconstruction technique. COMPARISON:  Head CT 06/25/2019.  MRI head 06/15/2018. FINDINGS: Brain: No evidence of acute infarction, hemorrhage, hydrocephalus, extra-axial collection or mass lesion/mass effect. Again seen is mild diffuse atrophy. Vascular: Atherosclerotic calcifications are present within the cavernous internal carotid arteries. Skull: Normal. Negative for fracture or focal lesion. Sinuses/Orbits: There is diffuse mucosal thickening of ethmoid air cells and paranasal sinuses. Orbits are within normal limits. Other: None. IMPRESSION: 1. No acute intracranial abnormality. 2. Mild diffuse atrophy. 3. Paranasal sinus disease. Electronically Signed   By: Ronney Asters M.D.   On: 10/30/2022 21:23   DG Chest 2 View  Result Date: 10/30/2022 CLINICAL DATA:  Cough.  Congestion EXAM: CHEST - 2 VIEW COMPARISON:  Chest x-ray 12/27/2021 and older.  CT 11/14/2021 FINDINGS: Underinflation. Sternal wires. Prosthetic aortic valve. There is some linear opacity lung bases  likely scar or atelectasis. No pneumothorax or effusion. Bronchovascular crowding. Calcified aorta. Borderline cardiopericardial silhouette. Multiple loops of air-filled bowel are seen beneath the diaphragm including along the right side. Degenerative changes of the spine. There is severe compression deformity of the lower thoracic spine vertebral level, unchanged from previous. There is some associated kyphosis. IMPRESSION: Poor inflation with basilar atelectasis and bronchovascular crowding. Postop chest. Electronically Signed   By: Jill Side M.D.   On: 10/30/2022 19:24    Assessment/Plan Present on Admission:  Influenza A/syncope and collapse -Tamiflu ordered -Tessalon Perles, nebulizers as needed -PT consult   Hyperkalemia -Treated with with lokelma x 1 dose in ER.Marland Kitchen  Potassium 3.1   Influenza A -Tamiflu started   Essential hypertension -Orthostatic vitals.  IV fluids -Repeat orthostatic vitals in a.m. -Blood pressure medications again with hold parameters   Mixed hyperlipidemia -Lipitor resumed  Hypocalcemia -Calcium 8.4 on earlier BMP. No symptoms of hypocalcemia.  -IV had been calcium given. Patient being hydrated. Repeat BMP ordered   Garland Smouse 10/30/2022, 10:11 PM

## 2022-10-30 NOTE — ED Notes (Signed)
Patient transported to CT 

## 2022-10-30 NOTE — ED Triage Notes (Signed)
Patient bib [facility] from home after a syncopal episode. Patient states that his wife was cooking and the smell made him nauseous and when he stood up he passed out. When he passed out he has an incontinence episode also. Patient did hit  the front of his head. When initial EMTs arrived on scene patient was cool and clammy with a HR of 56. Patient is not on blood thinners or beta blockers. [facility] VSS and CBG of 120.

## 2022-10-30 NOTE — ED Notes (Signed)
IV team at bedside 

## 2022-10-30 NOTE — ED Notes (Signed)
Patient returned to room from CT. 

## 2022-10-30 NOTE — ED Notes (Signed)
EDP notified of critical calcium level 6.2

## 2022-10-30 NOTE — ED Provider Notes (Signed)
[facility] Provider Note   CSN: [id number] Arrival date & time: 10/30/22  1752     History  Chief Complaint  Patient presents with   Loss of Consciousness    Andrew Bonilla is a 89 y.o. male.  With PMH of carotid artery disease, MGUS, severe AS s/p of aortic valve replacement 2013 who presents after syncopal episode.  Was standing and cooking in the kitchen when a strong aroma hit his nose felt faint and collapsed to the ground.  His wife tried to assist him but he still hit the front of his head.  He was only out for a brief period of time he did have some urinary incontinence but had no seizure-like activity or shaking.  He woke up and was acting appropriately.  Was not postictal.  He denied any chest pain, shortness of breath or palpitations.  He has been having intermittent cough congestion rhinorrhea from recent trip from [place].  They did spend time in the car for prolonged period of time getting back from [place].  However has no leg pain or swelling.  Has history of aortic valve replacement in 2013.  Has intermittent spells of shortness of breath.  Has had no associated slurred speech, visual changes, focal weakness or other strokelike symptoms.   Loss of Consciousness      Home Medications Prior to Admission medications   Medication Sig Start Date End Date Taking? Authorizing Provider  amoxicillin (AMOXIL) 500 MG tablet amoxicillin 500 mg tablet  4 tablets 1 hour prior to dental procedure    [provider]  aspirin EC 81 MG tablet Take 81 mg by mouth daily.    [provider]  atorvastatin (LIPITOR) 40 MG tablet TAKE 1 TABLET BY MOUTH EVERY DAY 02/06/22   Hilty, Nadean Corwin, MD  benzonatate (TESSALON) 100 MG capsule Take 1 capsule (100 mg total) by mouth every 8 (eight) hours. 05/08/21   Ward, Lenise Arena, PA-C  esomeprazole (NEXIUM) 40 MG capsule Take 40 mg by mouth daily at 12 noon.     [provider]   HYDROcodone bit-homatropine (HYCODAN) 5-1.5 MG/5ML syrup Take 5 mLs by mouth at bedtime as needed. 06/26/21   [provider]  hydrocortisone cream 1 % Apply 1 application topically 2 (two) times daily as needed for itching.     [provider]  irbesartan (AVAPRO) 75 MG tablet TAKE 1 TABLET BY MOUTH EVERY DAY 01/01/22   Hilty, Nadean Corwin, MD  polyethylene glycol (MIRALAX / GLYCOLAX) packet Take 17 g by mouth 2 (two) times daily. 06/15/18   Raiford Noble Latif, DO  saw palmetto 80 MG capsule Take 80 mg by mouth 2 (two) times daily.    [provider]  tadalafil (CIALIS) 10 MG tablet Take 10 mg by mouth daily as needed for erectile dysfunction.     [provider]  traMADol (ULTRAM) 50 MG tablet Take 1 tablet (50 mg total) by mouth every 6 (six) hours as needed. 11/14/21   Couture, Cortni S, PA-C      Allergies    Patient has no known allergies.    Review of Systems   Review of Systems  Cardiovascular:  Positive for syncope.    Physical Exam Updated Vital Signs BP (!) 151/71   Pulse 85   Temp 98.7 F (37.1 C) (Oral)   Resp 17   Ht '5\' 6"'$  (1.676 m)   Wt 97.6 kg   SpO2 99%  BMI 34.73 kg/m  Physical Exam Constitutional: Alert and oriented.  Pleasant male no acute distress interactive acting appropriately Eyes: Conjunctivae are normal. ENT      Head: Normocephalic and small erythematous abrasion of the mid forehead      Nose: No congestion.      Mouth/Throat: Mucous membranes are moist.      Neck: No stridor. Cardiovascular: S1, S2, regular rate and rhythm, equal palpable radial and PT pulses.  Warm and well-perfused.  Systolic ejection murmur.  Previous surgical thoracotomy scar well-healed. Respiratory: Mildly tachypneic, breath sounds clear, O2 sat 99 on RA Gastrointestinal: Soft and nondistended Musculoskeletal: Normal range of motion in all extremities. No pitting edema or tenderness or erythema bilateral lower extremities Neurologic: Normal  speech and language.  CN II through XII grossly intact.  Normal finger-to-nose bilaterally.  Sensation grossly intact.  5 out of 5 strength bilateral upper and lower extremities.  No gross focal neurologic deficits are appreciated. Skin: Skin is warm, dry and intact. No rash noted. Psychiatric: Mood and affect are normal. Speech and behavior are normal.  ED Results / Procedures / Treatments   Labs (all labs ordered are listed, but only abnormal results are displayed) Labs Reviewed  RESP PANEL BY RT-PCR (RSV, FLU A&B, COVID)  RVPGX2 - Abnormal; Notable for the following components:      Result Value   Influenza A by PCR POSITIVE (*)    All other components within normal limits  COMPREHENSIVE METABOLIC PANEL - Abnormal; Notable for the following components:   Sodium 131 (*)    Potassium 6.1 (*)    Chloride 97 (*)    CO2 20 (*)    Calcium 8.4 (*)    AST 58 (*)    Total Bilirubin 1.8 (*)    All other components within normal limits  URINALYSIS, ROUTINE W REFLEX MICROSCOPIC - Abnormal; Notable for the following components:   Ketones, ur 15 (*)    All other components within normal limits  CBC WITH DIFFERENTIAL/PLATELET - Abnormal; Notable for the following components:   Monocytes Absolute 1.2 (*)    All other components within normal limits  BASIC METABOLIC PANEL - Abnormal; Notable for the following components:   Potassium 3.1 (*)    CO2 19 (*)    Creatinine, Ser 0.58 (*)    Calcium 6.2 (*)    All other components within normal limits  CBG MONITORING, ED - Abnormal; Notable for the following components:   Glucose-Capillary 101 (*)    All other components within normal limits  TROPONIN I (HIGH SENSITIVITY) - Abnormal; Notable for the following components:   Troponin I (High Sensitivity) 19 (*)    All other components within normal limits  TROPONIN I (HIGH SENSITIVITY) - Abnormal; Notable for the following components:   Troponin I (High Sensitivity) 27 (*)    All other components  within normal limits  CBC WITH DIFFERENTIAL/PLATELET  CALCIUM, IONIZED  MAGNESIUM  BASIC METABOLIC PANEL  CBC    EKG EKG Interpretation  Date/Time:  Tuesday October 30 2022 18:25:41 EST Ventricular Rate:  80 PR Interval:  162 QRS Duration: 113 QT Interval:  393 QTC Calculation: 454 R Axis:   -25 Text Interpretation: Sinus rhythm Borderline intraventricular conduction delay No significant change since last tracing Confirmed by Georgina Snell [phone]) on 10/30/2022 6:31:46 PM  Radiology CT Cervical Spine Wo Contrast  Result Date: 10/30/2022 CLINICAL DATA:  Syncope, head trauma EXAM: CT CERVICAL SPINE WITHOUT CONTRAST TECHNIQUE: Multidetector CT imaging of the  cervical spine was performed without intravenous contrast. Multiplanar CT image reconstructions were also generated. RADIATION DOSE REDUCTION: This exam was performed according to the departmental dose-optimization program which includes automated exposure control, adjustment of the mA and/or kV according to patient size and/or use of iterative reconstruction technique. COMPARISON:  None Available. FINDINGS: Alignment: Alignment is grossly anatomic. Skull base and vertebrae: No acute fracture. No primary bone lesion or focal pathologic process. Soft tissues and spinal canal: No prevertebral fluid or swelling. No visible canal hematoma. Disc levels: There is diffuse facet hypertrophy. Prominent spondylosis at C6-7. No significant bony encroachment on the central canal. Mild right predominant neural foraminal narrowing at C3-4 and C4-5 due to facet hypertrophy. Upper chest: Airway is widely patent.  Lung apices are clear. Other: Reconstructed images demonstrate no additional findings. IMPRESSION: 1. No acute cervical spine fracture. 2. Multilevel spondylosis and facet hypertrophy as above. Electronically Signed   By: Randa Ngo M.D.   On: 10/30/2022 21:26   CT Head Wo Contrast  Result Date: 10/30/2022 CLINICAL DATA:  Syncope and  head trauma EXAM: CT HEAD WITHOUT CONTRAST TECHNIQUE: Contiguous axial images were obtained from the base of the skull through the vertex without intravenous contrast. RADIATION DOSE REDUCTION: This exam was performed according to the departmental dose-optimization program which includes automated exposure control, adjustment of the mA and/or kV according to patient size and/or use of iterative reconstruction technique. COMPARISON:  Head CT 06/25/2019.  MRI head 06/15/2018. FINDINGS: Brain: No evidence of acute infarction, hemorrhage, hydrocephalus, extra-axial collection or mass lesion/mass effect. Again seen is mild diffuse atrophy. Vascular: Atherosclerotic calcifications are present within the cavernous internal carotid arteries. Skull: Normal. Negative for fracture or focal lesion. Sinuses/Orbits: There is diffuse mucosal thickening of ethmoid air cells and paranasal sinuses. Orbits are within normal limits. Other: None. IMPRESSION: 1. No acute intracranial abnormality. 2. Mild diffuse atrophy. 3. Paranasal sinus disease. Electronically Signed   By: Ronney Asters M.D.   On: 10/30/2022 21:23   DG Chest 2 View  Result Date: 10/30/2022 CLINICAL DATA:  Cough.  Congestion EXAM: CHEST - 2 VIEW COMPARISON:  Chest x-ray 12/27/2021 and older.  CT 11/14/2021 FINDINGS: Underinflation. Sternal wires. Prosthetic aortic valve. There is some linear opacity lung bases likely scar or atelectasis. No pneumothorax or effusion. Bronchovascular crowding. Calcified aorta. Borderline cardiopericardial silhouette. Multiple loops of air-filled bowel are seen beneath the diaphragm including along the right side. Degenerative changes of the spine. There is severe compression deformity of the lower thoracic spine vertebral level, unchanged from previous. There is some associated kyphosis. IMPRESSION: Poor inflation with basilar atelectasis and bronchovascular crowding. Postop chest. Electronically Signed   By: Jill Side M.D.   On:  10/30/2022 19:24    Procedures .Critical Care  Performed by: Elgie Congo, MD Authorized by: Elgie Congo, MD   Critical care provider statement:    Critical care time (minutes):  45   Critical care was necessary to treat or prevent imminent or life-threatening deterioration of the following conditions:  Metabolic crisis   Critical care was time spent personally by me on the following activities:  Development of treatment plan with patient or surrogate, discussions with consultants, evaluation of patient's response to treatment, examination of patient, ordering and review of laboratory studies, ordering and review of radiographic studies, ordering and performing treatments and interventions, pulse oximetry, re-evaluation of patient's condition, review of old charts and obtaining history from patient or surrogate   Care discussed with: admitting provider  Remain on constant cardiac monitoring, sinus rhythm with normal rates.  Medications Ordered in ED Medications  calcium gluconate 4 g in sodium chloride 0.9 % 100 mL IVPB (has no administration in time range)  calcium carbonate (TUMS - dosed in mg elemental calcium) chewable tablet 400 mg of elemental calcium (400 mg of elemental calcium Oral Given 10/30/22 2222)  oseltamivir (TAMIFLU) capsule 75 mg (75 mg Oral Given 10/30/22 2226)  sodium zirconium cyclosilicate (LOKELMA) packet 10 g (10 g Oral Given 10/30/22 2023)  sodium chloride 0.9 % bolus 1,000 mL (1,000 mLs Intravenous New Bag/Given 10/30/22 2026)    ED Course/ Medical Decision Making/ A&P   {                            Medical Decision Making  STEVIN PIECUCH is a 89 y.o. male.  With PMH of carotid artery disease, MGUS, severe AS s/p of aortic valve replacement 2013 who presents after syncopal episode.  Although symptoms are most consistent with vasovagal episode, concern for history of aortic stenosis and valve replacement for possible cardiac etiology especially  with new hypocalcemia today which could lead to arrhythmia.  Although he did have incontinence he had no postictal episode or shaking or other symptoms concerning for seizure.  He is neurologically intact on exam doubt CVA.  CT head was unremarkable reviewed by me.  No ICH.  His EKG looks similar to prior sinus rhythm with no acute ST/T changes no chest pain, doubt ACS however troponins have elevated 19 repeat 27.  Initial CMP was hemolyzed -given fluids and Lokelma, so repeat BMP obtained.  Concerning for severe hypocalcemia 6.2. Ordered for IV calcium gluconate. Also had mild hypokalemia 3.1.  He is flu positive but no pneumonia on chest x-ray.  Not hypoxic.  Due to syncopal episode new hypocalcemia and history of aortic valve replacement with uptrending troponins, discussed with Dr. Claria Dice for admission for syncope workup, echocardiogram, cardiac monitoring, calcium repletion.    Amount and/or Complexity of Data Reviewed Labs: ordered. Radiology: ordered.  Risk Prescription drug management. Decision regarding hospitalization.   Final Clinical Impression(s) / ED Diagnoses Final diagnoses:  Syncope, unspecified syncope type  Influenza A  Elevated troponin  Hypocalcemia    Rx / DC Orders ED Discharge Orders     None         Elgie Congo, MD 10/30/22 2255

## 2022-10-31 DIAGNOSIS — R55 Syncope and collapse: Secondary | ICD-10-CM | POA: Diagnosis not present

## 2022-10-31 LAB — CBC
HCT: 33.5 % — ABNORMAL LOW (ref 39.0–52.0)
Hemoglobin: 11.5 g/dL — ABNORMAL LOW (ref 13.0–17.0)
MCH: 30.9 pg (ref 26.0–34.0)
MCHC: 34.3 g/dL (ref 30.0–36.0)
MCV: 90.1 fL (ref 80.0–100.0)
Platelets: 181 10*3/uL (ref 150–400)
RBC: 3.72 MIL/uL — ABNORMAL LOW (ref 4.22–5.81)
RDW: 12.9 % (ref 11.5–15.5)
WBC: 6.1 10*3/uL (ref 4.0–10.5)
nRBC: 0 % (ref 0.0–0.2)

## 2022-10-31 LAB — BASIC METABOLIC PANEL
Anion gap: 8 (ref 5–15)
BUN: 8 mg/dL (ref 8–23)
CO2: 20 mmol/L — ABNORMAL LOW (ref 22–32)
Calcium: 7.7 mg/dL — ABNORMAL LOW (ref 8.9–10.3)
Chloride: 103 mmol/L (ref 98–111)
Creatinine, Ser: 0.73 mg/dL (ref 0.61–1.24)
GFR, Estimated: >60 mL/min (ref >60)
Glucose, Bld: 92 mg/dL (ref 70–99)
Potassium: 3.3 mmol/L — ABNORMAL LOW (ref 3.5–5.1)
Sodium: 131 mmol/L — ABNORMAL LOW (ref 135–145)

## 2022-10-31 MED ORDER — ACETAMINOPHEN 325 MG PO TABS: 650.0000 mg | ORAL_TABLET | Freq: Four times a day (QID) | ORAL | Status: DC | PRN

## 2022-10-31 MED ORDER — POTASSIUM CHLORIDE CRYS ER 20 MEQ PO TBCR
40.0000 meq | EXTENDED_RELEASE_TABLET | Freq: Once | ORAL | Status: AC
  Administered 2022-10-31: 40 meq via ORAL
  Filled 2022-10-31: qty 2

## 2022-10-31 MED ORDER — ENOXAPARIN SODIUM 40 MG/0.4ML IJ SOSY: 40.0000 mg | PREFILLED_SYRINGE | INTRAMUSCULAR | Status: DC

## 2022-10-31 MED ORDER — SENNOSIDES-DOCUSATE SODIUM 8.6-50 MG PO TABS: 1.0000 | ORAL_TABLET | Freq: Every evening | ORAL | Status: DC | PRN

## 2022-10-31 MED ORDER — OSELTAMIVIR PHOSPHATE 75 MG PO CAPS: 75.0000 mg | ORAL_CAPSULE | Freq: Two times a day (BID) | ORAL | 0 refills | Status: AC

## 2022-10-31 MED ORDER — SODIUM CHLORIDE 0.9 % IV BOLUS (SEPSIS)
1000.0000 mL | Freq: Once | INTRAVENOUS | Status: AC
  Administered 2022-10-31: 1000 mL via INTRAVENOUS

## 2022-10-31 MED ORDER — ONDANSETRON HCL 4 MG/2ML IJ SOLN: 4.0000 mg | Freq: Four times a day (QID) | INTRAMUSCULAR | Status: DC | PRN

## 2022-10-31 MED ORDER — ACETAMINOPHEN 650 MG RE SUPP: 650.0000 mg | Freq: Four times a day (QID) | RECTAL | Status: DC | PRN

## 2022-10-31 MED ORDER — ATORVASTATIN CALCIUM 40 MG PO TABS
40.0000 mg | ORAL_TABLET | Freq: Every day | ORAL | Status: DC
  Administered 2022-10-31: 40 mg via ORAL
  Filled 2022-10-31: qty 1

## 2022-10-31 MED ORDER — ONDANSETRON HCL 4 MG PO TABS: 4.0000 mg | ORAL_TABLET | Freq: Four times a day (QID) | ORAL | Status: DC | PRN

## 2022-10-31 MED ORDER — SODIUM CHLORIDE 0.9 % IV BOLUS: 1000.0000 mL | Freq: Once | INTRAVENOUS | Status: DC

## 2022-10-31 MED ORDER — POTASSIUM CHLORIDE IN NACL 20-0.9 MEQ/L-% IV SOLN
INTRAVENOUS | Status: DC
  Filled 2022-10-31: qty 1000

## 2022-10-31 MED ORDER — ASPIRIN 81 MG PO TBEC
81.0000 mg | DELAYED_RELEASE_TABLET | Freq: Every day | ORAL | Status: DC
  Administered 2022-10-31: 81 mg via ORAL
  Filled 2022-10-31: qty 1

## 2022-10-31 MED ORDER — BENZONATATE 100 MG PO CAPS: 100.0000 mg | ORAL_CAPSULE | Freq: Three times a day (TID) | ORAL | Status: DC | PRN

## 2022-10-31 MED ORDER — IRBESARTAN 75 MG PO TABS
75.0000 mg | ORAL_TABLET | Freq: Every day | ORAL | Status: DC
  Administered 2022-10-31: 75 mg via ORAL
  Filled 2022-10-31: qty 1

## 2022-10-31 NOTE — ED Notes (Signed)
Pt found to be soiled in urine. Bed linens and gown changed. Resting comfortably at this time.

## 2022-10-31 NOTE — Discharge Summary (Signed)
Physician Discharge Summary  Andrew Bonilla [id number] DOB: [birth year] DOA: 10/30/2022  PCP: Wenda Low, MD  Admit date: 10/30/2022 Discharge date: 10/31/2022  Admitted From: Home Disposition: Home  Recommendations for Outpatient Follow-up:  Follow up with PCP in 1-2 weeks Continue Tamiflu to complete 5-day course for treatment of influenza A viral infection Please obtain BMP in one week to reassess potassium level and calcium level  Home Health: No needs identified by physical therapy Equipment/Devices: None  Discharge Condition: Stable CODE STATUS: Full code Diet recommendation: Heart healthy diet  History of present illness:  Andrew Bonilla is an 89 year old male with past medical history significant for essential hypertension, hyperlipidemia, carotid artery disease, BPH, history of aortic valve replacement 2013 who presented to [facility] ED on 2/27 from home via EMS following syncopal episode.  As his wife was preparing dinner, he reports the smell was making him nauseous and then he fell to the floor.  Wife was present during episode without apparent any reported or like activity.  Recently returned home from a trip to [place] on Friday and since has had coughing and sneezing with generalized weakness.  Denies shortness of breath, no chest pain, no dizziness/lightheadedness.  Although patient states he did not syncopized and just "fell".  In the ED, temperature 98.7 F, HR's 82, RR 22, BP 140/66, SpO2 98% on room air.  Sodium 131, potassium 6.1, chloride 97, CO2 20, glucose 97, BUN 11, creatinine 0.86.  AST 58, ALT 20, total bilirubin 1.8.  WBC 8.2, hemoglobin 13.7, platelets 223.  High sensitive troponin 19.  Influenza A PCR positive.  COVID/RSV PCR negative.  Urinalysis unremarkable.  Chest x-ray with poor inflation with bibasilar atelectasis and bronchovascular crowding.  CT head without contrast with no acute intracranial abnormality, mild diffuse atrophy.  CT C-spine without  contrast with no acute cervical spine fracture, multilevel spondylosis and facet hypertrophy.  Patient was given Lokelma by EDP.  TRH consulted for admission.   Hospital course:  Syncopal versus presyncopal episode Presenting to the ED via EMS after "fall" in the kitchen as his wife was making dinner.  He reported that the smell was making him nauseous.  No reports of seizure-like activity; no loss of bowel bladder function or tongue biting.  Orthostatic vital signs unrevealing per nursing staff.  Patient feels like he is back at his typical baseline.  Etiology likely secondary to acute viral illness with influenza that was positive on admission.  No arrhythmias noted on telemetry during hospitalization.  Recommend outpatient follow-up with PCP, continue to encourage increased oral intake/fluid hydration.  Outpatient follow with cardiology, consideration for heart monitor.  Influenza A viral infection Recent returned from [place] and since has felt ill, weak with coughing and sneezing.  Chest x-ray with no focal consolidation.  Influenza A PCR positive.  Continue Tamiflu to complete 5-day course.  Essential hypertension Continue irbesartan 75 mg p.o. daily.  Hyperlipidemia Continue atorvastatin 40 mg p.o. daily  Hypocalcemia Calcium 6.2 on admission, repleted with IV calcium gluconate.  Repeat calcium level 7.7.  Recommend repeat BMP 1 week.  Hyperkalemia likely secondary to hemolysis Potassium 6.1 admission, although was reported as hemolyzed; but was given Lokelma by EDP.  Repeat potassium level now low at 3.1 and was subsequently given supplementation.  Recommend repeat BMP 1 week.  Discharge Diagnoses:  Principal Problem:   Syncope and collapse Active Problems:   History of prosthetic aortic valve   Essential hypertension   Mixed hyperlipidemia   Hypocalcemia   Hypokalemia  Influenza A    Discharge Instructions  Discharge Instructions     Call MD for:  difficulty  breathing, headache or visual disturbances   Complete by: As directed    Call MD for:  extreme fatigue   Complete by: As directed    Call MD for:  persistant dizziness or light-headedness   Complete by: As directed    Call MD for:  persistant nausea and vomiting   Complete by: As directed    Call MD for:  severe uncontrolled pain   Complete by: As directed    Call MD for:  temperature >100.4   Complete by: As directed    Diet - low sodium heart healthy   Complete by: As directed    Increase activity slowly   Complete by: As directed       Allergies as of 10/31/2022   No Known Allergies      Medication List     TAKE these medications    amoxicillin 500 MG tablet Commonly known as: AMOXIL amoxicillin 500 mg tablet  4 tablets 1 hour prior to dental procedure   aspirin EC 81 MG tablet Take 81 mg by mouth daily.   atorvastatin 40 MG tablet Commonly known as: LIPITOR TAKE 1 TABLET BY MOUTH EVERY DAY   benzonatate 100 MG capsule Commonly known as: TESSALON Take 1 capsule (100 mg total) by mouth every 8 (eight) hours.   esomeprazole 40 MG capsule Commonly known as: NEXIUM Take 40 mg by mouth daily at 12 noon.   HYDROcodone bit-homatropine 5-1.5 MG/5ML syrup Commonly known as: HYCODAN Take 5 mLs by mouth at bedtime as needed.   hydrocortisone cream 1 % Apply 1 application topically 2 (two) times daily as needed for itching.   irbesartan 75 MG tablet Commonly known as: AVAPRO TAKE 1 TABLET BY MOUTH EVERY DAY   oseltamivir 75 MG capsule Commonly known as: TAMIFLU Take 1 capsule (75 mg total) by mouth 2 (two) times daily for 5 days.   polyethylene glycol 17 g packet Commonly known as: MIRALAX / GLYCOLAX Take 17 g by mouth 2 (two) times daily.   saw palmetto 80 MG capsule Take 80 mg by mouth 2 (two) times daily.   tadalafil 10 MG tablet Commonly known as: CIALIS Take 10 mg by mouth daily as needed for erectile dysfunction.   traMADol 50 MG tablet Commonly  known as: ULTRAM Take 1 tablet (50 mg total) by mouth every 6 (six) hours as needed.        Follow-up Information     Wenda Low, MD. Schedule an appointment as soon as possible for a visit in 1 week(s).   Specialty: Internal Medicine Contact information: [address] [place] [address] [phone]                No Known Allergies  Consultations: None   Procedures/Studies: CT Cervical Spine Wo Contrast  Result Date: 10/30/2022 CLINICAL DATA:  Syncope, head trauma EXAM: CT CERVICAL SPINE WITHOUT CONTRAST TECHNIQUE: Multidetector CT imaging of the cervical spine was performed without intravenous contrast. Multiplanar CT image reconstructions were also generated. RADIATION DOSE REDUCTION: This exam was performed according to the departmental dose-optimization program which includes automated exposure control, adjustment of the mA and/or kV according to patient size and/or use of iterative reconstruction technique. COMPARISON:  None Available. FINDINGS: Alignment: Alignment is grossly anatomic. Skull base and vertebrae: No acute fracture. No primary bone lesion or focal pathologic process. Soft tissues and spinal  canal: No prevertebral fluid or swelling. No visible canal hematoma. Disc levels: There is diffuse facet hypertrophy. Prominent spondylosis at C6-7. No significant bony encroachment on the central canal. Mild right predominant neural foraminal narrowing at C3-4 and C4-5 due to facet hypertrophy. Upper chest: Airway is widely patent.  Lung apices are clear. Other: Reconstructed images demonstrate no additional findings. IMPRESSION: 1. No acute cervical spine fracture. 2. Multilevel spondylosis and facet hypertrophy as above. Electronically Signed   By: Randa Ngo M.D.   On: 10/30/2022 21:26   CT Head Wo Contrast  Result Date: 10/30/2022 CLINICAL DATA:  Syncope and head trauma EXAM: CT HEAD WITHOUT CONTRAST TECHNIQUE: Contiguous axial images were  obtained from the base of the skull through the vertex without intravenous contrast. RADIATION DOSE REDUCTION: This exam was performed according to the departmental dose-optimization program which includes automated exposure control, adjustment of the mA and/or kV according to patient size and/or use of iterative reconstruction technique. COMPARISON:  Head CT 06/25/2019.  MRI head 06/15/2018. FINDINGS: Brain: No evidence of acute infarction, hemorrhage, hydrocephalus, extra-axial collection or mass lesion/mass effect. Again seen is mild diffuse atrophy. Vascular: Atherosclerotic calcifications are present within the cavernous internal carotid arteries. Skull: Normal. Negative for fracture or focal lesion. Sinuses/Orbits: There is diffuse mucosal thickening of ethmoid air cells and paranasal sinuses. Orbits are within normal limits. Other: None. IMPRESSION: 1. No acute intracranial abnormality. 2. Mild diffuse atrophy. 3. Paranasal sinus disease. Electronically Signed   By: Ronney Asters M.D.   On: 10/30/2022 21:23   DG Chest 2 View  Result Date: 10/30/2022 CLINICAL DATA:  Cough.  Congestion EXAM: CHEST - 2 VIEW COMPARISON:  Chest x-ray 12/27/2021 and older.  CT 11/14/2021 FINDINGS: Underinflation. Sternal wires. Prosthetic aortic valve. There is some linear opacity lung bases likely scar or atelectasis. No pneumothorax or effusion. Bronchovascular crowding. Calcified aorta. Borderline cardiopericardial silhouette. Multiple loops of air-filled bowel are seen beneath the diaphragm including along the right side. Degenerative changes of the spine. There is severe compression deformity of the lower thoracic spine vertebral level, unchanged from previous. There is some associated kyphosis. IMPRESSION: Poor inflation with basilar atelectasis and bronchovascular crowding. Postop chest. Electronically Signed   By: Jill Side M.D.   On: 10/30/2022 19:24     Subjective: Bedside, resting comfortably.  Lying in bed,  continues to hold in the ED.  Patient states feels back to normal and ready for discharge home.  Orthostatic vital signs unrevealing.  Seen by PT with no needs identified.  Discussed etiology of his presenting symptoms likely related to his current viral infection with influenza A.  Encouraged to complete course of Tamiflu.  States "I do not like the breakfast here", I usually "eat at [place]".  No other questions or concerns at this time.  Denies headache, no dizziness, no chest pain, no palpitations, no shortness of breath, no fever/chills/night sweats, no nausea/vomiting/diarrhea, no focal weakness, no fatigue, no paresthesias.  No acute events overnight per nursing staff.  Discharge Exam: Vitals:   10/31/22 0800 10/31/22 1000  BP: (!) 178/86 (!) 170/102  Pulse: 95 88  Resp: (!) 22 16  Temp: 98.5 F (36.9 C) 98.5 F (36.9 C)  SpO2: 97% 98%   Vitals:   10/31/22 0430 10/31/22 0700 10/31/22 0800 10/31/22 1000  BP: (!) 171/81 (!) 169/84 (!) 178/86 (!) 170/102  Pulse: 95 96 95 88  Resp: (!) 22 (!) 22 (!) 22 16  Temp: 98.9 F (37.2 C)  98.5 F (36.9  C) 98.5 F (36.9 C)  TempSrc: Oral  Oral   SpO2: 97% 99% 97% 98%  Weight:   97 kg 97 kg  Height:   '5\' 6"'$  (1.676 m) '5\' 6"'$  (1.676 m)    Physical Exam: GEN: NAD, alert and oriented x 3, elderly appearance HEENT: NCAT, PERRL, EOMI, sclera clear, MMM PULM: CTAB w/o wheezes/crackles, normal respiratory effort, on room air CV: RRR w/o M/G/R GI: abd soft, NTND, NABS, no R/G/M MSK: no peripheral edema, moves all extremities independently NEURO: CN II-XII intact, no focal deficits, sensation to light touch intact PSYCH: normal mood/affect Integumentary: No appreciable rashes/wounds/lesions noted on exposed skin surfaces    The results of significant diagnostics from this hospitalization (including imaging, microbiology, ancillary and laboratory) are listed below for reference.     Microbiology: Recent Results (from the past 240  hour(s))  Resp panel by RT-PCR (RSV, Flu A&B, Covid) Anterior Nasal Swab     Status: Abnormal   Collection Time: 10/30/22  6:33 PM   Specimen: Anterior Nasal Swab  Result Value Ref Range Status   SARS Coronavirus 2 by RT PCR NEGATIVE NEGATIVE Final   Influenza A by PCR POSITIVE (A) NEGATIVE Final   Influenza B by PCR NEGATIVE NEGATIVE Final    Comment: (NOTE) The Xpert Xpress SARS-CoV-2/FLU/RSV plus assay is intended as an aid in the diagnosis of influenza from Nasopharyngeal swab specimens and should not be used as a sole basis for treatment. Nasal washings and aspirates are unacceptable for Xpert Xpress SARS-CoV-2/FLU/RSV testing.  Fact Sheet for Patients: EntrepreneurPulse.com.au  Fact Sheet for Healthcare Providers: IncredibleEmployment.be  This test is not yet approved or cleared by the [place] FDA and has been authorized for detection and/or diagnosis of SARS-CoV-2 by FDA under an Emergency Use Authorization (EUA). This EUA will remain in effect (meaning this test can be used) for the duration of the COVID-19 declaration under Section 564(b)(1) of the Act, 21 U.S.C. section 360bbb-3(b)(1), unless the authorization is terminated or revoked.     Resp Syncytial Virus by PCR NEGATIVE NEGATIVE Final    Comment: (NOTE) Fact Sheet for Patients: EntrepreneurPulse.com.au  Fact Sheet for Healthcare Providers: IncredibleEmployment.be  This test is not yet approved or cleared by the [place] FDA and has been authorized for detection and/or diagnosis of SARS-CoV-2 by FDA under an Emergency Use Authorization (EUA). This EUA will remain in effect (meaning this test can be used) for the duration of the COVID-19 declaration under Section 564(b)(1) of the Act, 21 U.S.C. section 360bbb-3(b)(1), unless the authorization is terminated or revoked.  Performed at [facility], [place] [address],  [place], [place] [address]      Labs: BNP (last 3 results) No results for input(s): "BNP" in the last 8760 hours. Basic Metabolic Panel: Recent Labs  Lab 10/30/22 1845 10/30/22 2107 10/30/22 2249 10/31/22 0838  NA 131* 136  --  131*  K 6.1* 3.1*  --  3.3*  CL 97* 110  --  103  CO2 20* 19*  --  20*  GLUCOSE 97 88  --  92  BUN 11 9  --  8  CREATININE 0.86 0.58*  --  0.73  CALCIUM 8.4* 6.2*  --  7.7*  MG  --   --  2.1  --    Liver Function Tests: Recent Labs  Lab 10/30/22 1845  AST 58*  ALT 20  ALKPHOS 59  BILITOT 1.8*  PROT 6.7  ALBUMIN 4.0   No results for input(s): "  LIPASE", "AMYLASE" in the last 168 hours. No results for input(s): "AMMONIA" in the last 168 hours. CBC: Recent Labs  Lab 10/30/22 1854 10/31/22 0838  WBC 8.2 6.1  NEUTROABS 5.6  --   HGB 13.7 11.5*  HCT 39.4 33.5*  MCV 89.7 90.1  PLT 223 181   Cardiac Enzymes: No results for input(s): "CKTOTAL", "CKMB", "CKMBINDEX", "TROPONINI" in the last 168 hours. BNP: Invalid input(s): "POCBNP" CBG: Recent Labs  Lab 10/30/22 1817  GLUCAP 101*   D-Dimer No results for input(s): "DDIMER" in the last 72 hours. Hgb A1c No results for input(s): "HGBA1C" in the last 72 hours. Lipid Profile No results for input(s): "CHOL", "HDL", "LDLCALC", "TRIG", "CHOLHDL", "LDLDIRECT" in the last 72 hours. Thyroid function studies No results for input(s): "TSH", "T4TOTAL", "T3FREE", "THYROIDAB" in the last 72 hours.  Invalid input(s): "FREET3" Anemia work up No results for input(s): "VITAMINB12", "FOLATE", "FERRITIN", "TIBC", "IRON", "RETICCTPCT" in the last 72 hours. Urinalysis    Component Value Date/Time   COLORURINE YELLOW 10/30/2022 1935   APPEARANCEUR CLEAR 10/30/2022 1935   LABSPEC 1.010 10/30/2022 1935   PHURINE 7.5 10/30/2022 1935   GLUCOSEU NEGATIVE 10/30/2022 1935   HGBUR NEGATIVE 10/30/2022 1935   BILIRUBINUR NEGATIVE 10/30/2022 1935   KETONESUR 15 (A) 10/30/2022 1935   PROTEINUR NEGATIVE 10/30/2022  1935   UROBILINOGEN 0.2 01/14/2012 1529   NITRITE NEGATIVE 10/30/2022 1935   LEUKOCYTESUR NEGATIVE 10/30/2022 1935   Sepsis Labs Recent Labs  Lab 10/30/22 1854 10/31/22 0838  WBC 8.2 6.1   Microbiology Recent Results (from the past 240 hour(s))  Resp panel by RT-PCR (RSV, Flu A&B, Covid) Anterior Nasal Swab     Status: Abnormal   Collection Time: 10/30/22  6:33 PM   Specimen: Anterior Nasal Swab  Result Value Ref Range Status   SARS Coronavirus 2 by RT PCR NEGATIVE NEGATIVE Final   Influenza A by PCR POSITIVE (A) NEGATIVE Final   Influenza B by PCR NEGATIVE NEGATIVE Final    Comment: (NOTE) The Xpert Xpress SARS-CoV-2/FLU/RSV plus assay is intended as an aid in the diagnosis of influenza from Nasopharyngeal swab specimens and should not be used as a sole basis for treatment. Nasal washings and aspirates are unacceptable for Xpert Xpress SARS-CoV-2/FLU/RSV testing.  Fact Sheet for Patients: EntrepreneurPulse.com.au  Fact Sheet for Healthcare Providers: IncredibleEmployment.be  This test is not yet approved or cleared by the [place] FDA and has been authorized for detection and/or diagnosis of SARS-CoV-2 by FDA under an Emergency Use Authorization (EUA). This EUA will remain in effect (meaning this test can be used) for the duration of the COVID-19 declaration under Section 564(b)(1) of the Act, 21 U.S.C. section 360bbb-3(b)(1), unless the authorization is terminated or revoked.     Resp Syncytial Virus by PCR NEGATIVE NEGATIVE Final    Comment: (NOTE) Fact Sheet for Patients: EntrepreneurPulse.com.au  Fact Sheet for Healthcare Providers: IncredibleEmployment.be  This test is not yet approved or cleared by the [place] FDA and has been authorized for detection and/or diagnosis of SARS-CoV-2 by FDA under an Emergency Use Authorization (EUA). This EUA will remain in effect (meaning this  test can be used) for the duration of the COVID-19 declaration under Section 564(b)(1) of the Act, 21 U.S.C. section 360bbb-3(b)(1), unless the authorization is terminated or revoked.  Performed at [facility], [place] [address], [place], [place] [address]      Time coordinating discharge: Over 30 minutes  SIGNED:   Donnamarie Poag [place] DO  [facility]  10/31/2022, 12:15 PM

## 2022-10-31 NOTE — Progress Notes (Signed)
Physical Therapy Evaluation Patient Details Name: Andrew Bonilla MRN: [id number] DOB: 15-May-1933 Today's Date: 10/31/2022  History of Present Illness  89 yo male with onset of syncopal episode and then fall was admitted to hosp on 2/27.  Pt was found to have flu, no findings in imaging for striking his head in fall.  PMHx:  HTN, HLD, carotid artery disease, BPH, aortic valve replacement, cervical spondylosis, cervical facet hypertrophy  Clinical Impression  Pt was seen for mobility with wife in attendance after being admitted for a syncopal episode at home.  Pt is demonstrating some difficulty with understanding the safety concerns from PT and has good obvious support from his wife.  Noted pt had the following BP readings:  supine 165/74 pulse 78, sat 96%; sitting 158/77 pulse 90 sat 99%; standing 162/72 pulse 90 sat 97%; post walk 172/69, sat 97% and pulse 87, all map readings 98-102.  Pt is able to walk supervised on hallway, but with hearing issues may be poorly understanding PT concern to maintain safety with all mobility.  Follow up with PCP, and no further PT is ordered for now.        Recommendations for follow up therapy are one component of a multi-disciplinary discharge planning process, led by the attending physician.  Recommendations may be updated based on patient status, additional functional criteria and insurance authorization.  Follow Up Recommendations No PT follow up      Assistance Recommended at Discharge Set up Supervision/Assistance  Patient can return home with the following  Assistance with cooking/housework;Direct supervision/assist for medications management;Direct supervision/assist for financial management;Assist for transportation;Help with stairs or ramp for entrance    Equipment Recommendations None recommended by PT  Recommendations for Other Services       Functional Status Assessment Patient has not had a recent decline in their functional status      Precautions / Restrictions Precautions Precautions: Fall Precaution Comments: monitor BP Restrictions Weight Bearing Restrictions: No      Mobility  Bed Mobility Overal bed mobility: Needs Assistance Bed Mobility: Supine to Sit, Sit to Supine     Supine to sit: Mod assist Sit to supine: Supervision   General bed mobility comments: mod assist due to confined spaces on gurney and body habitus    Transfers Overall transfer level: Needs assistance Equipment used: None Transfers: Sit to/from Stand Sit to Stand: Min guard (for safety)           General transfer comment: pt tends to pull away from PT if trying to make contact support during standing    Ambulation/Gait Ambulation/Gait assistance: Min guard Gait Distance (Feet): 110 Feet Assistive device: 1 person hand held assist Gait Pattern/deviations: Step-through pattern, Decreased stride length, Wide base of support Gait velocity: reduced Gait velocity interpretation: <1.31 ft/sec, indicative of household ambulator Pre-gait activities: standing blood pressure ck General Gait Details: pt is making some accommodations for his general body habitus but does not require support to walk  Stairs            Wheelchair Mobility    Modified Rankin (Stroke Patients Only)       Balance Overall balance assessment: Needs assistance, History of Falls Sitting-balance support: Feet supported Sitting balance-Leahy Scale: Fair     Standing balance support: Single extremity supported Standing balance-Leahy Scale: Fair                               Pertinent Vitals/Pain  Pain Assessment Pain Assessment: Faces Faces Pain Scale: Hurts a little bit Pain Location: back from old injury Pain Descriptors / Indicators: Guarding Pain Intervention(s): Monitored during session, Repositioned    Home Living Family/patient expects to be discharged to:: Private residence Living Arrangements: Spouse/significant  other Available Help at Discharge: Family;Available 24 hours/day Type of Home: House Home Access: Stairs to enter Entrance Stairs-Rails: Can reach both Entrance Stairs-Number of Steps: 2   Home Layout: One level Home Equipment: Rolling Walker (2 wheels);Grab bars - tub/shower;Shower seat Additional Comments: Pt has been able to walk without AD and no assist, just returned from vacation to [facility] with a lot of walking    Prior Function Prior Level of Function : Independent/Modified Independent             Mobility Comments: no AD to walk outside typically       Hand Dominance   Dominant Hand: Right    Extremity/Trunk Assessment   Upper Extremity Assessment Upper Extremity Assessment: Overall WFL for tasks assessed    Lower Extremity Assessment Lower Extremity Assessment: Overall WFL for tasks assessed    Cervical / Trunk Assessment Cervical / Trunk Assessment: Other exceptions;Kyphotic (had compression fracture in history)  Communication   Communication: HOH (uses augmented hearing devices)  Cognition Arousal/Alertness: Awake/alert Behavior During Therapy: WFL for tasks assessed/performed Overall Cognitive Status: Difficult to assess                                 General Comments: Pt seems mildly impaired as he is not able to stay on task with conversation but is HOH and may not interpret all conversation        General Comments General comments (skin integrity, edema, etc.): pt was observed for mobility as PT was checking his BP, pt is fairly independent and discouraging PT from trying to maintain contact with him for safety observation    Exercises     Assessment/Plan    PT Assessment Patient does not need any further PT services  PT Problem List         PT Treatment Interventions      PT Goals (Current goals can be found in the Care Plan section)  Acute Rehab PT Goals Patient Stated Goal: to get home today PT Goal Formulation: All  assessment and education complete, DC therapy    Frequency       Co-evaluation               AM-PAC PT "6 Clicks" Mobility  Outcome Measure Help needed turning from your back to your side while in a flat bed without using bedrails?: A Lot Help needed moving from lying on your back to sitting on the side of a flat bed without using bedrails?: A Lot Help needed moving to and from a bed to a chair (including a wheelchair)?: A Little Help needed standing up from a chair using your arms (e.g., wheelchair or bedside chair)?: A Little Help needed to walk in hospital room?: A Little Help needed climbing 3-5 steps with a railing? : A Little 6 Click Score: 16    End of Session   Activity Tolerance: Patient limited by fatigue Patient left: in bed;with call bell/phone within reach;with family/visitor present Nurse Communication: Mobility status;Other (comment) (shared vitals with team) PT Visit Diagnosis: History of falling (Z91.81)    Time: [id number] PT Time Calculation (min) (ACUTE ONLY): 33 min   Charges:  PT Evaluation $PT Eval Moderate Complexity: 1 Mod PT Treatments $Gait Training: 8-22 mins       Ramond Dial 10/31/2022, 1:07 PM  Mee Hives, PT PhD Acute Rehab Dept. Number: [place] and [place]

## 2022-11-02 ENCOUNTER — Ambulatory Visit: Payer: Medicare Other | Attending: General Practice | Admitting: General Practice

## 2022-11-02 ENCOUNTER — Encounter: Payer: Self-pay | Admitting: General Practice

## 2022-11-02 VITALS — BP 118/66 | HR 72 | Ht 66.0 in | Wt 213.0 lb

## 2022-11-02 DIAGNOSIS — R55 Syncope and collapse: Secondary | ICD-10-CM | POA: Diagnosis not present

## 2022-11-02 DIAGNOSIS — E782 Mixed hyperlipidemia: Secondary | ICD-10-CM

## 2022-11-02 DIAGNOSIS — Z952 Presence of prosthetic heart valve: Secondary | ICD-10-CM

## 2022-11-02 DIAGNOSIS — I10 Essential (primary) hypertension: Secondary | ICD-10-CM | POA: Diagnosis not present

## 2022-11-02 DIAGNOSIS — I6523 Occlusion and stenosis of bilateral carotid arteries: Secondary | ICD-10-CM

## 2022-11-02 LAB — CALCIUM, IONIZED: Calcium, Ionized, Serum: 4.4 mg/dL — ABNORMAL LOW (ref 4.5–5.6)

## 2022-11-02 NOTE — Progress Notes (Signed)
Cardiology Clinic Note   Patient Name: BENTLIE MANSKER Date of Encounter: 11/02/2022  Primary Care Provider:  Wenda Low, MD Primary Cardiologist:  Pixie Casino, MD  Patient Profile    CECILIA FORDHAM 89 year old male presents to the clinic today for follow-up evaluation of his hypertension and near syncope.  Past Medical History    Past Medical History:  Diagnosis Date   Arthritis    Atherosclerosis of abdominal aorta ([place]) 2015   Carotid artery disease ([place]) 2015   Constipation    Deaf, left    WEARS HEARING AID RIGHT EAR    GERD (gastroesophageal reflux disease)    Hard of hearing    hearing aid in R ear   Heart murmur    mod-severe aortic stenosis    Hyperlipemia    Hypertension    Osteoarthritis of knee 2019   Left knee replacement   Pneumonia    in college   Prostate enlargement    BPH   Recurrent upper respiratory infection (URI)    12/2011   S/P AVR (aortic valve replacement) 01/16/2012   [facility] Ease 66m pericardial tissue valve   Past Surgical History:  Procedure Laterality Date   AORTIC VALVE REPLACEMENT  01/17/2012   Procedure: AORTIC VALVE REPLACEMENT (AVR);  Surgeon: PIvin Poot MD;  [facility]  Service: Open Heart Surgery;  Laterality: N/A;   CARDIAC CATHETERIZATION  12/17/2011   mild nonobstructive CAD - 20% LAD stenosis, 30% OM stenosis, 20% Cfx, 20% RCA lesion   JOINT REPLACEMENT     Left total knee Dr. AWynelle Link10-7-19   LEFT HEART CATHETERIZATION WITH CORONARY ANGIOGRAM N/A 12/17/2011   Procedure: LEFT HEART CATHETERIZATION WITH CORONARY ANGIOGRAM;  Surgeon: KPixie Casino MD;  [facility]  Service: Cardiovascular;  Laterality: N/A;   TEE WITHOUT CARDIOVERSION  01/2012   EF 50-55%; calcified AV annulus, severely calcified AV leaflets, mild regurg (AVR)   TOTAL KNEE ARTHROPLASTY Left 06/09/2018   Procedure: LEFT TOTAL KNEE ARTHROPLASTY;  Surgeon: AGaynelle Arabian MD;  Location: [facility];  Service: Orthopedics;   Laterality: Left;   TOTAL KNEE ARTHROPLASTY Right 06/22/2019   Procedure: TOTAL KNEE ARTHROPLASTY;  Surgeon: AGaynelle Arabian MD;  Location: [facility];  Service: Orthopedics;  Laterality: Right;  557m    Allergies  No Known Allergies  History of Present Illness    PeMCCOY PITZas a PMH of elevated troponin, influenza A infection, syncope, hypocalcemia, carotid artery disease, aortic stenosis status post aortic valve replacement 2013, and HTN.  He presented to the emergency department on 10/30/2022 and was discharged on 10/31/2022.  He reported that he was standing cooking in his kitchen.  He felt faint and collapsed to the ground.  He hit the front of his head.  He lost consciousness for a brief period and did have some urinary incontinence.  No seizure-like activity was noted.  No postictal symptoms were noted.  He denied chest pain shortness of breath and palpitations.  He did report cough and congestion as well as nasal drainage after a [place] He reported the spent prolonged periods of time in the car with the drive.  He denied leg pain and lower extremity edema.  He reported intermittent spells of shortness of breath.  He was neurologically intact.  His blood pressure in the emergency department was noted to be 151/71 with a pulse of 85.  He tested positive for flu A.  His EKG showed  sinus rhythm 80 bpm.  It was felt that his episode was related to vasovagal syncope.  He was noted to have hypocalcemia.  He underwent head CT which was unremarkable.  Troponins were noted to be 19 and 27.  He received IV calcium gluconate.  He was also noted to be hypokalemic with a K of 3.1.  Chest x-ray showed no acute process.  He was not hypoxic.  No other arrhythmias were noted on telemetry.  Outpatient recommended with PCP.  He was courage to continue his p.o. hydration.  He was also instructed to follow-up with cardiology.  He presents to the clinic today for follow-up evaluation and states he  feels well today.  He has had no further episodes of syncope or presyncope.  We reviewed his hospitalization.  He and his wife expressed understanding.  He reports that when the episode happened he felt like he was seasick.  He reports that he did not lose consciousness.  His wife feels like he lost consciousness for a brief episode.  We used shared decision-making to discuss cardiac event monitor and they wish to defer monitor at this time.  We also discussed echocardiogram and repeating carotid Doppler.  He wishes to proceed with Doppler and echocardiogram test.  We will plan follow-up for after testing.  Today he denies chest pain, shortness of breath, lower extremity edema, fatigue, palpitations, melena, hematuria, hemoptysis, diaphoresis, weakness, presyncope, syncope, orthopnea, and PND.    Home Medications    Prior to Admission medications   Medication Sig Start Date End Date Taking? Authorizing Provider  amoxicillin (AMOXIL) 500 MG tablet amoxicillin 500 mg tablet  4 tablets 1 hour prior to dental procedure    [provider]  aspirin EC 81 MG tablet Take 81 mg by mouth daily.    [provider]  atorvastatin (LIPITOR) 40 MG tablet TAKE 1 TABLET BY MOUTH EVERY DAY 02/06/22   Hilty, Nadean Corwin, MD  benzonatate (TESSALON) 100 MG capsule Take 1 capsule (100 mg total) by mouth every 8 (eight) hours. 05/08/21   Ward, Lenise Arena, PA-C  esomeprazole (NEXIUM) 40 MG capsule Take 40 mg by mouth daily at 12 noon.     [provider]  HYDROcodone bit-homatropine (HYCODAN) 5-1.5 MG/5ML syrup Take 5 mLs by mouth at bedtime as needed. 06/26/21   [provider]  hydrocortisone cream 1 % Apply 1 application topically 2 (two) times daily as needed for itching.     [provider]  irbesartan (AVAPRO) 75 MG tablet TAKE 1 TABLET BY MOUTH EVERY DAY 01/01/22   Hilty, Nadean Corwin, MD  oseltamivir (TAMIFLU) 75 MG capsule Take 1 capsule (75 mg total) by mouth 2 (two) times daily  for 5 days. 10/31/22 11/05/22  [place] Donnamarie Poag, DO  polyethylene glycol (MIRALAX / GLYCOLAX) packet Take 17 g by mouth 2 (two) times daily. 06/15/18   Raiford Noble Latif, DO  saw palmetto 80 MG capsule Take 80 mg by mouth 2 (two) times daily.    [provider]  tadalafil (CIALIS) 10 MG tablet Take 10 mg by mouth daily as needed for erectile dysfunction.     [provider]  traMADol (ULTRAM) 50 MG tablet Take 1 tablet (50 mg total) by mouth every 6 (six) hours as needed. 11/14/21   Couture, Cortni S, PA-C    Family History    Family History  Problem Relation Age of Onset   Coronary artery disease Father 32       MI/smoker  Colon cancer Brother    Ovarian cancer Sister    COPD Sister    He indicated that his mother is deceased. He indicated that his father is deceased. He indicated that one of his three sisters is deceased. He indicated that one of his two brothers is deceased.  Social History    Social History   Socioeconomic History   Marital status: Married    Spouse name: Not on file   Number of children: 4   Years of education: Not on file   Highest education level: Not on file  Occupational History   Occupation: Psychologist  Tobacco Use   Smoking status: Never   Smokeless tobacco: Never  Vaping Use   Vaping Use: Never used  Substance and Sexual Activity   Alcohol use: Not Currently    Alcohol/week: 1.0 - 2.0 standard drink of alcohol    Types: 1 - 2 Standard drinks or equivalent per week    Comment: SOCIAL    Drug use: Never   Sexual activity: Yes  Other Topics Concern   Not on file  Social History Narrative   Retired Arts development officer   Social Determinants of Radio broadcast assistant Strain: Not on file  Food Insecurity: Not on file  Transportation Needs: Not on file  Physical Activity: Not on file  Stress: Not on file  Social Connections: Not on file  Intimate Partner Violence: Not on file     Review of Systems    General:   No chills, fever, night sweats or weight changes.  Cardiovascular:  No chest pain, dyspnea on exertion, edema, orthopnea, palpitations, paroxysmal nocturnal dyspnea. Dermatological: No rash, lesions/masses Respiratory: No cough, dyspnea Urologic: No hematuria, dysuria Abdominal:   No nausea, vomiting, diarrhea, bright red blood per rectum, melena, or hematemesis Neurologic:  No visual changes, wkns, changes in mental status. All other systems reviewed and are otherwise negative except as noted above.  Physical Exam    VS:  BP 118/66   Pulse 72   Ht '5\' 6"'$  (1.676 m)   Wt 213 lb (96.6 kg)   SpO2 94%   BMI 34.38 kg/m  , BMI Body mass index is 34.38 kg/m. GEN: Well nourished, well developed, in no acute distress. HEENT: normal. Neck: Supple, no JVD, carotid bruits, or masses. Cardiac: RRR, no murmurs, rubs, or gallops. No clubbing, cyanosis, edema.  Radials/DP/PT 2+ and equal bilaterally.  Respiratory:  Respirations regular and unlabored, clear to auscultation bilaterally. GI: Soft, nontender, nondistended, BS + x 4. MS: no deformity or atrophy. Skin: warm and dry, no rash. Neuro:  Strength and sensation are intact. Psych: Normal affect.  Accessory Clinical Findings    Recent Labs: 10/30/2022: ALT 20; Magnesium 2.1 10/31/2022: BUN 8; Creatinine, Ser 0.73; Hemoglobin 11.5; Platelets 181; Potassium 3.3; Sodium 131   Recent Lipid Panel    Component Value Date/Time   CHOL 146 01/07/2019 1102   TRIG 64 01/07/2019 1102   HDL 68 01/07/2019 1102   CHOLHDL 2.1 01/07/2019 1102   LDLCALC 65 01/07/2019 1102         ECG personally reviewed by me today-none today.  Echocardiogram 11/22/2021 IMPRESSIONS     1. Left ventricular ejection fraction, by estimation, is 55 to 60%. Left  ventricular ejection fraction by 3D volume is 56 %. The left ventricle has  normal function. The left ventricle has no regional wall motion  abnormalities. Left ventricular diastolic   parameters were  normal. The average left ventricular global longitudinal  strain is -24.8 %. The global longitudinal strain is normal.   2. Normal RV free wall strain at -24.6%. Right ventricular systolic  function is low normal. The right ventricular size is normal. There is  normal pulmonary artery systolic pressure. The estimated right ventricular  systolic pressure is 0000000 mmHg.   3. Left atrial size was mildly dilated.   4. The mitral valve is abnormal. Mild mitral valve regurgitation.  Moderate mitral annular calcification.   5. The aortic valve has been repaired/replaced. Aortic valve  regurgitation is not visualized. There is a 21 mm [facility] Ease  bioprosthetic valve present in the aortic position. Procedure Date:  01/17/12. Echo findings are consistent with normal  structure and function of the aortic valve prosthesis. Aortic valve mean  gradient measures 7.0 mmHg. Peak gradient 12.4 mmHg.   6. The inferior vena cava is normal in size with greater than 50%  respiratory variability, suggesting right atrial pressure of 3 mmHg.   Comparison(s): No significant change from prior study. 07/13/20: LVEF  60-65%, AV 86mHg mean PG, 189mg peak PG.   FINDINGS   Left Ventricle: Left ventricular ejection fraction, by estimation, is 55  to 60%. Left ventricular ejection fraction by 3D volume is 56 %. The left  ventricle has normal function. The left ventricle has no regional wall  motion abnormalities. The average  left ventricular global longitudinal strain is -24.8 %. The global  longitudinal strain is normal. The left ventricular internal cavity size  was normal in size. There is no left ventricular hypertrophy. Left  ventricular diastolic parameters were normal.   Right Ventricle: Normal RV free wall strain at -24.6%. The right  ventricular size is normal. No increase in right ventricular wall  thickness. Right ventricular systolic function is low normal. There is  normal pulmonary artery  systolic pressure. The  tricuspid regurgitant velocity is 2.54 m/s, and with an assumed right  atrial pressure of 3 mmHg, the estimated right ventricular systolic  pressure is 280000000mHg.   Left Atrium: Left atrial size was mildly dilated.   Right Atrium: Right atrial size was normal in size.   Pericardium: There is no evidence of pericardial effusion.   Mitral Valve: The mitral valve is abnormal. There is moderate  calcification of the posterior and anterior mitral valve leaflet(s).  Moderate mitral annular calcification. Mild mitral valve regurgitation.   Tricuspid Valve: The tricuspid valve is grossly normal. Tricuspid valve  regurgitation is trivial.   Aortic Valve: The aortic valve has been repaired/replaced. Aortic valve  regurgitation is not visualized. Aortic valve mean gradient measures 7.0  mmHg. Aortic valve peak gradient measures 12.4 mmHg. There is a 21 mm  [facility]  present in the aortic position. Procedure Date: 01/17/12. Echo findings are  consistent with normal structure and function of the aortic valve  prosthesis.   Pulmonic Valve: The pulmonic valve was grossly normal. Pulmonic valve  regurgitation is not visualized.   Aorta: The aortic root and ascending aorta are structurally normal, with  no evidence of dilitation.   Venous: The inferior vena cava is normal in size with greater than 50%  respiratory variability, suggesting right atrial pressure of 3 mmHg.   IAS/Shunts: No atrial level shunt detected by color flow Doppler.    Carotid ultrasound 11/30/2021 Summary:  Right Carotid: Velocities in the right ICA are consistent with a 1-39%  stenosis.   Left Carotid: Velocities in the left ICA are consistent with a 40-59%  stenosis.  Vertebrals: Bilateral vertebral arteries demonstrate antegrade flow.  Subclavians: Normal flow hemodynamics were seen in bilateral subclavian               arteries.   *See table(s) above for  measurements and observations.  Suggest follow up study in 12 months.   Assessment & Plan   1.  Presyncope/syncope-no further episodes since being in the emergency department.  Noted to be positive for flu A.  Also noted to be hyponatremic.  Received supplemental calcium and potassium.  Felt to be vasovagal in the setting of viral illness.  Question of cardiac arrhythmia. Cont. to monitor-deferred cardiac monitor  Carotid stenosis-right ICA 1-39% stenosis, left ICA 40-59% stenosis on 3/23. Continue atorvastatin Heart healthy low-sodium high-fiber diet Repeat carotid arterial duplex   Aortic stenosis-status post aortic valve replacement 2013.  Has noted some increased shortness of breath which appears to be related to flu a infection.  Chest x-ray reassuring. Repeat echocardiogram  Essential hypertension-BP today 118/66 Continue irbesartan Heart healthy low-sodium diet-salty 6 given Increase physical activity as tolerated  Hyperlipidemia-LDL 76 on 06/27/22 Heart healthy low-sodium high-fiber diet Continue atorvastatin  Disposition: Follow-up with Dr. Debara Pickett or me after echocardiogram/cardiac event monitor   Jossie Ng. Watt Geiler NP-C     11/02/2022, 8:47 AM [place] [address] Office [phone] Fax [phone]    I spent 14 minutes examining this patient, reviewing medications, and using patient centered shared decision making involving her cardiac care.  Prior to her visit I spent greater than 20 minutes reviewing her past medical history,  medications, and prior cardiac tests.

## 2022-11-02 NOTE — Patient Instructions (Signed)
Medication Instructions:  The current medical regimen is effective;  continue present plan and medications as directed. Please refer to the Current Medication list given to you today. *If you need a refill on your cardiac medications before your next appointment, please call your pharmacy*  Lab Work: NONE If you have labs (blood work) drawn today and your tests are completely normal, you will receive your results only by: [place] (if you have MyChart) OR  A paper copy in the mail If you have any lab test that is abnormal or we need to change your treatment, we will call you to review the results.  Testing/Procedures: Your physician has requested that you have a carotid duplex. This test is an ultrasound of the carotid arteries in your neck. It looks at blood flow through these arteries that supply the brain with blood. Allow one hour for this exam. There are no restrictions or special instructions.  Echocardiogram - Your physician has requested that you have an echocardiogram. Echocardiography is a painless test that uses sound waves to create images of your heart. It provides your doctor with information about the size and shape of your heart and how well your heart's chambers and valves are working. This procedure takes approximately one hour. There are no restrictions for this procedure.   Follow-Up: At [facility], you and your health needs are our priority.  As part of our continuing mission to provide you with exceptional heart care, we have created designated Provider Care Teams.  These Care Teams include your primary Cardiologist (physician) and Advanced Practice Providers (APPs -  Physician Assistants and Nurse Practitioners) who all work together to provide you with the care you need, when   Your next appointment:   KEEP SCHEDULED   Provider:   Pixie Casino, MD     Other Instructions

## 2022-11-07 ENCOUNTER — Ambulatory Visit (HOSPITAL_COMMUNITY)
Admission: RE | Admit: 2022-11-07 | Discharge: 2022-11-07 | Disposition: A | Discharge status: Home / Self Care | Payer: Medicare Other | Source: Ambulatory Visit | Attending: Internal Medicine | Admitting: Internal Medicine

## 2022-11-07 DIAGNOSIS — I6523 Occlusion and stenosis of bilateral carotid arteries: Secondary | ICD-10-CM | POA: Insufficient documentation

## 2022-11-12 ENCOUNTER — Other Ambulatory Visit: Payer: Self-pay

## 2022-11-12 DIAGNOSIS — I6523 Occlusion and stenosis of bilateral carotid arteries: Secondary | ICD-10-CM

## 2022-12-04 ENCOUNTER — Ambulatory Visit (HOSPITAL_COMMUNITY): Payer: Medicare Other | Attending: Cardiology

## 2022-12-04 DIAGNOSIS — R55 Syncope and collapse: Secondary | ICD-10-CM | POA: Diagnosis present

## 2022-12-04 DIAGNOSIS — I6523 Occlusion and stenosis of bilateral carotid arteries: Secondary | ICD-10-CM

## 2022-12-04 LAB — ECHOCARDIOGRAM COMPLETE
AV Mean grad: 7.2 mmHg
AV Peak grad: 12.1 mmHg
Ao pk vel: 1.74 m/s
Area-P 1/2: 3.24 cm2
S' Lateral: 3.1 cm

## 2023-01-02 ENCOUNTER — Ambulatory Visit: Payer: Medicare Other | Attending: Internal Medicine | Admitting: Internal Medicine

## 2023-01-02 ENCOUNTER — Encounter: Payer: Self-pay | Admitting: Internal Medicine

## 2023-01-02 VITALS — BP 132/68 | HR 78 | Ht 65.0 in | Wt 217.4 lb

## 2023-01-02 DIAGNOSIS — I10 Essential (primary) hypertension: Secondary | ICD-10-CM

## 2023-01-02 DIAGNOSIS — Z952 Presence of prosthetic heart valve: Secondary | ICD-10-CM | POA: Diagnosis not present

## 2023-01-02 DIAGNOSIS — R55 Syncope and collapse: Secondary | ICD-10-CM

## 2023-01-02 DIAGNOSIS — I6523 Occlusion and stenosis of bilateral carotid arteries: Secondary | ICD-10-CM | POA: Diagnosis not present
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
## 2023-01-02 NOTE — Patient Instructions (Signed)
Medication Instructions:  NO CHANGES  *If you need a refill on your cardiac medications before your next appointment, please call your pharmacy*   Follow-Up: At [facility], you and your health needs are our priority.  As part of our continuing mission to provide you with exceptional heart care, we have created designated Provider Care Teams.  These Care Teams include your primary Cardiologist (physician) and Advanced Practice Providers (APPs -  Physician Assistants and Nurse Practitioners) who all work together to provide you with the care you need, when you need it.  We recommend signing up for the patient portal called "MyChart".  Sign up information is provided on this After Visit Summary.  MyChart is used to connect with patients for Virtual Visits (Telemedicine).  Patients are able to view lab/test results, encounter notes, upcoming appointments, etc.  Non-urgent messages can be sent to your provider as well.   To learn more about what you can do with MyChart, go to [email or web address].    Your next appointment:    12 months with Dr. Hilty  

## 2024-01-02 ENCOUNTER — Ambulatory Visit: Payer: Medicare Other | Admitting: Adult Health

## 2024-01-08 ENCOUNTER — Ambulatory Visit: Attending: Emergency Medicine | Admitting: Emergency Medicine

## 2024-01-08 ENCOUNTER — Encounter: Payer: Self-pay | Admitting: Emergency Medicine

## 2024-01-08 VITALS — BP 116/60 | HR 66 | Ht 66.0 in | Wt 216.0 lb

## 2024-01-08 DIAGNOSIS — I6523 Occlusion and stenosis of bilateral carotid arteries: Secondary | ICD-10-CM | POA: Diagnosis not present

## 2024-01-08 DIAGNOSIS — E782 Mixed hyperlipidemia: Secondary | ICD-10-CM

## 2024-01-08 DIAGNOSIS — Z952 Presence of prosthetic heart valve: Secondary | ICD-10-CM | POA: Diagnosis not present

## 2024-01-08 DIAGNOSIS — I350 Nonrheumatic aortic (valve) stenosis: Secondary | ICD-10-CM

## 2024-01-08 DIAGNOSIS — I10 Essential (primary) hypertension: Secondary | ICD-10-CM | POA: Diagnosis not present

## 2024-01-08 NOTE — Progress Notes (Signed)
 " Cardiology Office Note:    Date:  01/08/2024  ID:  Andrew Bonilla, DOB [birth year], MRN [id number] PCP: Ransom Other, MD  [facility] Providers Cardiologist:  Vinie JAYSON Maxcy, MD       Patient Profile:      Chief Complaint: 1 year follow-up for aortic stenosis s/p AVR History of Present Illness:  Andrew Bonilla is a 89 y.o. male with visit-pertinent history of hypertension, near syncope, hypocalcemia, coronary disease, aortic stenosis s/p aortic valve replacement in 2013, hypertension  He has history of severe aortic stenosis and underwent [facility] Ease pericardial tissue valve placement in the aortic position Jan 16, 2012.   Patient was seen in the hospital in 10/2022 for syncope and collapse.  He returned from [place]  apparently had coughing and had contracted influenza A.  He was also dehydrated and likely some combination of factors led to him passing out.  He was seen by Josefa, NP afterwards and had repeat echo and carotid Dopplers.  Carotid Doppler showed mild bilateral carotid artery disease.  Echocardiogram showed LVEF 60 to 65%, no RWMA, RV function normal, mild MR, moderate MAC, with normal function of pericardial valve with no aortic stenosis or regurgitation.  Patient was last seen in clinic on 01/02/2023.  He had no further syncopal episodes.  His medication management was continued he was to follow-up in 1 year.   Discussed the use of AI scribe software for clinical note transcription with the patient, who gave verbal consent to proceed.  History of Present Illness Andrew Bonilla is an 89 year old male who presents to office today with his wife for his 1 year follow-up.  Today patient is without any acute cardiovascular concerns or complaints.  He notes that he is doing well overall.  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  He stays active within the community and stays in touch with many friends and family.  He does note that he does experience some  very mild fatigue on physical activity.  He notes this due to overall general deconditioning and knee pain.  He does deny any exertional symptoms.  No exertional chest pains or dyspnea.  He is without any lightheadedness, dizziness, syncope, presyncope, falls.  He notes over the past year or so he has not been as active as he would like as it is a hard for him to find time for physical activity.  He notes he is a part of [employer] and plans to workout to improve his general conditioning.  He has a second home in [place].  He is leaving in the next few weeks to spend 4 months in [place].  Review of systems:  Please see the history of present illness. All other systems are reviewed and otherwise negative.     Home Medications:    Current Meds  Medication Sig   aspirin  EC 81 MG tablet Take 81 mg by mouth daily.   atorvastatin  (LIPITOR) 40 MG tablet TAKE 1 TABLET BY MOUTH EVERY DAY   esomeprazole (NEXIUM) 40 MG capsule Take 40 mg by mouth daily at 12 noon.    hydrocortisone cream 1 % Apply 1 application topically 2 (two) times daily as needed for itching.    irbesartan  (AVAPRO ) 75 MG tablet TAKE 1 TABLET BY MOUTH EVERY DAY   loratadine (CLARITIN) 10 MG tablet Take 10 mg by mouth.   polyethylene glycol (MIRALAX  / GLYCOLAX ) packet Take 17 g by mouth 2 (two) times  daily.   predniSONE  (DELTASONE ) 10 MG tablet Take by mouth.   saw palmetto 80 MG capsule Take 80 mg by mouth 2 (two) times daily.   tadalafil  (CIALIS ) 10 MG tablet Take 10 mg by mouth daily as needed for erectile dysfunction.    Studies Reviewed:   EKG Interpretation Date/Time:  Wednesday Jan 08 2024 15:41:23 EDT Ventricular Rate:  66 PR Interval:  184 QRS Duration:  106 QT Interval:  378 QTC Calculation: 396 R Axis:   -19  Text Interpretation: Normal sinus rhythm Nonspecific ST and T wave abnormality When compared with ECG of 30-Oct-2022 18:25, PREVIOUS ECG IS PRESENT Confirmed by Rana Dixon [phone]) on 01/08/2024 4:41:28  PM    Echocardiogram 12/04/2022 1. Left ventricular ejection fraction, by estimation, is 60 to 65%. The  left ventricle has normal function. The left ventricle has no regional  wall motion abnormalities. Left ventricular diastolic parameters are  indeterminate.   2. Right ventricular systolic function is normal. The right ventricular  size is mildly enlarged. Tricuspid regurgitation signal is inadequate for  assessing PA pressure.   3. The mitral valve is degenerative. Mild mitral valve regurgitation. No  evidence of mitral stenosis. Moderate mitral annular calcification.   4. The aortic valve has been repaired/replaced. Aortic valve  regurgitation is not visualized. No aortic stenosis is present. There is a  21 mm Magna Ease pericardial valve present in the aortic position. Aortic  valve mean gradient measures 7.2 mmHg.  Aortic valve Vmax measures 1.74 m/s. DVI 0.55.  Risk Assessment/Calculations:             Physical Exam:   VS:  BP 116/60   Pulse 66   Ht 5' 6 (1.676 m)   Wt 216 lb (98 kg)   SpO2 96%   BMI 34.86 kg/m    Wt Readings from Last 3 Encounters:  01/08/24 216 lb (98 kg)  01/02/23 217 lb 6.4 oz (98.6 kg)  11/02/22 213 lb (96.6 kg)    GEN: Well nourished, well developed in no acute distress NECK: No JVD; No carotid bruits CARDIAC: RRR, no murmurs, rubs, gallops RESPIRATORY:  Clear to auscultation without rales, wheezing or rhonchi  ABDOMEN: Soft, non-tender, non-distended EXTREMITIES:  No edema; No acute deformity     Assessment and Plan:  Severe aortic stenosis s/p AVR status post [facility] Ease 21 mm tissue valve in 2013  Echocardiogram 12/2022 with normal function of aortic valve replacement - Today he remains asymptomatic without CP, SOB, DOE, syncope.  Remains moderately active without exertional symptoms - Routine echocardiogram ordered today for surveillance of AVR - Continue aspirin  81 mg daily  Hypertension Blood pressure today is 116/60 and under  adequate control - Continue irbesartan  75 mg daily - Continue BP monitoring at home  Bilateral carotid artery disease Carotid duplex bilateral 11/2022 showing right ICA 1-39% stenosis and left ICA 40-59% stenosis unchanged from prior study 11/30/2021 - Remains entirely asymptomatic without lightheadedness, dizziness, syncope, vision deficits, TIA symptoms - Recommend repeat carotid US  in 01/2025 - Continue atorvastatin  40 mg daily and aspirin  81 mg daily  Hyperlipidemia LDL 70 on 06/2023 LDL currently at goal of 70 or less - Continue atorvastatin  40 mg daily     Dispo:  Return in about 1 year (around 01/07/2025).  Signed, Dixon LITTIE Rana, NP  "

## 2024-01-08 NOTE — Patient Instructions (Signed)
 Medication Instructions:  NO CHANGES  Lab Work: NONE  Testing/Procedures: Your physician has requested that you have an echocardiogram. Echocardiography is a painless test that uses sound waves to create images of your heart. It provides your doctor with information about the size and shape of your heart and how well your heart's chambers and valves are working. This procedure takes approximately one hour. There are no restrictions for this procedure. Please do NOT wear cologne, perfume, aftershave, or lotions (deodorant is allowed). Please arrive 15 minutes prior to your appointment time.  Please note: We ask at that you not bring children with you during ultrasound (echo/ vascular) testing. Due to room size and safety concerns, children are not allowed in the ultrasound rooms during exams. Our front office staff cannot provide observation of children in our lobby area while testing is being conducted. An adult accompanying a patient to their appointment will only be allowed in the ultrasound room at the discretion of the ultrasound technician under special circumstances. We apologize for any inconvenience.   Follow-Up: At [facility], you and your health needs are our priority.  As part of our continuing mission to provide you with exceptional heart care, our providers are all part of one team.  This team includes your primary Cardiologist (physician) and Advanced Practice Providers or APPs (Physician Assistants and Nurse Practitioners) who all work together to provide you with the care you need, when you need it.  Your next appointment:   1 YEAR  Provider:   Hazle Lites, MD

## 2024-01-09 ENCOUNTER — Encounter: Payer: Self-pay | Admitting: Emergency Medicine

## 2024-05-12 ENCOUNTER — Other Ambulatory Visit (HOSPITAL_COMMUNITY)

## 2024-06-03 ENCOUNTER — Telehealth: Payer: Self-pay | Admitting: Internal Medicine

## 2024-06-03 ENCOUNTER — Ambulatory Visit (HOSPITAL_COMMUNITY)
Admission: RE | Admit: 2024-06-03 | Discharge: 2024-06-03 | Disposition: A | Discharge status: Home / Self Care | Source: Ambulatory Visit | Attending: Cardiovascular Disease | Admitting: Cardiovascular Disease

## 2024-06-03 DIAGNOSIS — Z952 Presence of prosthetic heart valve: Secondary | ICD-10-CM | POA: Insufficient documentation

## 2024-06-03 DIAGNOSIS — I10 Essential (primary) hypertension: Secondary | ICD-10-CM | POA: Diagnosis present

## 2024-06-03 DIAGNOSIS — E782 Mixed hyperlipidemia: Secondary | ICD-10-CM | POA: Insufficient documentation

## 2024-06-03 LAB — ECHOCARDIOGRAM COMPLETE
AR max vel: 1.78 cm2
AV Area VTI: 1.76 cm2
AV Area mean vel: 1.76 cm2
AV Mean grad: 6.5 mmHg
AV Peak grad: 12.3 mmHg
Ao pk vel: 1.75 m/s
S' Lateral: 2.85 cm

## 2024-06-03 NOTE — Telephone Encounter (Signed)
 Pt dropped off Handi-cap sticker paperwork to be filled out by Dr. Mona. Pt would like a call to come back and pick up once its completed. [phone]. Thank you  Location: Dr. Mona mailbox

## 2024-06-04 ENCOUNTER — Ambulatory Visit: Payer: Self-pay | Admitting: Emergency Medicine

## 2024-06-04 NOTE — Telephone Encounter (Signed)
 Left message letting pt know he can pick up his handicap placard. Call back number left.

## 2024-06-05 NOTE — Telephone Encounter (Signed)
 Pt returning call. Please advise.

## 2024-11-19 ENCOUNTER — Ambulatory Visit: Admitting: Internal Medicine
